# Patient Record
Sex: Female | Born: 1985 | Race: Black or African American | Hispanic: No | Marital: Single | Smoking: Former smoker
Health system: Southern US, Community
[De-identification: ages and names within clinical notes are randomized; demographics above are authoritative.]

## PROBLEM LIST (undated history)

## (undated) ENCOUNTER — Inpatient Hospital Stay (HOSPITAL_COMMUNITY): Payer: Self-pay

## (undated) DIAGNOSIS — R519 Headache, unspecified: Secondary | ICD-10-CM

## (undated) DIAGNOSIS — Z3480 Encounter for supervision of other normal pregnancy, unspecified trimester: Secondary | ICD-10-CM

## (undated) DIAGNOSIS — R51 Headache: Secondary | ICD-10-CM

## (undated) DIAGNOSIS — O24419 Gestational diabetes mellitus in pregnancy, unspecified control: Secondary | ICD-10-CM

## (undated) DIAGNOSIS — K219 Gastro-esophageal reflux disease without esophagitis: Secondary | ICD-10-CM

## (undated) DIAGNOSIS — K469 Unspecified abdominal hernia without obstruction or gangrene: Secondary | ICD-10-CM

## (undated) DIAGNOSIS — B009 Herpesviral infection, unspecified: Secondary | ICD-10-CM

## (undated) HISTORY — DX: Herpesviral infection, unspecified: B00.9

## (undated) HISTORY — DX: Unspecified abdominal hernia without obstruction or gangrene: K46.9

## (undated) HISTORY — DX: Gestational diabetes mellitus in pregnancy, unspecified control: O24.419

## (undated) HISTORY — PX: WISDOM TOOTH EXTRACTION: SHX21

## (undated) HISTORY — DX: Encounter for supervision of other normal pregnancy, unspecified trimester: Z34.80

---
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
## 2005-04-07 ENCOUNTER — Inpatient Hospital Stay (HOSPITAL_COMMUNITY): Admission: AD | Admit: 2005-04-07 | Discharge: 2005-04-10 | Payer: Self-pay | Admitting: Obstetrics

## 2005-04-09 ENCOUNTER — Encounter (INDEPENDENT_AMBULATORY_CARE_PROVIDER_SITE_OTHER): Payer: Self-pay | Admitting: Specialist

## 2005-06-01 ENCOUNTER — Inpatient Hospital Stay (HOSPITAL_COMMUNITY): Admission: AD | Admit: 2005-06-01 | Discharge: 2005-06-01 | Payer: Self-pay | Admitting: Obstetrics and Gynecology

## 2005-06-02 ENCOUNTER — Inpatient Hospital Stay (HOSPITAL_COMMUNITY): Admission: AD | Admit: 2005-06-02 | Discharge: 2005-06-02 | Payer: Self-pay | Admitting: *Deleted

## 2006-04-20 ENCOUNTER — Inpatient Hospital Stay (HOSPITAL_COMMUNITY): Admission: AD | Admit: 2006-04-20 | Discharge: 2006-04-21 | Payer: Self-pay | Admitting: Obstetrics & Gynecology

## 2006-05-07 ENCOUNTER — Ambulatory Visit (HOSPITAL_COMMUNITY): Admission: RE | Admit: 2006-05-07 | Discharge: 2006-05-07 | Payer: Self-pay | Admitting: Obstetrics & Gynecology

## 2006-06-07 ENCOUNTER — Inpatient Hospital Stay (HOSPITAL_COMMUNITY): Admission: AD | Admit: 2006-06-07 | Discharge: 2006-06-07 | Payer: Self-pay | Admitting: Obstetrics

## 2006-06-07 ENCOUNTER — Inpatient Hospital Stay (HOSPITAL_COMMUNITY): Admission: AD | Admit: 2006-06-07 | Discharge: 2006-06-08 | Payer: Self-pay | Admitting: Obstetrics

## 2006-07-13 ENCOUNTER — Inpatient Hospital Stay (HOSPITAL_COMMUNITY): Admission: AD | Admit: 2006-07-13 | Discharge: 2006-07-13 | Payer: Self-pay | Admitting: Obstetrics

## 2006-08-01 ENCOUNTER — Inpatient Hospital Stay (HOSPITAL_COMMUNITY): Admission: AD | Admit: 2006-08-01 | Discharge: 2006-08-09 | Payer: Self-pay | Admitting: Gynecology

## 2006-11-30 ENCOUNTER — Inpatient Hospital Stay (HOSPITAL_COMMUNITY): Admission: AD | Admit: 2006-11-30 | Discharge: 2006-12-03 | Payer: Self-pay | Admitting: Obstetrics

## 2006-11-30 ENCOUNTER — Encounter: Payer: Self-pay | Admitting: Emergency Medicine

## 2006-12-05 ENCOUNTER — Ambulatory Visit (HOSPITAL_BASED_OUTPATIENT_CLINIC_OR_DEPARTMENT_OTHER): Admission: RE | Admit: 2006-12-05 | Discharge: 2006-12-05 | Payer: Self-pay | Admitting: Orthopedic Surgery

## 2006-12-18 HISTORY — PX: FOREARM FRACTURE SURGERY: SHX649

## 2007-09-18 ENCOUNTER — Emergency Department (HOSPITAL_COMMUNITY): Admission: EM | Admit: 2007-09-18 | Discharge: 2007-09-18 | Payer: Self-pay | Admitting: Family Medicine

## 2008-04-10 ENCOUNTER — Emergency Department (HOSPITAL_COMMUNITY): Admission: EM | Admit: 2008-04-10 | Discharge: 2008-04-10 | Payer: Self-pay | Admitting: Emergency Medicine

## 2008-07-10 ENCOUNTER — Inpatient Hospital Stay (HOSPITAL_COMMUNITY): Admission: AD | Admit: 2008-07-10 | Discharge: 2008-07-10 | Payer: Self-pay | Admitting: Gynecology

## 2008-07-13 ENCOUNTER — Inpatient Hospital Stay (HOSPITAL_COMMUNITY): Admission: AD | Admit: 2008-07-13 | Discharge: 2008-07-13 | Payer: Self-pay | Admitting: Family Medicine

## 2008-09-07 ENCOUNTER — Inpatient Hospital Stay (HOSPITAL_COMMUNITY): Admission: AD | Admit: 2008-09-07 | Discharge: 2008-09-07 | Payer: Self-pay | Admitting: Obstetrics

## 2008-09-22 ENCOUNTER — Emergency Department (HOSPITAL_COMMUNITY): Admission: EM | Admit: 2008-09-22 | Discharge: 2008-09-22 | Payer: Self-pay | Admitting: Emergency Medicine

## 2008-11-20 ENCOUNTER — Inpatient Hospital Stay (HOSPITAL_COMMUNITY): Admission: AD | Admit: 2008-11-20 | Discharge: 2008-11-20 | Payer: Self-pay | Admitting: Obstetrics

## 2009-02-09 ENCOUNTER — Inpatient Hospital Stay (HOSPITAL_COMMUNITY): Admission: AD | Admit: 2009-02-09 | Discharge: 2009-02-09 | Payer: Self-pay | Admitting: Obstetrics

## 2009-02-13 ENCOUNTER — Inpatient Hospital Stay (HOSPITAL_COMMUNITY): Admission: AD | Admit: 2009-02-13 | Discharge: 2009-02-13 | Payer: Self-pay | Admitting: Obstetrics

## 2009-03-11 ENCOUNTER — Inpatient Hospital Stay (HOSPITAL_COMMUNITY): Admission: RE | Admit: 2009-03-11 | Discharge: 2009-03-13 | Payer: Self-pay | Admitting: Obstetrics

## 2009-10-24 ENCOUNTER — Emergency Department (HOSPITAL_COMMUNITY): Admission: EM | Admit: 2009-10-24 | Discharge: 2009-10-24 | Payer: Self-pay | Admitting: Emergency Medicine

## 2009-12-18 DIAGNOSIS — K469 Unspecified abdominal hernia without obstruction or gangrene: Secondary | ICD-10-CM

## 2009-12-18 HISTORY — DX: Unspecified abdominal hernia without obstruction or gangrene: K46.9

## 2011-03-08 ENCOUNTER — Encounter: Payer: Self-pay | Admitting: Licensed Clinical Social Worker

## 2011-03-30 LAB — CCBB MATERNAL DONOR DRAW

## 2011-03-30 LAB — CBC
HCT: 29.1 % — ABNORMAL LOW (ref 36.0–46.0)
HCT: 32.4 % — ABNORMAL LOW (ref 36.0–46.0)
Hemoglobin: 10.6 g/dL — ABNORMAL LOW (ref 12.0–15.0)
Hemoglobin: 9.4 g/dL — ABNORMAL LOW (ref 12.0–15.0)
MCHC: 32.3 g/dL (ref 30.0–36.0)
MCHC: 32.6 g/dL (ref 30.0–36.0)
MCV: 87.2 fL (ref 78.0–100.0)
MCV: 88.2 fL (ref 78.0–100.0)
Platelets: 152 10*3/uL (ref 150–400)
Platelets: 201 10*3/uL (ref 150–400)
RBC: 3.3 MIL/uL — ABNORMAL LOW (ref 3.87–5.11)
RBC: 3.72 MIL/uL — ABNORMAL LOW (ref 3.87–5.11)
RDW: 13.2 % (ref 11.5–15.5)
RDW: 13.3 % (ref 11.5–15.5)
WBC: 8.9 10*3/uL (ref 4.0–10.5)
WBC: 9.2 10*3/uL (ref 4.0–10.5)

## 2011-03-30 LAB — RPR: RPR Ser Ql: NONREACTIVE

## 2011-04-04 LAB — URINALYSIS, ROUTINE W REFLEX MICROSCOPIC
Bilirubin Urine: NEGATIVE
Glucose, UA: NEGATIVE mg/dL
Hgb urine dipstick: NEGATIVE
Ketones, ur: NEGATIVE mg/dL
Nitrite: NEGATIVE
Protein, ur: NEGATIVE mg/dL
Specific Gravity, Urine: 1.01 (ref 1.005–1.030)
Urobilinogen, UA: 0.2 mg/dL (ref 0.0–1.0)
pH: 6 (ref 5.0–8.0)

## 2011-05-05 NOTE — Discharge Summary (Signed)
[name], [name] NO.:  [id number]   MEDICAL RECORD NO.:  [id number]          PATIENT TYPE:  INP   LOCATION:  9307                          FACILITY:  [facility]   PHYSICIAN:  Kathreen Cosier, M.D.DATE OF BIRTH:  [birth year]   DATE OF ADMISSION:  08/01/2006  DATE OF DISCHARGE:  08/09/2006                                 DISCHARGE SUMMARY   The patient is 25 year old gravida 3, para 2-0-0-2, EDC December 22, 2006, [redacted]  weeks pregnant, with a history of pyelonephritis.  She was admitted with a  temperature of 101,  pulse 120, and CVA tenderness.  Urine was positive.  She was treated with IV ampicillin and gentamicin.  Blood cultures grew out  gram-negative rods, E. coli.  The patient received IV ampicillin and  gentamicin until August 23, when she was discharged home on Cipro 500 mg  q.12h.  On admission the hemoglobin was 10.3, white count 11.6, platelets  226.  Repeat white count 8.7, hemoglobin 8.6.  Sodium 135, potassium 3.9,  chloride 102, glucose 83, creatinine 0.6.  Hepatitis negative.  RPR  negative.   DISCHARGE DIAGNOSES:  1. Status post intrauterine pregnancy at 19 weeks.  2. Pyelonephritis.  3. Positive blood culture.           ______________________________  Kathreen Cosier, M.D.     BAM/MEDQ  D:  08/29/2006  T:  08/29/2006  Job:  [id number]
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
## 2011-05-05 NOTE — Discharge Summary (Signed)
   NAME:  Leah Walls, Leah Walls                          ACCOUNT NO.:  [id number]   MEDICAL RECORD NO.:  [id number]                   PATIENT TYPE:  INP   LOCATION:  9138                                 FACILITY:  [facility]   PHYSICIAN:  Maurice March, M.D.                  DATE OF BIRTH:  [birth year]   DATE OF ADMISSION:  01/31/2003  DATE OF DISCHARGE:  02/04/2003                                 DISCHARGE SUMMARY   ADMISSION DIAGNOSES:  1. The patient is a 25 year old gravida 1 at 40-4/7 weeks with spontaneous     rupture of membranes.  2. Group B Streptococcus positive.   DISCHARGE DIAGNOSES:  1. The patient is a 25 year old gravida 1 para 1 on postoperative day #3     status post low transverse cesarean section for failure to dilate.  2. Anemia.  3. Viable female with Apgars of 2 at 1 minute and 9 at 5 minutes.   DISCHARGE MEDICATIONS:  1. Ibuprofen 600 mg p.o. q.6h. p.r.n.  2. Percocet 5/325 mg 1 to 2 tablets p.o. q.4-6h. p.r.n.  3. Ortho Evra patch 1 patch applied weekly as directed.  4. Iron sulfate 325 mg p.o. b.i.d.  5. Colace 100 mg p.o. daily.   HISTORY:  The patient is a 25 year old G1 who presented at 40-4/7 weeks with  spontaneous rupture of membranes. She was noted to be GBS positive.  She was  admitted to labor and delivery and started on penicillin for GBS  prophylaxis.   HOSPITAL COURSE:  She had a very slow labor and dilated to 4 to 5 cm.  She  then had arrest of dilatation and was taken for a low transverse cesarean  section.  She delivered a viable female with Apgars of 2 at 1 minute and 9 at  5 minutes.  Cord pH was 7.3.   The patient had a normal postpartum course.  She was noted to be anemic  postpartum with a hemoglobin of 7.0.  She was started on iron for the  remainder of her hospital stay.   Her female infant was circumcised before discharge.  She requested the Ortho  Evra patch for contraception.    DISCHARGE INSTRUCTIONS:  1. The patient was told of her above  medical regimen.  2. She was also told of her followup appointment at [facility] in six     weeks.   CONDITION ON DISCHARGE:  The patient was discharged to home in stable  condition.                                                 Maurice March, M.D.    LC/MEDQ  D:  02/04/2003  T:  02/04/2003  Job:  [id number]
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
## 2011-05-05 NOTE — Op Note (Signed)
NAME:  Leah Walls, Zahn                           ACCOUNT NO.:  [id number]   MEDICAL RECORD NO.:  [id number]                   PATIENT TYPE:   LOCATION:                                       FACILITY:   PHYSICIAN:  Phil D. Okey Dupre, M.D.                  DATE OF BIRTH:   DATE OF PROCEDURE:  02/01/2003  DATE OF DISCHARGE:                                 OPERATIVE REPORT   PROCEDURE:  Low transverse cesarean section.   PREOPERATIVE DIAGNOSIS:  Cephalopelvic disproportion, persistent posterior.   POSTOPERATIVE DIAGNOSIS:  Cephalopelvic disproportion, persistent posterior.   SURGEON:  Javier Glazier. Okey Dupre, M.D.   ANESTHESIA:  Epidural.   DESCRIPTION OF PROCEDURE:  Under satisfactory epidural anesthesia with the  patient in the dorsal supine position and a Foley catheter in the urinary  bladder, the abdomen was prepped and draped in the usual sterile manner.  The abdomen was entered through a Pfannenstiel incision situated 3 cm above  the symphysis pubis and extending for a total length of 16 cm.  The abdomen  was entered by layers and into the peritoneal cavity. The visceral  peritoneum and anterior surface of the uterus were opened transversely using  both sharp and blunt dissection.  The baby was presenting over the direct  OP.  The head was crammed in the pelvis and had to be flexed to bring it out  of the pelvis.  Difficulty with spontaneous delivery attempt was made with  Simpson forceps with the head being flexed and ultimately delivered that  way.  Once again, an attempt was made with a vacuum which would not remain  on the occiput.  At this point the baby's hand came out of the incision and  was placed back in.  I manually turned the head to the LOT presentation,  placed a posterior bladed forceps and the head was at this point easily  delivered.  The infant was delivered and the cord was doubly clamped,  divided and the baby handed to the pediatrician.  This was a female infant  with a  7.3 pH and an Apgar of 2 and 9.  Weight is unavailable as yet.  The  placenta was manually removed.  The uterus was explored and closed with  continuous running locked 0 Vicryl in an atraumatic manner and observed for  bleeding and none was noted.  The fascia was closed and the incision which  was made in a continuous alternating locked 0 Vicryl in an atraumatic  manner.  The skin edges were approximated with skin staples.  A dry sterile  dressing was applied.  Total blood loss from the procedure was 600 ml.  The  patient tolerated the  procedure well and was transferred to the recovery  room in satisfactory condition.  Phil D. Okey Dupre, M.D.    PDR/MEDQ  D:  02/01/2003  T:  02/01/2003  Job:  [id number]
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
## 2011-05-05 NOTE — Discharge Summary (Signed)
NAME:  Leah Walls, Leah Walls NO.:  [id number]   MEDICAL RECORD NO.:  [id number]                   PATIENT TYPE:  INP   LOCATION:  0102                                 [facility]   PHYSICIAN:  Cindie Crumbly, M.D.               DATE OF BIRTH:  [birth year]   DATE OF ADMISSION:  04/29/2003  DATE OF DISCHARGE:  05/05/2003                                 DISCHARGE SUMMARY   REASON FOR ADMISSION:  This 25 year old, African-American female was  admitted complaining of depression, status post overdose as a suicide  attempt.  For further history of present illness, please see the patient's  psychiatric admission assessment.   PHYSICAL EXAMINATION:  At the time of admission was significant for urinary  tract infection for which she was taking Macrobid and history of a cesarean  section 3 months prior to this admission.  She had an otherwise unremarkable  physical exam.   LABORATORY EXAMINATION:  The patient underwent laboratory workup to rule out  any other medical problems contributing to her symptomatology.  Urine probe  for gonorrhea and Chlamydia was positive for Chlamydia and the patient was  treated with Zithromax one gram p.o.  She tolerated this well without side  effects.  Urine probe for gonorrhea was negative.  TSH and free T4 were  unremarkable.  Hepatic panel was within normal limits.  GGT was within  normal limits.  RPR was non reactive.  CBC was within normal limits.  Basic  metabolic panel showed a potassium of 3.4 and was otherwise unremarkable.  The patient's urine drug screen was positive for metabolites of cannabis and  amphetamines.   HOSPITAL COURSE:  The patient received no x-rays, no special procedures, no  additional consultation.  She sustained no complications during the course  of this hospitalization.  On admission, she was psychomotor agitated, affect  and mood were depressed, irritable, and angry.  She displayed poor impulse  control and decreased concentration.  She refused a trial of antidepressant  medication.  She has been superficial, guarded, continues to be gamy and  manipulative, and continues to confabulate with regard to her story.  She  denies any use of amphetamines or cannabis, though her urine drug screen was  positive for this.  She remains in denial of any chemical dependency issues.  At the time of discharge, she denies any homicidal or suicidal ideation and  has done so throughout her admission.  She displayed no evidence of a  thought disorder throughout her hospital course.  As she no longer appears  to be a danger to herself or others and has refused all trials of  antidepressant medication or other psychotropic medications, it is felt that  she has reached her maximum benefits of hospitalization and is ready for  discharge to a less restrictive alternative setting.  Her condition on  discharge improved.   DIAGNOSES ACCORDING TO  DSM-IV:   AXIS I:  1. Major depression, single episode, severe without psychosis.  2. Rule out substance induced mood disorder.  3. Cannabis dependence.  4. Rule out polysubstance dependence.   AXIS II:  1. Rule out personality disorder, not otherwise specified.  2. Rule out learning disorder, not otherwise specified.    AXIS III:  1. Urinary tract infection (treated).  2. Chlamydia (treated).   AXIS IV:  Current psychosocial stressors are severe.   AXIS V:  Code 20 on admission, code 30 on discharge.   FURTHER EVALUATION AND TREATMENT RECOMMENDATIONS:  The patient is discharged  to home.  She is discharged on an unrestricted level of activity and a  regular diet.  She is discharged on no medications.  She will follow up with  her outpatient psychiatrist for all further aspects of her psychiatric care  at the [facility] and consequently I will sign off  on the case at this time.  She will follow up with her primary care   physician for all further aspects of her medical care.                                               Cindie Crumbly, M.D.    TS/MEDQ  D:  05/05/2003  T:  05/05/2003  Job:  [id number]

## 2011-09-12 LAB — POCT URINALYSIS DIP (DEVICE)
Bilirubin Urine: NEGATIVE
Glucose, UA: NEGATIVE
Ketones, ur: NEGATIVE
Nitrite: NEGATIVE
Operator id: 247071
Protein, ur: 30 — AB
Specific Gravity, Urine: 1.025
Urobilinogen, UA: 0.2
pH: 6

## 2011-09-12 LAB — WET PREP, GENITAL
Trich, Wet Prep: NONE SEEN
Yeast Wet Prep HPF POC: NONE SEEN

## 2011-09-12 LAB — POCT PREGNANCY, URINE
Operator id: 247071
Preg Test, Ur: NEGATIVE

## 2011-09-12 LAB — GC/CHLAMYDIA PROBE AMP, GENITAL
Chlamydia, DNA Probe: NEGATIVE
GC Probe Amp, Genital: NEGATIVE

## 2011-09-15 LAB — CBC
HCT: 35.9 — ABNORMAL LOW
Hemoglobin: 11.8 — ABNORMAL LOW
MCHC: 32.8
MCV: 90.9
Platelets: 262
RBC: 3.95
RDW: 13.6
WBC: 8.9

## 2011-09-15 LAB — WET PREP, GENITAL
Trich, Wet Prep: NONE SEEN
Yeast Wet Prep HPF POC: NONE SEEN

## 2011-09-15 LAB — GC/CHLAMYDIA PROBE AMP, GENITAL
Chlamydia, DNA Probe: NEGATIVE
GC Probe Amp, Genital: NEGATIVE

## 2011-09-15 LAB — POCT PREGNANCY, URINE
Operator id: 120561
Preg Test, Ur: POSITIVE

## 2011-09-15 LAB — HCG, QUANTITATIVE, PREGNANCY: hCG, Beta Chain, Quant, S: 6359 — ABNORMAL HIGH

## 2011-09-21 LAB — COMPREHENSIVE METABOLIC PANEL
ALT: 8 U/L (ref 0–35)
AST: 13 U/L (ref 0–37)
Albumin: 3.4 g/dL — ABNORMAL LOW (ref 3.5–5.2)
Alkaline Phosphatase: 48 U/L (ref 39–117)
BUN: 5 mg/dL — ABNORMAL LOW (ref 6–23)
CO2: 25 mEq/L (ref 19–32)
Calcium: 8.9 mg/dL (ref 8.4–10.5)
Chloride: 101 mEq/L (ref 96–112)
Creatinine, Ser: 0.45 mg/dL (ref 0.4–1.2)
GFR calc Af Amer: >60 mL/min (ref >60)
GFR calc non Af Amer: >60 mL/min (ref >60)
Glucose, Bld: 77 mg/dL (ref 70–99)
Potassium: 3.7 mEq/L (ref 3.5–5.1)
Sodium: 132 mEq/L — ABNORMAL LOW (ref 135–145)
Total Bilirubin: 0.4 mg/dL (ref 0.3–1.2)
Total Protein: 7.1 g/dL (ref 6.0–8.3)

## 2011-09-21 LAB — URINALYSIS, ROUTINE W REFLEX MICROSCOPIC
Bilirubin Urine: NEGATIVE
Glucose, UA: NEGATIVE mg/dL
Hgb urine dipstick: NEGATIVE
Ketones, ur: NEGATIVE mg/dL
Nitrite: NEGATIVE
Protein, ur: NEGATIVE mg/dL
Specific Gravity, Urine: 1.015 (ref 1.005–1.030)
Urobilinogen, UA: 0.2 mg/dL (ref 0.0–1.0)
pH: 7.5 (ref 5.0–8.0)

## 2011-09-21 LAB — CBC
HCT: 33.2 % — ABNORMAL LOW (ref 36.0–46.0)
Hemoglobin: 11 g/dL — ABNORMAL LOW (ref 12.0–15.0)
MCHC: 33 g/dL (ref 30.0–36.0)
MCV: 90.4 fL (ref 78.0–100.0)
Platelets: 231 10*3/uL (ref 150–400)
RBC: 3.67 MIL/uL — ABNORMAL LOW (ref 3.87–5.11)
RDW: 13.2 % (ref 11.5–15.5)
WBC: 9.5 10*3/uL (ref 4.0–10.5)

## 2011-09-28 LAB — POCT PREGNANCY, URINE
Operator id: 247071
Preg Test, Ur: NEGATIVE

## 2011-09-28 LAB — POCT URINALYSIS DIP (DEVICE)
Bilirubin Urine: NEGATIVE
Glucose, UA: NEGATIVE
Hgb urine dipstick: NEGATIVE
Nitrite: NEGATIVE
Operator id: 239701
Protein, ur: NEGATIVE
Specific Gravity, Urine: 1.02
Urobilinogen, UA: 0.2
pH: 6

## 2011-09-28 LAB — WET PREP, GENITAL
Trich, Wet Prep: NONE SEEN
Yeast Wet Prep HPF POC: NONE SEEN

## 2011-09-28 LAB — GC/CHLAMYDIA PROBE AMP, GENITAL
Chlamydia, DNA Probe: NEGATIVE
GC Probe Amp, Genital: POSITIVE — AB

## 2012-02-09 ENCOUNTER — Emergency Department (INDEPENDENT_AMBULATORY_CARE_PROVIDER_SITE_OTHER)
Admission: EM | Admit: 2012-02-09 | Discharge: 2012-02-09 | Disposition: A | Discharge status: Home / Self Care | Payer: Self-pay | Source: Home / Self Care | Attending: Emergency Medicine | Admitting: Emergency Medicine

## 2012-02-09 ENCOUNTER — Encounter (HOSPITAL_COMMUNITY): Payer: Self-pay

## 2012-02-09 ENCOUNTER — Emergency Department (HOSPITAL_COMMUNITY)
Admission: EM | Admit: 2012-02-09 | Discharge: 2012-02-09 | Discharge status: Against Medical Advice | Payer: Medicaid Other | Attending: Emergency Medicine | Admitting: Emergency Medicine

## 2012-02-09 DIAGNOSIS — H9209 Otalgia, unspecified ear: Secondary | ICD-10-CM | POA: Insufficient documentation

## 2012-02-09 DIAGNOSIS — H6690 Otitis media, unspecified, unspecified ear: Secondary | ICD-10-CM

## 2012-02-09 MED ORDER — HYDROCODONE-ACETAMINOPHEN 5-325 MG PO TABS
2.0000 | ORAL_TABLET | Freq: Once | ORAL | Status: AC
  Administered 2012-02-09: 2 via ORAL

## 2012-02-09 MED ORDER — AMOXICILLIN-POT CLAVULANATE 875-125 MG PO TABS: 1.0000 | ORAL_TABLET | Freq: Two times a day (BID) | ORAL | Status: AC

## 2012-02-09 MED ORDER — ANTIPYRINE-BENZOCAINE 5.4-1.4 % OT SOLN
3.0000 [drp] | Freq: Once | OTIC | Status: AC
  Administered 2012-02-09: 3 [drp] via OTIC

## 2012-02-09 MED ORDER — OXYCODONE-ACETAMINOPHEN 5-325 MG PO TABS: ORAL_TABLET | ORAL | Status: AC

## 2012-02-09 MED ORDER — HYDROCODONE-ACETAMINOPHEN 5-325 MG PO TABS
ORAL_TABLET | ORAL | Status: AC
  Filled 2012-02-09: qty 2

## 2012-02-09 NOTE — Discharge Instructions (Signed)

## 2012-02-09 NOTE — ED Provider Notes (Signed)
Chief Complaint  Patient presents with  . Otalgia    History of Present Illness:   The patient is a 26 year old female who has had a 3 hour history of right ear pain. She denies any injury to the ear or drainage. The left ear seems to be normal. She hasn't had any nasal congestion, rhinorrhea, sore throat, fever, or cough. No prior history of ear infections in the past.  Review of Systems:  Other than noted above, the patient denies any of the following symptoms. Systemic:  No fever, chills, sweats, fatigue, myalgias, headache, or anorexia. Eye:  No redness, pain or drainage. ENT:  No earache, nasal congestion, rhinorrhea, sinus pressure, or sore throat. Lungs:  No cough, sputum production, wheezing, shortness of breath. Or chest pain. GI:  No nausea, vomiting, abdominal pain or diarrhea. Skin:  No rash or itching.  PMFSH:  Past medical history, family history, social history, meds, and allergies were reviewed.  Physical Exam:   Vital signs:  BP 111/72  Pulse 97  Temp(Src) 97.9 F (36.6 C) (Oral)  Resp 28  SpO2 97% General:  Alert, in marked distress. Eye:  No conjunctival injection or drainage. ENT:  The right TM was, inflamed, and dull with fluid behind it. The canal is mildly erythematous the left TM and canal are normal.  Nasal mucosa was clear and uncongested, without drainage.  Mucous membranes were moist.  Pharynx was clear, without exudate or drainage.  There were no oral ulcerations or lesions. Neck:  Supple, no adenopathy, tenderness or mass. Lungs:  No respiratory distress.  Lungs were clear to auscultation, without wheezes, rales or rhonchi.  Breath sounds were clear and equal bilaterally. Heart:  Regular rhythm, without gallops, murmers or rubs. Skin:  Clear, warm, and dry, without rash or lesions.  Assessment:   Diagnoses that have been ruled out:  None  Diagnoses that are still under consideration:  None  Final diagnoses:  Otitis media      Plan:   1.  The  following meds were prescribed:   New Prescriptions   AMOXICILLIN-CLAVULANATE (AUGMENTIN) 875-125 MG PER TABLET    Take 1 tablet by mouth 2 (two) times daily.   OXYCODONE-ACETAMINOPHEN (PERCOCET) 5-325 MG PER TABLET    1 to 2 tablets every 6 hours as needed for pain.   2.  The patient was instructed in symptomatic care and handouts were given. 3.  The patient was told to return if becoming worse in any way, if no better in 3 or 4 days, and given some red flag symptoms that would indicate earlier return.   Roque Lias, MD 02/09/12 2130

## 2012-02-09 NOTE — ED Notes (Signed)
Pt c/o (R) ear pain x2 hours, pt yelling, demanding someone come in to room right now, and hyperventilating, O2 sats 99% ra

## 2012-02-09 NOTE — ED Notes (Signed)
Patient is resting comfortably. 

## 2012-02-09 NOTE — ED Notes (Signed)
Called patient from main waiting with no response x 1.

## 2012-02-09 NOTE — ED Notes (Signed)
States her ear feels better; advised to fill antibiotics , and start tonight , and finish as written

## 2012-02-09 NOTE — ED Notes (Signed)
Family at bedside. 

## 2012-02-09 NOTE — ED Notes (Signed)
Reported ear pain x 1 hour PTA, denies injury ; crying, agitated, hypervetilating

## 2012-10-30 ENCOUNTER — Encounter (HOSPITAL_COMMUNITY): Payer: Self-pay

## 2012-10-30 ENCOUNTER — Emergency Department (HOSPITAL_COMMUNITY)
Admission: EM | Admit: 2012-10-30 | Discharge: 2012-10-30 | Disposition: A | Discharge status: Home / Self Care | Payer: Medicaid Other | Source: Home / Self Care | Attending: Emergency Medicine | Admitting: Emergency Medicine

## 2012-10-30 DIAGNOSIS — R42 Dizziness and giddiness: Secondary | ICD-10-CM

## 2012-10-30 LAB — POCT URINALYSIS DIP (DEVICE)
Bilirubin Urine: NEGATIVE
Glucose, UA: NEGATIVE mg/dL
Hgb urine dipstick: NEGATIVE
Ketones, ur: NEGATIVE mg/dL
Leukocytes, UA: NEGATIVE
Nitrite: NEGATIVE
Protein, ur: NEGATIVE mg/dL
Specific Gravity, Urine: 1.02 (ref 1.005–1.030)
Urobilinogen, UA: 0.2 mg/dL (ref 0.0–1.0)
pH: 6 (ref 5.0–8.0)

## 2012-10-30 LAB — POCT I-STAT, CHEM 8
BUN: 10 mg/dL (ref 6–23)
Calcium, Ion: 1.27 mmol/L — ABNORMAL HIGH (ref 1.12–1.23)
Chloride: 103 mEq/L (ref 96–112)
Creatinine, Ser: 1 mg/dL (ref 0.50–1.10)
Glucose, Bld: 95 mg/dL (ref 70–99)
HCT: 40 % (ref 36.0–46.0)
Hemoglobin: 13.6 g/dL (ref 12.0–15.0)
Potassium: 3.7 mEq/L (ref 3.5–5.1)
Sodium: 140 mEq/L (ref 135–145)
TCO2: 24 mmol/L (ref 0–100)

## 2012-10-30 LAB — POCT PREGNANCY, URINE: Preg Test, Ur: NEGATIVE

## 2012-10-30 NOTE — ED Provider Notes (Signed)
History     CSN: [id number]  Arrival date & time 10/30/12  4098   First MD Initiated Contact with Patient 10/30/12 1005      Chief Complaint  Patient presents with  . Dizziness    (Consider location/radiation/quality/duration/timing/severity/associated sxs/prior treatment) HPI Comments: Patient presents urgent care this morning complaining of intermittent symptoms such as dizziness headaches and the sensation that she is about to pass out for the last 2 weeks. She denies any fevers, abdominal pains, vaginal bleeding, or further neurological symptoms such as paresthesias or weakness. She does however seem very concerned that she has an Implanon that has expired and she has been unable to make an appointment to see her local gynecologist. She has also been having unprotected sex and wants to make sure she is not pregnant. Patient also denies any shortness of breath, chest pains or palpitations.  The history is provided by the patient.    History reviewed. No pertinent past medical history.  History reviewed. No pertinent past surgical history.  History reviewed. No pertinent family history.  History  Substance Use Topics  . Smoking status: Not on file  . Smokeless tobacco: Not on file  . Alcohol Use: Not on file    OB History    Grav Para Term Preterm Abortions TAB SAB Ect Mult Living                  Review of Systems  Constitutional: Negative for fever, chills, diaphoresis, activity change, appetite change and fatigue.  HENT: Negative for neck stiffness.   Respiratory: Negative for shortness of breath.   Cardiovascular: Negative for chest pain and leg swelling.  Neurological: Positive for dizziness and headaches. Negative for tremors, facial asymmetry, speech difficulty, weakness and numbness.    Allergies  Review of patient's allergies indicates no known allergies.  Home Medications  No current outpatient prescriptions on file.  BP 113/62  Pulse 82  Temp 98.2  F (36.8 C) (Oral)  Resp 16  SpO2 100%  LMP 05/30/2012  Physical Exam  Nursing note and vitals reviewed. Constitutional: She is oriented to person, place, and time. Vital signs are normal. She appears well-developed and well-nourished.  Non-toxic appearance. She does not have a sickly appearance. She does not appear ill. No distress.  HENT:  Head: Normocephalic.  Eyes: Pupils are equal, round, and reactive to light.  Neck: Normal range of motion. No JVD present.  Cardiovascular: Normal rate, regular rhythm, normal heart sounds and normal pulses.  Exam reveals no gallop and no friction rub.   No murmur heard. Pulmonary/Chest: Effort normal and breath sounds normal.  Neurological: She is alert and oriented to person, place, and time. She displays no atrophy and no tremor. No cranial nerve deficit or sensory deficit. She exhibits normal muscle tone. She displays no seizure activity. Coordination normal.  Skin: Skin is warm.    ED Course  Procedures (including critical care time)  Labs Reviewed  POCT I-STAT, CHEM 8 - Abnormal; Notable for the following:    Calcium, Ion 1.27 (*)     All other components within normal limits  POCT URINALYSIS DIP (DEVICE)  POCT PREGNANCY, URINE   No results found.   1. Dizziness     60 seconds of heart monitoring with handheld device, by myself reveal no visible or detectable cardiac arrhythmias. Normal sinus rhythm with a heart rate ranged from 65-72 beats per minute. MDM   Patient multi-symptomatic ( recurrent), with periods of feeling asymptomatic within the last  2 weeks. Patient had a remarkable neurological and cardiovascular exam with a normal rhythm strip. Electrolytes and hemoglobin and hematocrit were stable. Have encouraged her to make it a priority to remove her expired Implanon and to take precautions to avoid prevent pregnancy. She understands and will proceed to call her gynecologist today to set up an appointment. We have discussed what  symptoms should warrant further evaluation in the emergency department. Patient agrees with treatment plan follow-up care with her gynecologist, and what cardiovascular or neurological symptoms ( red flag symptoms) should warrant further evaluation in the emergency department.       Jimmie Molly, MD 10/30/12 1122

## 2012-10-30 NOTE — ED Notes (Signed)
C/o has been having dizzy spells, feeling like she is going to pass out, feeling bad for past 2 weeks. G4/P4, w c section x 1 , has implanon that has expired, sexually active w sporadic condoms ; has been having HA that was not relieved w motrin x1 dose
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
## 2012-12-13 ENCOUNTER — Telehealth: Payer: Self-pay | Admitting: Dietician

## 2012-12-13 NOTE — Telephone Encounter (Signed)
Opened in error

## 2013-06-26 ENCOUNTER — Encounter (HOSPITAL_COMMUNITY): Payer: Self-pay | Admitting: *Deleted

## 2013-06-26 ENCOUNTER — Inpatient Hospital Stay (HOSPITAL_COMMUNITY)
Admission: AD | Admit: 2013-06-26 | Discharge: 2013-06-26 | Disposition: A | Discharge status: Home / Self Care | Payer: Medicaid Other | Source: Ambulatory Visit | Attending: Obstetrics | Admitting: Obstetrics

## 2013-06-26 DIAGNOSIS — R110 Nausea: Secondary | ICD-10-CM

## 2013-06-26 DIAGNOSIS — N949 Unspecified condition associated with female genital organs and menstrual cycle: Secondary | ICD-10-CM | POA: Insufficient documentation

## 2013-06-26 DIAGNOSIS — R109 Unspecified abdominal pain: Secondary | ICD-10-CM

## 2013-06-26 DIAGNOSIS — N938 Other specified abnormal uterine and vaginal bleeding: Secondary | ICD-10-CM | POA: Insufficient documentation

## 2013-06-26 DIAGNOSIS — N925 Other specified irregular menstruation: Secondary | ICD-10-CM | POA: Insufficient documentation

## 2013-06-26 DIAGNOSIS — N939 Abnormal uterine and vaginal bleeding, unspecified: Secondary | ICD-10-CM

## 2013-06-26 LAB — CBC WITH DIFFERENTIAL/PLATELET
Basophils Absolute: 0 10*3/uL (ref 0.0–0.1)
Basophils Relative: 1 % (ref 0–1)
Eosinophils Absolute: 0.1 10*3/uL (ref 0.0–0.7)
Eosinophils Relative: 2 % (ref 0–5)
HCT: 32.6 % — ABNORMAL LOW (ref 36.0–46.0)
Hemoglobin: 10.5 g/dL — ABNORMAL LOW (ref 12.0–15.0)
Lymphocytes Relative: 49 % — ABNORMAL HIGH (ref 12–46)
Lymphs Abs: 2.7 10*3/uL (ref 0.7–4.0)
MCH: 28.8 pg (ref 26.0–34.0)
MCHC: 32.2 g/dL (ref 30.0–36.0)
MCV: 89.3 fL (ref 78.0–100.0)
Monocytes Absolute: 0.3 10*3/uL (ref 0.1–1.0)
Monocytes Relative: 5 % (ref 3–12)
Neutro Abs: 2.4 10*3/uL (ref 1.7–7.7)
Neutrophils Relative %: 44 % (ref 43–77)
Platelets: 223 10*3/uL (ref 150–400)
RBC: 3.65 MIL/uL — ABNORMAL LOW (ref 3.87–5.11)
RDW: 12.8 % (ref 11.5–15.5)
WBC: 5.5 10*3/uL (ref 4.0–10.5)

## 2013-06-26 LAB — URINALYSIS, ROUTINE W REFLEX MICROSCOPIC
Bilirubin Urine: NEGATIVE
Glucose, UA: NEGATIVE mg/dL
Hgb urine dipstick: NEGATIVE
Ketones, ur: 15 mg/dL — AB
Leukocytes, UA: NEGATIVE
Nitrite: NEGATIVE
Protein, ur: NEGATIVE mg/dL
Specific Gravity, Urine: >1.03 — ABNORMAL HIGH (ref 1.005–1.030)
Urobilinogen, UA: 0.2 mg/dL (ref 0.0–1.0)
pH: 6 (ref 5.0–8.0)

## 2013-06-26 LAB — POCT PREGNANCY, URINE: Preg Test, Ur: NEGATIVE

## 2013-06-26 NOTE — MAU Provider Note (Signed)
Attestation of Attending Supervision of Advanced Practitioner (PA/CNM/NP): Evaluation and management procedures were performed by the Advanced Practitioner under my supervision and collaboration.  I have reviewed the Advanced Practitioner's note and chart, and I agree with the management and plan.  Skyla Champagne, MD, FACOG Attending Obstetrician & Gynecologist Faculty Practice, [facility]  

## 2013-06-26 NOTE — MAU Note (Signed)
Patient states she had her Implanon removed about 1 month ago, states she had it for almost 4 years.

## 2013-06-26 NOTE — MAU Note (Signed)
Patient states she has been having nausea, vomiting and cramping since 7-4. Feels dizzy at times. Denies bleeding or discharge at this time. States she had spotting and discharge on 7-4.
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
## 2014-05-18 ENCOUNTER — Emergency Department (HOSPITAL_COMMUNITY)
Admission: EM | Admit: 2014-05-18 | Discharge: 2014-05-18 | Disposition: A | Discharge status: Home / Self Care | Payer: Medicaid Other | Attending: Emergency Medicine | Admitting: Emergency Medicine

## 2014-05-18 ENCOUNTER — Encounter (HOSPITAL_COMMUNITY): Payer: Self-pay | Admitting: Emergency Medicine

## 2014-05-18 DIAGNOSIS — L03317 Cellulitis of buttock: Principal | ICD-10-CM

## 2014-05-18 DIAGNOSIS — L0231 Cutaneous abscess of buttock: Secondary | ICD-10-CM

## 2014-05-18 DIAGNOSIS — F17200 Nicotine dependence, unspecified, uncomplicated: Secondary | ICD-10-CM | POA: Insufficient documentation

## 2014-05-18 MED ORDER — AMOXICILLIN-POT CLAVULANATE 875-125 MG PO TABS: 1.0000 | ORAL_TABLET | Freq: Two times a day (BID) | ORAL | Status: DC

## 2014-05-18 NOTE — ED Provider Notes (Signed)
CSN: [id number]     Arrival date & time 05/18/14  0516 History   First MD Initiated Contact with Patient 05/18/14 [phone]     Chief Complaint  Patient presents with  . boil      (Consider location/radiation/quality/duration/timing/severity/associated sxs/prior Treatment) The history is provided by the patient and medical records.   This is a 28 year old female with no significant past medical history presenting to the ED for a boil of her right buttock. She states has been present for approximately one week, thinks it developed after she shaved.  States she feels "pressure" in her right buttock, especially with sitting upright.  No prior hx of MRSA.  No fevers or chills.  Pt has been performing warm compresses but states "boil never came to a head" like her other ones so she got concerned.  Denies drainage from the area.  History reviewed. No pertinent past medical history. Past Surgical History  Procedure Laterality Date  . Cesarean section    . Forearm fracture surgery Right 2008   Family History  Problem Relation Age of Onset  . Hypertension Mother   . Hypertension Maternal Grandmother   . Hypertension Paternal Grandmother    History  Substance Use Topics  . Smoking status: Current Every Day Smoker    Types: Cigarettes  . Smokeless tobacco: Not on file  . Alcohol Use: No   OB History   Grav Para Term Preterm Abortions TAB SAB Ect Mult Living   4 4 3 1      3      Review of Systems  Skin:       boil  All other systems reviewed and are negative.     Allergies  Percocet  Home Medications   Prior to Admission medications   Medication Sig Start Date End Date Taking? Authorizing Provider  ibuprofen (ADVIL,MOTRIN) 200 MG tablet Take 200 mg by mouth every 6 (six) hours as needed for pain.   Yes Historical Provider, MD  diphenhydrAMINE (BENADRYL) 25 mg capsule Take 50 mg by mouth every 6 (six) hours as needed for itching.    Historical Provider, MD   BP 107/71  Pulse 71   Temp(Src) 98.1 F (36.7 C)  Resp 20  Ht 4\' 11"  (1.499 m)  Wt 120 lb (54.432 kg)  BMI 24.22 kg/m2  SpO2 97%  LMP 04/17/2014  Physical Exam  Nursing note and vitals reviewed. Constitutional: She is oriented to person, place, and time. She appears well-developed and well-nourished.  HENT:  Head: Normocephalic and atraumatic.  Mouth/Throat: Oropharynx is clear and moist.  Eyes: Conjunctivae and EOM are normal. Pupils are equal, round, and reactive to light.  Neck: Normal range of motion.  Cardiovascular: Normal rate, regular rhythm and normal heart sounds.   Pulmonary/Chest: Effort normal and breath sounds normal.  Genitourinary:     Small, right buttock abscess that is not directly peri-rectal; central fluctuance noted without drainage; no erythema or cellulitic change  Musculoskeletal: Normal range of motion.  Neurological: She is alert and oriented to person, place, and time.  Skin: Skin is warm and dry.  Psychiatric: She has a normal mood and affect.    ED Course  Procedures (including critical care time)  INCISION AND DRAINAGE Performed by: Garlon HatchetLisa M Llana Deshazo Consent: Verbal consent obtained. Risks and benefits: risks, benefits and alternatives were discussed Type: abscess  Body area: right buttock  Anesthesia: local infiltration  Incision was made with a scalpel.  Local anesthetic: lidocaine 1% without epinephrine  Anesthetic total:  4 ml  Complexity: complex Blunt dissection to break up loculations  Drainage: purulent  Drainage amount: large  Packing material: none  Patient tolerance: Patient tolerated the procedure well with no immediate complications.    Labs Review Labs Reviewed - No data to display  Imaging Review No results found.   EKG Interpretation None      MDM   Final diagnoses:  Abscess of right buttock   28 y.o. F with right buttock abscess, close to rectum but not directly peri-rectal.  Pt is afebrile and well appearing.  I&D  performed, small incision was made however pt stopped procedure before incision could be enlarged.  Nonetheless, she did have large amount of purulent drainage.  Given close proximity to rectum, will start on augmentin.  Encouraged warm compresses at home.  Discussed plan with patient, he/she acknowledged understanding and agreed with plan of care.  Return precautions given for new or worsening symptoms.  Garlon Hatchet, PA-C 05/18/14 [phone]

## 2014-05-18 NOTE — Discharge Instructions (Signed)
Abscess will continue to drain for the next few days, this is normal.  Take the prescribed medication as directed. Apply warm compresses 2-3 times daily for 20 minutes at a time-- this well help with healing and drainage. Return to the ED for new or worsening symptoms.

## 2014-05-18 NOTE — ED Notes (Signed)
Pt. Has an area on her right buttock just outside her perineum area. Some swelling noted. No redness but states it is tender to touch.

## 2014-05-18 NOTE — ED Provider Notes (Signed)
Medical screening examination/treatment/procedure(s) were performed by non-physician practitioner and as supervising physician I was immediately available for consultation/collaboration.   EKG Interpretation None       Olivia Mackie, MD 05/18/14 1801

## 2014-05-18 NOTE — ED Notes (Signed)
The pt has had a boil on her buttocks for one week  No temp  lmp may

## 2014-10-19 ENCOUNTER — Encounter (HOSPITAL_COMMUNITY): Payer: Self-pay | Admitting: Emergency Medicine

## 2014-11-15 ENCOUNTER — Encounter (HOSPITAL_COMMUNITY): Payer: Self-pay | Admitting: *Deleted

## 2014-11-15 ENCOUNTER — Inpatient Hospital Stay (HOSPITAL_COMMUNITY)
Admission: AD | Admit: 2014-11-15 | Discharge: 2014-11-15 | Disposition: A | Discharge status: Home / Self Care | Payer: Medicaid Other | Source: Ambulatory Visit | Attending: Obstetrics & Gynecology | Admitting: Obstetrics & Gynecology

## 2014-11-15 ENCOUNTER — Inpatient Hospital Stay (HOSPITAL_COMMUNITY): Payer: Medicaid Other

## 2014-11-15 DIAGNOSIS — O9989 Other specified diseases and conditions complicating pregnancy, childbirth and the puerperium: Secondary | ICD-10-CM

## 2014-11-15 DIAGNOSIS — O26891 Other specified pregnancy related conditions, first trimester: Secondary | ICD-10-CM | POA: Insufficient documentation

## 2014-11-15 DIAGNOSIS — Z3A01 Less than 8 weeks gestation of pregnancy: Secondary | ICD-10-CM | POA: Insufficient documentation

## 2014-11-15 DIAGNOSIS — R1030 Lower abdominal pain, unspecified: Secondary | ICD-10-CM | POA: Insufficient documentation

## 2014-11-15 DIAGNOSIS — Z3201 Encounter for pregnancy test, result positive: Secondary | ICD-10-CM | POA: Diagnosis present

## 2014-11-15 DIAGNOSIS — O99331 Smoking (tobacco) complicating pregnancy, first trimester: Secondary | ICD-10-CM | POA: Diagnosis not present

## 2014-11-15 DIAGNOSIS — O26899 Other specified pregnancy related conditions, unspecified trimester: Secondary | ICD-10-CM

## 2014-11-15 DIAGNOSIS — R109 Unspecified abdominal pain: Secondary | ICD-10-CM

## 2014-11-15 HISTORY — DX: Headache, unspecified: R51.9

## 2014-11-15 HISTORY — DX: Headache: R51

## 2014-11-15 LAB — URINALYSIS, ROUTINE W REFLEX MICROSCOPIC
Bilirubin Urine: NEGATIVE
Glucose, UA: NEGATIVE mg/dL
Ketones, ur: 15 mg/dL — AB
Leukocytes, UA: NEGATIVE
Nitrite: NEGATIVE
Protein, ur: NEGATIVE mg/dL
Specific Gravity, Urine: 1.01 (ref 1.005–1.030)
Urobilinogen, UA: 0.2 mg/dL (ref 0.0–1.0)
pH: 6 (ref 5.0–8.0)

## 2014-11-15 LAB — CBC
HCT: 34.1 % — ABNORMAL LOW (ref 36.0–46.0)
Hemoglobin: 11.3 g/dL — ABNORMAL LOW (ref 12.0–15.0)
MCH: 29.7 pg (ref 26.0–34.0)
MCHC: 33.1 g/dL (ref 30.0–36.0)
MCV: 89.7 fL (ref 78.0–100.0)
Platelets: 242 10*3/uL (ref 150–400)
RBC: 3.8 MIL/uL — ABNORMAL LOW (ref 3.87–5.11)
RDW: 12.8 % (ref 11.5–15.5)
WBC: 8.5 10*3/uL (ref 4.0–10.5)

## 2014-11-15 LAB — WET PREP, GENITAL
Trich, Wet Prep: NONE SEEN
Yeast Wet Prep HPF POC: NONE SEEN

## 2014-11-15 LAB — POCT PREGNANCY, URINE: Preg Test, Ur: POSITIVE — AB

## 2014-11-15 LAB — HIV ANTIBODY (ROUTINE TESTING W REFLEX): HIV 1&2 Ab, 4th Generation: NONREACTIVE

## 2014-11-15 LAB — RPR

## 2014-11-15 LAB — URINE MICROSCOPIC-ADD ON

## 2014-11-15 LAB — HCG, QUANTITATIVE, PREGNANCY: hCG, Beta Chain, Quant, S: 12812 m[IU]/mL — ABNORMAL HIGH (ref <5)

## 2014-11-15 LAB — ABO/RH: ABO/RH(D): O POS

## 2014-11-15 MED ORDER — PRENATAL VITAMINS PLUS 27-1 MG PO TABS: 1.0000 | ORAL_TABLET | Freq: Every day | ORAL | Status: DC

## 2014-11-15 NOTE — MAU Provider Note (Signed)
History     CSN: [id number]  Arrival date and time: 11/15/14 4098   First Provider Initiated Contact with Patient 11/15/14 1655      Chief Complaint  Patient presents with  . Abdominal Cramping   HPI Leah Walls 28 y.o. [redacted]w[redacted]d  Comes to MAU with cramping in the lower abdomen for one week.  Was surprised by the positive pregnancy test today.  Had Nexplanon removed in ? June, 2015 and was not using any other birth control as "she does not have sex that often".   OB History    Gravida Para Term Preterm AB TAB SAB Ectopic Multiple Living   5 4 3 1      4       Past Medical History  Diagnosis Date  . Headache     Past Surgical History  Procedure Laterality Date  . Cesarean section    . Forearm fracture surgery Right 2008  . Wisdom tooth extraction      Family History  Problem Relation Age of Onset  . Hypertension Mother   . Hypertension Maternal Grandmother   . Hypertension Paternal Grandmother     History  Substance Use Topics  . Smoking status: Current Every Day Smoker    Types: Cigarettes  . Smokeless tobacco: Not on file  . Alcohol Use: No    Allergies:  Allergies  Allergen Reactions  . Percocet [Oxycodone-Acetaminophen] Nausea And Vomiting    Can tolerate acetaminophen    Prescriptions prior to admission  Medication Sig Dispense Refill Last Dose  . amoxicillin-clavulanate (AUGMENTIN) 875-125 MG per tablet Take 1 tablet by mouth every 12 (twelve) hours. 20 tablet 0 More than a month at Unknown time  . diphenhydrAMINE (BENADRYL) 25 mg capsule Take 50 mg by mouth every 6 (six) hours as needed for itching.   More than a month at Unknown time  . ibuprofen (ADVIL,MOTRIN) 200 MG tablet Take 200 mg by mouth every 6 (six) hours as needed for pain.   More than a month at Unknown time    Review of Systems  Constitutional: Negative for fever.  Gastrointestinal: Positive for abdominal pain. Negative for nausea and vomiting.  Genitourinary:       No vaginal  discharge. No vaginal bleeding. No dysuria.   Physical Exam   Last menstrual period 10/06/2014. Uncertain date.   Physical Exam  Nursing note and vitals reviewed. Constitutional: She is oriented to person, place, and time. She appears well-developed and well-nourished.  HENT:  Head: Normocephalic.  Eyes: EOM are normal.  Neck: Neck supple.  GI: Soft. There is tenderness. There is no rebound and no guarding.  Very tender will palpation in low midline.  Had to go to the bathroom to urinate after palpating the lower abdomen.  Genitourinary:  Speculum exam: Vagina - Small amount of cloudy, pink discharge, no odor Cervix - No contact bleeding Bimanual exam: Cervix closed Uterus mildly tender, slightly enlarged Adnexa left side non tender, fullness palpated on right side approx 4 cm in diameter GC/Chlam, wet prep done Chaperone present for exam.  Musculoskeletal: Normal range of motion.  Neurological: She is alert and oriented to person, place, and time.  Skin: Skin is warm and dry.  Psychiatric: She has a normal mood and affect.    MAU Course  Procedures Results for orders placed or performed during the hospital encounter of 11/15/14 (from the past 24 hour(s))  Urinalysis, Routine w reflex microscopic     Status: Abnormal   Collection Time:  11/15/14  4:29 PM  Result Value Ref Range   Color, Urine YELLOW YELLOW   APPearance CLEAR CLEAR   Specific Gravity, Urine 1.010 1.005 - 1.030   pH 6.0 5.0 - 8.0   Glucose, UA NEGATIVE NEGATIVE mg/dL   Hgb urine dipstick TRACE (A) NEGATIVE   Bilirubin Urine NEGATIVE NEGATIVE   Ketones, ur 15 (A) NEGATIVE mg/dL   Protein, ur NEGATIVE NEGATIVE mg/dL   Urobilinogen, UA 0.2 0.0 - 1.0 mg/dL   Nitrite NEGATIVE NEGATIVE   Leukocytes, UA NEGATIVE NEGATIVE  Urine microscopic-add on     Status: Abnormal   Collection Time: 11/15/14  4:29 PM  Result Value Ref Range   Squamous Epithelial / LPF FEW (A) RARE   RBC / HPF 0-2 <3 RBC/hpf   Pregnancy, urine POC     Status: Abnormal   Collection Time: 11/15/14  4:32 PM  Result Value Ref Range   Preg Test, Ur POSITIVE (A) NEGATIVE  Wet prep, genital     Status: Abnormal   Collection Time: 11/15/14  5:00 PM  Result Value Ref Range   Yeast Wet Prep HPF POC NONE SEEN NONE SEEN   Trich, Wet Prep NONE SEEN NONE SEEN   Clue Cells Wet Prep HPF POC FEW (A) NONE SEEN   WBC, Wet Prep HPF POC RARE (A) NONE SEEN  hCG, quantitative, pregnancy     Status: Abnormal   Collection Time: 11/15/14  5:08 PM  Result Value Ref Range   hCG, Beta Chain, Quant, S 12812 (H) <5 mIU/mL  CBC     Status: Abnormal   Collection Time: 11/15/14  5:08 PM  Result Value Ref Range   WBC 8.5 4.0 - 10.5 K/uL   RBC 3.80 (L) 3.87 - 5.11 MIL/uL   Hemoglobin 11.3 (L) 12.0 - 15.0 g/dL   HCT 16.134.1 (L) 09.636.0 - 04.546.0 %   MCV 89.7 78.0 - 100.0 fL   MCH 29.7 26.0 - 34.0 pg   MCHC 33.1 30.0 - 36.0 g/dL   RDW 40.912.8 81.111.5 - 91.415.5 %   Platelets 242 150 - 400 K/uL  ABO/Rh     Status: None   Collection Time: 11/15/14  5:08 PM  Result Value Ref Range   ABO/RH(D) O POS    MDM   CLINICAL DATA: 28 year old pregnant female with right pelvic pain. Estimated gestational age of [redacted] weeks 5 days by LMP. Right adnexal fullness on clinical examination.  EXAM: OBSTETRIC <14 WK US AND TRANSVAGINAL OB US  TECHNIQUE: Both transabdominal and transvaginal ultrasound examinations were performed for complete evaluation of the gestation as well as the maternal uterus, adnexal regions, and pelvic cul-de-sac. Transvaginal technique was performed to assess early pregnancy.  COMPARISON: None  FINDINGS: Intrauterine gestational sac: Visualized/normal in shape.  Yolk sac: Visualized  Embryo: Not visualized  Cardiac Activity: Not visualized  MSD: 11.8 mm 5 w 6 d US EDC: 07/12/2015  Maternal uterus/adnexae:  A 5 x 4.1 x 4.5 cm simple appearing right ovarian cyst is noted.  The left ovary is  unremarkable.  Small amount of free pelvic fluid is identified.  There is no evidence of solid adnexal mass.  IMPRESSION: Single intrauterine gestational sac with yolk sac. Embryo not identified at this time. Estimated gestational age of [redacted] weeks 6 days by mean sac diameter.  5 x 4.1 x 4.5 cm simple appearing right ovarian cyst.  Small amount of free pelvic fluid.       Assessment and Plan  Early IUP Abdominal pain  in pregnancy  Plan Your pregnancy test is positive.  No smoking, no drugs, no alcohol.  Take a prenatal vitamin one by mouth every day.  Eat small frequent snacks to avoid nausea.  Begin prenatal care as soon as possible. Take Tylenol 325 mg 2 tablets by mouth every 4 hours if needed for pain. Drink at least 8 8-oz glasses of water every day.   Troy Kanouse 11/15/2014, 5:07 PM

## 2014-11-15 NOTE — MAU Note (Signed)
Pt not on birth control as of July 2015 when implant was removed.  LMP 06 Oct 2014;  c/o sharp, intermittent Lower, abd  Pain &  White, thin, milky discharge for the past week. Denies bleeding or pain with urination.

## 2014-11-15 NOTE — Discharge Instructions (Signed)
Your pregnancy test is positive.  No smoking, no drugs, no alcohol.  Take a prenatal vitamin one by mouth every day.  Eat small frequent snacks to avoid nausea.  Begin prenatal care as soon as possible. Take Tylenol 325 mg 2 tablets by mouth every 4 hours if needed for pain. Drink at least 8 8-oz glasses of water every day. Pick up prenatal vitamins from your pharmacy - prescription sent to your pharmacy.

## 2014-11-17 LAB — GC/CHLAMYDIA PROBE AMP
CT Probe RNA: NEGATIVE
GC Probe RNA: NEGATIVE

## 2014-12-09 LAB — OB RESULTS CONSOLE ABO/RH: RH Type: POSITIVE

## 2014-12-09 LAB — OB RESULTS CONSOLE RPR: RPR: NONREACTIVE

## 2014-12-09 LAB — OB RESULTS CONSOLE RUBELLA ANTIBODY, IGM: Rubella: IMMUNE

## 2014-12-09 LAB — OB RESULTS CONSOLE ANTIBODY SCREEN: Antibody Screen: NEGATIVE

## 2014-12-09 LAB — OB RESULTS CONSOLE HIV ANTIBODY (ROUTINE TESTING): HIV: NONREACTIVE

## 2014-12-09 LAB — OB RESULTS CONSOLE HEPATITIS B SURFACE ANTIGEN: Hepatitis B Surface Ag: NEGATIVE

## 2014-12-18 NOTE — L&D Delivery Note (Signed)
Delivery Note At 6:40 PM a viable female was delivered via Vaginal, Spontaneous Delivery (Presentation: Right Occiput Anterior).  APGAR: 9, 9; weight 6 lb 14.1 oz (3120 g).   Placenta status: Intact, Spontaneous.  Cord: 3 vessels with the following complications: None.  Cord pH: none  Anesthesia: Epidural  Episiotomy: None Lacerations: None Suture Repair: none Est. Blood Loss (mL): 125  Mom to postpartum.  Baby to Couplet care / Skin to Skin.  Earlisha Sharples A 07/07/2015, 9:21 PM

## 2015-02-12 LAB — US OB COMP + 14 WK

## 2015-02-16 ENCOUNTER — Inpatient Hospital Stay (HOSPITAL_COMMUNITY): Payer: Medicaid Other

## 2015-02-16 ENCOUNTER — Encounter (HOSPITAL_COMMUNITY): Payer: Self-pay | Admitting: *Deleted

## 2015-02-16 ENCOUNTER — Inpatient Hospital Stay (HOSPITAL_COMMUNITY)
Admission: AD | Admit: 2015-02-16 | Discharge: 2015-02-17 | Disposition: A | Discharge status: Home / Self Care | Payer: Medicaid Other | Source: Ambulatory Visit | Attending: Obstetrics | Admitting: Obstetrics

## 2015-02-16 DIAGNOSIS — O9989 Other specified diseases and conditions complicating pregnancy, childbirth and the puerperium: Secondary | ICD-10-CM

## 2015-02-16 DIAGNOSIS — O26899 Other specified pregnancy related conditions, unspecified trimester: Secondary | ICD-10-CM

## 2015-02-16 DIAGNOSIS — Z3A18 18 weeks gestation of pregnancy: Secondary | ICD-10-CM

## 2015-02-16 DIAGNOSIS — Z87891 Personal history of nicotine dependence: Secondary | ICD-10-CM | POA: Insufficient documentation

## 2015-02-16 DIAGNOSIS — Z3A19 19 weeks gestation of pregnancy: Secondary | ICD-10-CM | POA: Insufficient documentation

## 2015-02-16 DIAGNOSIS — R1032 Left lower quadrant pain: Secondary | ICD-10-CM | POA: Diagnosis not present

## 2015-02-16 DIAGNOSIS — R109 Unspecified abdominal pain: Secondary | ICD-10-CM

## 2015-02-16 DIAGNOSIS — S3981XA Other specified injuries of abdomen, initial encounter: Secondary | ICD-10-CM | POA: Insufficient documentation

## 2015-02-16 LAB — URINALYSIS, ROUTINE W REFLEX MICROSCOPIC
Bilirubin Urine: NEGATIVE
Glucose, UA: NEGATIVE mg/dL
Hgb urine dipstick: NEGATIVE
Ketones, ur: NEGATIVE mg/dL
Leukocytes, UA: NEGATIVE
Nitrite: NEGATIVE
Protein, ur: 100 mg/dL — AB
Specific Gravity, Urine: 1.025 (ref 1.005–1.030)
Urobilinogen, UA: 0.2 mg/dL (ref 0.0–1.0)
pH: 7.5 (ref 5.0–8.0)

## 2015-02-16 LAB — URINE MICROSCOPIC-ADD ON

## 2015-02-16 MED ORDER — CYCLOBENZAPRINE HCL 10 MG PO TABS
10.0000 mg | ORAL_TABLET | Freq: Once | ORAL | Status: AC
  Administered 2015-02-16: 10 mg via ORAL
  Filled 2015-02-16: qty 1

## 2015-02-16 MED ORDER — FAMOTIDINE 20 MG PO TABS
20.0000 mg | ORAL_TABLET | Freq: Once | ORAL | Status: AC
  Administered 2015-02-16: 20 mg via ORAL
  Filled 2015-02-16: qty 1

## 2015-02-16 MED ORDER — ACETAMINOPHEN-CODEINE #3 300-30 MG PO TABS: 1.0000 | ORAL_TABLET | Freq: Four times a day (QID) | ORAL | Status: DC | PRN
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
## 2015-02-16 NOTE — Discharge Instructions (Signed)
Abdominal Pain During Pregnancy Belly (abdominal) pain is common during pregnancy. Most of the time, it is not a serious problem. Other times, it can be a sign that something is wrong with the pregnancy. Always tell your doctor if you have belly pain. HOME CARE Monitor your belly pain for any changes. The following actions may help you feel better:  Do not have sex (intercourse) or put anything in your vagina until you feel better.  Rest until your pain stops.  Drink clear fluids if you feel sick to your stomach (nauseous). Do not eat solid food until you feel better.  Only take medicine as told by your doctor.  Keep all doctor visits as told. GET HELP RIGHT AWAY IF:   You are bleeding, leaking fluid, or pieces of tissue come out of your vagina.  You have more pain or cramping.  You keep throwing up (vomiting).  You have pain when you pee (urinate) or have blood in your pee.  You have a fever.  You do not feel your baby moving as much.  You feel very weak or feel like passing out.  You have trouble breathing, with or without belly pain.  You have a very bad headache and belly pain.  You have fluid leaking from your vagina and belly pain.  You keep having watery poop (diarrhea).  Your belly pain does not go away after resting, or the pain gets worse. MAKE SURE YOU:   Understand these instructions.  Will watch your condition.  Will get help right away if you are not doing well or get worse. Document Released: 11/22/2009 Document Revised: 08/06/2013 Document Reviewed: 07/03/2013 [facility] Patient Information 2015 [place], [place]. This information is not intended to replace advice given to you by your health care provider. Make sure you discuss any questions you have with your health care provider. What Do I Need to Know About Injuries During Pregnancy? Injuries can happen during pregnancy. Minor falls and accidents usually do not harm you or your baby. However, any  injury should be reported to your doctor. WHAT CAN I DO TO PROTECT MYSELF FROM INJURIES?  Remove rugs and loose objects on the floor.  Wear comfortable shoes that have a good grip. Do not wear high-heeled shoes.  Always wear your seat belt. The lap belt should be below your belly. Always practice safe driving.  Do not ride on a motorcycle.  Do not participate in high-impact activities or sports.  Avoid:  Walking on wet or slippery floors.  Fires.  Starting fires.  Lifting heavy pots of boiling or hot liquids.  Fixing electrical problems.  Only take medicine as told by your doctor.  Know your blood type and the blood type of the baby's father.  Call your local emergency services (911 in the [place] if you are a victim of domestic violence or assault. For help and support, contact the [employer] Domestic Violence Hotline. WHEN SHOULD I GET HELP RIGHT AWAY?  You fall on your belly or have any high-impact accident or injury.  You have been a victim of domestic violence or any kind of violence.  You have been in a car accident.  You have bleeding from your vagina.  Fluid is leaking from your vagina.  You start to have belly cramping (contractions) or pain.  You feel weak or pass out (faint).  You start to throw up (vomit) after an injury.  You have been burned.  You have a stiff neck or neck pain.  You  get a headache or have vision problems after an injury.  You do not feel the baby move or the baby is not moving as much as normal. Document Released: 01/06/2011 Document Revised: 04/20/2014 Document Reviewed: 09/10/2013 [facility] Patient Information 2015 [place], [place]. This information is not intended to replace advice given to you by your health care provider. Make sure you discuss any questions you have with your health care provider.
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
## 2015-02-17 DIAGNOSIS — O26899 Other specified pregnancy related conditions, unspecified trimester: Secondary | ICD-10-CM | POA: Insufficient documentation

## 2015-02-17 DIAGNOSIS — S3981XA Other specified injuries of abdomen, initial encounter: Secondary | ICD-10-CM | POA: Insufficient documentation

## 2015-02-17 DIAGNOSIS — R109 Unspecified abdominal pain: Secondary | ICD-10-CM

## 2015-02-17 DIAGNOSIS — Z3A19 19 weeks gestation of pregnancy: Secondary | ICD-10-CM | POA: Insufficient documentation

## 2015-05-10 ENCOUNTER — Other Ambulatory Visit (HOSPITAL_COMMUNITY): Payer: Self-pay | Admitting: Obstetrics

## 2015-05-10 DIAGNOSIS — O4453 Low lying placenta with hemorrhage, third trimester: Secondary | ICD-10-CM

## 2015-05-10 DIAGNOSIS — Z3A31 31 weeks gestation of pregnancy: Secondary | ICD-10-CM

## 2015-05-11 ENCOUNTER — Ambulatory Visit (HOSPITAL_COMMUNITY)
Admission: RE | Admit: 2015-05-11 | Discharge: 2015-05-11 | Disposition: A | Discharge status: Home / Self Care | Payer: Medicaid Other | Source: Ambulatory Visit | Attending: Obstetrics | Admitting: Obstetrics

## 2015-05-11 ENCOUNTER — Other Ambulatory Visit (HOSPITAL_COMMUNITY): Payer: Self-pay | Admitting: Obstetrics

## 2015-05-11 DIAGNOSIS — O4403 Placenta previa specified as without hemorrhage, third trimester: Secondary | ICD-10-CM | POA: Diagnosis not present

## 2015-05-11 DIAGNOSIS — O3421 Maternal care for scar from previous cesarean delivery: Secondary | ICD-10-CM | POA: Diagnosis present

## 2015-05-11 DIAGNOSIS — Z3A31 31 weeks gestation of pregnancy: Secondary | ICD-10-CM

## 2015-05-11 DIAGNOSIS — O4453 Low lying placenta with hemorrhage, third trimester: Secondary | ICD-10-CM

## 2015-05-12 ENCOUNTER — Encounter (HOSPITAL_COMMUNITY): Payer: Self-pay | Admitting: Obstetrics

## 2015-05-20 ENCOUNTER — Inpatient Hospital Stay (HOSPITAL_COMMUNITY)
Admission: AD | Admit: 2015-05-20 | Discharge: 2015-05-20 | Disposition: A | Discharge status: Home / Self Care | Payer: Medicaid Other | Source: Ambulatory Visit | Attending: Obstetrics | Admitting: Obstetrics

## 2015-05-20 ENCOUNTER — Encounter (HOSPITAL_COMMUNITY): Payer: Self-pay | Admitting: *Deleted

## 2015-05-20 DIAGNOSIS — O9A213 Injury, poisoning and certain other consequences of external causes complicating pregnancy, third trimester: Secondary | ICD-10-CM

## 2015-05-20 DIAGNOSIS — Z3A32 32 weeks gestation of pregnancy: Secondary | ICD-10-CM | POA: Diagnosis not present

## 2015-05-20 DIAGNOSIS — O4703 False labor before 37 completed weeks of gestation, third trimester: Secondary | ICD-10-CM

## 2015-05-20 DIAGNOSIS — Z886 Allergy status to analgesic agent status: Secondary | ICD-10-CM | POA: Insufficient documentation

## 2015-05-20 DIAGNOSIS — Z87891 Personal history of nicotine dependence: Secondary | ICD-10-CM | POA: Diagnosis not present

## 2015-05-20 DIAGNOSIS — S3991XA Unspecified injury of abdomen, initial encounter: Secondary | ICD-10-CM | POA: Diagnosis not present

## 2015-05-20 LAB — URINE MICROSCOPIC-ADD ON

## 2015-05-20 LAB — URINALYSIS, ROUTINE W REFLEX MICROSCOPIC
Bilirubin Urine: NEGATIVE
Glucose, UA: NEGATIVE mg/dL
Hgb urine dipstick: NEGATIVE
Ketones, ur: 15 mg/dL — AB
Leukocytes, UA: NEGATIVE
Nitrite: NEGATIVE
Protein, ur: 100 mg/dL — AB
Specific Gravity, Urine: 1.02 (ref 1.005–1.030)
Urobilinogen, UA: 0.2 mg/dL (ref 0.0–1.0)
pH: 7.5 (ref 5.0–8.0)

## 2015-05-20 MED ORDER — TERBUTALINE SULFATE 1 MG/ML IJ SOLN: 0.2500 mg | Freq: Once | INTRAMUSCULAR | Status: DC

## 2015-05-20 MED ORDER — ACETAMINOPHEN-CODEINE #3 300-30 MG PO TABS: 1.0000 | ORAL_TABLET | Freq: Four times a day (QID) | ORAL | Status: DC | PRN

## 2015-05-20 MED ORDER — TERBUTALINE SULFATE 1 MG/ML IJ SOLN
0.2500 mg | Freq: Once | INTRAMUSCULAR | Status: AC
  Administered 2015-05-20: 0.25 mg via SUBCUTANEOUS
  Filled 2015-05-20: qty 1

## 2015-05-20 MED ORDER — CYCLOBENZAPRINE HCL 10 MG PO TABS
10.0000 mg | ORAL_TABLET | Freq: Once | ORAL | Status: AC
  Administered 2015-05-20: 10 mg via ORAL
  Filled 2015-05-20: qty 1

## 2015-05-20 MED ORDER — ACETAMINOPHEN-CODEINE #3 300-30 MG PO TABS
2.0000 | ORAL_TABLET | Freq: Once | ORAL | Status: AC
  Administered 2015-05-20: 2 via ORAL
  Filled 2015-05-20: qty 2

## 2015-05-20 MED ORDER — LACTATED RINGERS IV BOLUS (SEPSIS)
1000.0000 mL | Freq: Once | INTRAVENOUS | Status: AC
  Administered 2015-05-20: 1000 mL via INTRAVENOUS

## 2015-05-20 NOTE — Discharge Instructions (Signed)
Braxton Hicks Contractions °Contractions of the uterus can occur throughout pregnancy. Contractions are not always a sign that you are in labor.  °WHAT ARE BRAXTON HICKS CONTRACTIONS?  °Contractions that occur before labor are called Braxton Hicks contractions, or false labor. Toward the end of pregnancy (32-34 weeks), these contractions can develop more often and may become more forceful. This is not true labor because these contractions do not result in opening (dilatation) and thinning of the cervix. They are sometimes difficult to tell apart from true labor because these contractions can be forceful and people have different pain tolerances. You should not feel embarrassed if you go to the hospital with false labor. Sometimes, the only way to tell if you are in true labor is for your health care provider to look for changes in the cervix. °If there are no prenatal problems or other health problems associated with the pregnancy, it is completely safe to be sent home with false labor and await the onset of true labor. °HOW CAN YOU TELL THE DIFFERENCE BETWEEN TRUE AND FALSE LABOR? °False Labor °· The contractions of false labor are usually shorter and not as hard as those of true labor.   °· The contractions are usually irregular.   °· The contractions are often felt in the front of the lower abdomen and in the groin.   °· The contractions may go away when you walk around or change positions while lying down.   °· The contractions get weaker and are shorter lasting as time goes on.   °· The contractions do not usually become progressively stronger, regular, and closer together as with true labor.   °True Labor °· Contractions in true labor last 30-70 seconds, become very regular, usually become more intense, and increase in frequency.   °· The contractions do not go away with walking.   °· The discomfort is usually felt in the top of the uterus and spreads to the lower abdomen and low back.   °· True labor can be  determined by your health care provider with an exam. This will show that the cervix is dilating and getting thinner.   °WHAT TO REMEMBER °· Keep up with your usual exercises and follow other instructions given by your health care provider.   °· Take medicines as directed by your health care provider.   °· Keep your regular prenatal appointments.   °· Eat and drink lightly if you think you are going into labor.   °· If Braxton Hicks contractions are making you uncomfortable:   °¨ Change your position from lying down or resting to walking, or from walking to resting.   °¨ Sit and rest in a tub of warm water.   °¨ Drink 2-3 glasses of water. Dehydration may cause these contractions.   °¨ Do slow and deep breathing several times an hour.   °WHEN SHOULD I SEEK IMMEDIATE MEDICAL CARE? °Seek immediate medical care if: °· Your contractions become stronger, more regular, and closer together.   °· You have fluid leaking or gushing from your vagina.   °· You have a fever.   °· You pass blood-tinged mucus.   °· You have vaginal bleeding.   °· You have continuous abdominal pain.   °· You have low back pain that you never had before.   °· You feel your baby's head pushing down and causing pelvic pressure.   °· Your baby is not moving as much as it used to.   °Document Released: 12/04/2005 Document Revised: 12/09/2013 Document Reviewed: 09/15/2013 °ExitCare® Patient Information ©2015 [employer], [employer]. This information is not intended to replace advice given to you by your health care   provider. Make sure you discuss any questions you have with your health care provider. What Do I Need to Know About Injuries During Pregnancy? Injuries can happen during pregnancy. Minor falls and accidents usually do not harm you or your baby. However, any injury should be reported to your doctor. WHAT CAN I DO TO PROTECT MYSELF FROM INJURIES?  Remove rugs and loose objects on the floor.  Wear comfortable shoes that have a good grip. Do not wear  high-heeled shoes.  Always wear your seat belt. The lap belt should be below your belly. Always practice safe driving.  Do not ride on a motorcycle.  Do not participate in high-impact activities or sports.  Avoid:  Walking on wet or slippery floors.  Fires.  Starting fires.  Lifting heavy pots of boiling or hot liquids.  Fixing electrical problems.  Only take medicine as told by your doctor.  Know your blood type and the blood type of the baby's father.  Call your local emergency services (911 in the [place] if you are a victim of domestic violence or assault. For help and support, contact the [employer] Domestic Violence Hotline. WHEN SHOULD I GET HELP RIGHT AWAY?  You fall on your belly or have any high-impact accident or injury.  You have been a victim of domestic violence or any kind of violence.  You have been in a car accident.  You have bleeding from your vagina.  Fluid is leaking from your vagina.  You start to have belly cramping (contractions) or pain.  You feel weak or pass out (faint).  You start to throw up (vomit) after an injury.  You have been burned.  You have a stiff neck or neck pain.  You get a headache or have vision problems after an injury.  You do not feel the baby move or the baby is not moving as much as normal. Document Released: 01/06/2011 Document Revised: 04/20/2014 Document Reviewed: 09/10/2013 [facility] Patient Information 2015 [place], [place]. This information is not intended to replace advice given to you by your health care provider. Make sure you discuss any questions you have with your health care provider.
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
## 2015-05-21 LAB — CULTURE, OB URINE: Colony Count: 70000

## 2015-06-07 LAB — OB RESULTS CONSOLE GC/CHLAMYDIA
Chlamydia: NEGATIVE
Gonorrhea: NEGATIVE

## 2015-06-14 LAB — OB RESULTS CONSOLE GBS: GBS: POSITIVE

## 2015-07-07 ENCOUNTER — Inpatient Hospital Stay (HOSPITAL_COMMUNITY): Payer: Medicaid Other | Admitting: Anesthesiology

## 2015-07-07 ENCOUNTER — Encounter (HOSPITAL_COMMUNITY): Payer: Self-pay | Admitting: *Deleted

## 2015-07-07 ENCOUNTER — Inpatient Hospital Stay (HOSPITAL_COMMUNITY)
Admission: AD | Admit: 2015-07-07 | Discharge: 2015-07-09 | DRG: 775 | Disposition: A | Discharge status: Home / Self Care | Payer: Medicaid Other | Source: Ambulatory Visit | Attending: Obstetrics | Admitting: Obstetrics

## 2015-07-07 DIAGNOSIS — Z3A39 39 weeks gestation of pregnancy: Secondary | ICD-10-CM | POA: Diagnosis present

## 2015-07-07 DIAGNOSIS — O99824 Streptococcus B carrier state complicating childbirth: Secondary | ICD-10-CM | POA: Diagnosis present

## 2015-07-07 DIAGNOSIS — O3421 Maternal care for scar from previous cesarean delivery: Secondary | ICD-10-CM | POA: Diagnosis present

## 2015-07-07 DIAGNOSIS — Z8249 Family history of ischemic heart disease and other diseases of the circulatory system: Secondary | ICD-10-CM | POA: Diagnosis not present

## 2015-07-07 DIAGNOSIS — Z87891 Personal history of nicotine dependence: Secondary | ICD-10-CM

## 2015-07-07 DIAGNOSIS — R200 Anesthesia of skin: Secondary | ICD-10-CM | POA: Diagnosis not present

## 2015-07-07 LAB — TYPE AND SCREEN
ABO/RH(D): O POS
Antibody Screen: NEGATIVE

## 2015-07-07 LAB — CBC
HCT: 30.5 % — ABNORMAL LOW (ref 36.0–46.0)
Hemoglobin: 10.2 g/dL — ABNORMAL LOW (ref 12.0–15.0)
MCH: 30.2 pg (ref 26.0–34.0)
MCHC: 33.4 g/dL (ref 30.0–36.0)
MCV: 90.2 fL (ref 78.0–100.0)
Platelets: 174 10*3/uL (ref 150–400)
RBC: 3.38 MIL/uL — ABNORMAL LOW (ref 3.87–5.11)
RDW: 13.9 % (ref 11.5–15.5)
WBC: 12 10*3/uL — ABNORMAL HIGH (ref 4.0–10.5)

## 2015-07-07 LAB — RPR: RPR Ser Ql: NONREACTIVE

## 2015-07-07 MED ORDER — OXYCODONE-ACETAMINOPHEN 5-325 MG PO TABS: 1.0000 | ORAL_TABLET | ORAL | Status: DC | PRN

## 2015-07-07 MED ORDER — OXYTOCIN 40 UNITS IN LACTATED RINGERS INFUSION - SIMPLE MED: 62.5000 mL/h | INTRAVENOUS | Status: DC | PRN

## 2015-07-07 MED ORDER — PROMETHAZINE HCL 25 MG/ML IJ SOLN
25.0000 mg | Freq: Four times a day (QID) | INTRAMUSCULAR | Status: DC | PRN
  Administered 2015-07-07: 25 mg via INTRAMUSCULAR
  Filled 2015-07-07: qty 1

## 2015-07-07 MED ORDER — ACETAMINOPHEN 325 MG PO TABS
650.0000 mg | ORAL_TABLET | ORAL | Status: DC | PRN
  Administered 2015-07-08: 650 mg via ORAL
  Filled 2015-07-07: qty 2

## 2015-07-07 MED ORDER — FENTANYL 2.5 MCG/ML BUPIVACAINE 1/10 % EPIDURAL INFUSION (WH - ANES): 14.0000 mL/h | INTRAMUSCULAR | Status: DC | PRN

## 2015-07-07 MED ORDER — DIPHENHYDRAMINE HCL 25 MG PO CAPS: 25.0000 mg | ORAL_CAPSULE | Freq: Four times a day (QID) | ORAL | Status: DC | PRN

## 2015-07-07 MED ORDER — ACETAMINOPHEN 325 MG PO TABS: 650.0000 mg | ORAL_TABLET | ORAL | Status: DC | PRN

## 2015-07-07 MED ORDER — PHENYLEPHRINE 40 MCG/ML (10ML) SYRINGE FOR IV PUSH (FOR BLOOD PRESSURE SUPPORT)
80.0000 ug | PREFILLED_SYRINGE | INTRAVENOUS | Status: DC | PRN
  Filled 2015-07-07: qty 20
  Filled 2015-07-07: qty 2
  Filled 2015-07-07: qty 20

## 2015-07-07 MED ORDER — NALBUPHINE HCL 10 MG/ML IJ SOLN
10.0000 mg | Freq: Four times a day (QID) | INTRAMUSCULAR | Status: DC | PRN
  Administered 2015-07-07: 10 mg via INTRAMUSCULAR
  Filled 2015-07-07: qty 1

## 2015-07-07 MED ORDER — TERBUTALINE SULFATE 1 MG/ML IJ SOLN
0.2500 mg | Freq: Once | INTRAMUSCULAR | Status: DC | PRN
  Filled 2015-07-07: qty 1

## 2015-07-07 MED ORDER — TETANUS-DIPHTH-ACELL PERTUSSIS 5-2.5-18.5 LF-MCG/0.5 IM SUSP
0.5000 mL | Freq: Once | INTRAMUSCULAR | Status: AC
  Administered 2015-07-09: 0.5 mL via INTRAMUSCULAR

## 2015-07-07 MED ORDER — DIPHENHYDRAMINE HCL 50 MG/ML IJ SOLN: 12.5000 mg | INTRAMUSCULAR | Status: DC | PRN

## 2015-07-07 MED ORDER — EPHEDRINE 5 MG/ML INJ
10.0000 mg | INTRAVENOUS | Status: DC | PRN
  Filled 2015-07-07: qty 2

## 2015-07-07 MED ORDER — ONDANSETRON HCL 4 MG PO TABS: 4.0000 mg | ORAL_TABLET | ORAL | Status: DC | PRN

## 2015-07-07 MED ORDER — OXYTOCIN 40 UNITS IN LACTATED RINGERS INFUSION - SIMPLE MED
62.5000 mL/h | INTRAVENOUS | Status: DC
  Filled 2015-07-07: qty 1000

## 2015-07-07 MED ORDER — IBUPROFEN 600 MG PO TABS
600.0000 mg | ORAL_TABLET | Freq: Four times a day (QID) | ORAL | Status: DC
  Administered 2015-07-08 – 2015-07-09 (×5): 600 mg via ORAL
  Filled 2015-07-07 (×6): qty 1

## 2015-07-07 MED ORDER — ZOLPIDEM TARTRATE 5 MG PO TABS: 5.0000 mg | ORAL_TABLET | Freq: Every evening | ORAL | Status: DC | PRN

## 2015-07-07 MED ORDER — LACTATED RINGERS IV SOLN
500.0000 mL | INTRAVENOUS | Status: DC | PRN
  Administered 2015-07-07: 500 mL via INTRAVENOUS

## 2015-07-07 MED ORDER — LIDOCAINE HCL (PF) 1 % IJ SOLN
INTRAMUSCULAR | Status: DC | PRN
  Administered 2015-07-07: 6 mL
  Administered 2015-07-07: 4 mL

## 2015-07-07 MED ORDER — OXYCODONE-ACETAMINOPHEN 5-325 MG PO TABS: 2.0000 | ORAL_TABLET | ORAL | Status: DC | PRN

## 2015-07-07 MED ORDER — TRAMADOL HCL 50 MG PO TABS
50.0000 mg | ORAL_TABLET | Freq: Four times a day (QID) | ORAL | Status: DC | PRN
  Administered 2015-07-08 – 2015-07-09 (×2): 50 mg via ORAL
  Filled 2015-07-07 (×2): qty 1

## 2015-07-07 MED ORDER — BUTORPHANOL TARTRATE 1 MG/ML IJ SOLN: 1.0000 mg | INTRAMUSCULAR | Status: DC | PRN

## 2015-07-07 MED ORDER — LIDOCAINE HCL (PF) 1 % IJ SOLN
30.0000 mL | INTRAMUSCULAR | Status: DC | PRN
  Filled 2015-07-07: qty 30

## 2015-07-07 MED ORDER — SIMETHICONE 80 MG PO CHEW: 80.0000 mg | CHEWABLE_TABLET | ORAL | Status: DC | PRN

## 2015-07-07 MED ORDER — FLEET ENEMA 7-19 GM/118ML RE ENEM: 1.0000 | ENEMA | RECTAL | Status: DC | PRN

## 2015-07-07 MED ORDER — FENTANYL 2.5 MCG/ML BUPIVACAINE 1/10 % EPIDURAL INFUSION (WH - ANES)
14.0000 mL/h | INTRAMUSCULAR | Status: DC | PRN
Start: 2015-07-07 — End: 2015-07-07
  Administered 2015-07-07: 14 mL/h via EPIDURAL
  Filled 2015-07-07 (×2): qty 125

## 2015-07-07 MED ORDER — OXYTOCIN BOLUS FROM INFUSION
500.0000 mL | INTRAVENOUS | Status: DC
  Administered 2015-07-07: 500 mL via INTRAVENOUS

## 2015-07-07 MED ORDER — CITRIC ACID-SODIUM CITRATE 334-500 MG/5ML PO SOLN
30.0000 mL | ORAL | Status: DC | PRN
  Administered 2015-07-07: 30 mL via ORAL
  Filled 2015-07-07: qty 15

## 2015-07-07 MED ORDER — SENNOSIDES-DOCUSATE SODIUM 8.6-50 MG PO TABS
2.0000 | ORAL_TABLET | ORAL | Status: DC
  Administered 2015-07-08 – 2015-07-09 (×2): 2 via ORAL
  Filled 2015-07-07 (×3): qty 2

## 2015-07-07 MED ORDER — LANOLIN HYDROUS EX OINT: TOPICAL_OINTMENT | CUTANEOUS | Status: DC | PRN

## 2015-07-07 MED ORDER — BENZOCAINE-MENTHOL 20-0.5 % EX AERO: 1.0000 "application " | INHALATION_SPRAY | CUTANEOUS | Status: DC | PRN

## 2015-07-07 MED ORDER — ONDANSETRON HCL 4 MG/2ML IJ SOLN: 4.0000 mg | Freq: Four times a day (QID) | INTRAMUSCULAR | Status: DC | PRN

## 2015-07-07 MED ORDER — PENICILLIN G POTASSIUM 5000000 UNITS IJ SOLR
2.5000 10*6.[IU] | INTRAVENOUS | Status: DC
  Administered 2015-07-07 (×3): 2.5 10*6.[IU] via INTRAVENOUS
  Filled 2015-07-07 (×6): qty 2.5

## 2015-07-07 MED ORDER — LACTATED RINGERS IV SOLN
INTRAVENOUS | Status: DC
  Administered 2015-07-07 (×3): via INTRAVENOUS

## 2015-07-07 MED ORDER — PENICILLIN G POTASSIUM 5000000 UNITS IJ SOLR
5.0000 10*6.[IU] | Freq: Once | INTRAVENOUS | Status: AC
  Administered 2015-07-07: 5 10*6.[IU] via INTRAVENOUS
  Filled 2015-07-07 (×2): qty 5

## 2015-07-07 MED ORDER — NALBUPHINE HCL 10 MG/ML IJ SOLN
10.0000 mg | INTRAMUSCULAR | Status: DC | PRN
  Administered 2015-07-07: 10 mg via INTRAVENOUS
  Filled 2015-07-07: qty 1

## 2015-07-07 MED ORDER — OXYTOCIN 40 UNITS IN LACTATED RINGERS INFUSION - SIMPLE MED
1.0000 m[IU]/min | INTRAVENOUS | Status: DC
  Administered 2015-07-07: 2 m[IU]/min via INTRAVENOUS

## 2015-07-07 MED ORDER — WITCH HAZEL-GLYCERIN EX PADS: 1.0000 "application " | MEDICATED_PAD | CUTANEOUS | Status: DC | PRN

## 2015-07-07 MED ORDER — DIBUCAINE 1 % RE OINT: 1.0000 "application " | TOPICAL_OINTMENT | RECTAL | Status: DC | PRN

## 2015-07-07 MED ORDER — ONDANSETRON HCL 4 MG/2ML IJ SOLN: 4.0000 mg | INTRAMUSCULAR | Status: DC | PRN

## 2015-07-07 MED ORDER — PRENATAL MULTIVITAMIN CH
1.0000 | ORAL_TABLET | Freq: Every day | ORAL | Status: DC
  Administered 2015-07-09: 1 via ORAL
  Filled 2015-07-07: qty 1

## 2015-07-07 NOTE — Progress Notes (Signed)
Pt to walk for one hour, then reexamine

## 2015-07-07 NOTE — Progress Notes (Signed)
Anders SimmondsSade I Weintraub is a 29 y.o. [id number] at 809w1d by LMP admitted for active labor  Subjective:   Objective: BP 109/62 mmHg  Pulse 76  Temp(Src) 98.1 F (36.7 C) (Oral)  Resp 20  Ht 4\' 11"  (1.499 m)  Wt 139 lb (63.05 kg)  BMI 28.06 kg/m2  SpO2 100%  LMP 10/06/2014      FHT:  FHR: 140 bpm, variability: moderate,  accelerations:  Present,  decelerations:  Present variable UC:   regular, every 2-4 minutes SVE:   Dilation: 7.5 Effacement (%): 100 Station: -1 Exam by:: [employer]Foley,rn  Labs: Lab Results  Component Value Date   WBC 12.0* 07/07/2015   HGB 10.2* 07/07/2015   HCT 30.5* 07/07/2015   MCV 90.2 07/07/2015   PLT 174 07/07/2015    Assessment / Plan: Augmentation of labor, progressing well  Labor: Progressing normally Preeclampsia:  n/a Fetal Wellbeing:  Category I Pain Control:  Epidural I/D:  n/a Anticipated MOD:  NSVD  Treyvonne Tata A 07/07/2015, 6:00 PM

## 2015-07-07 NOTE — Progress Notes (Signed)
Leah Walls is a 29 y.o. [id number] at 6380w1d by LMP admitted for UC's, R/O labor.  Previous C/S followed by 3 uncomplicated VBACS.  Subjective:   Objective: BP 104/57 mmHg  Pulse 81  Temp(Src) 98.1 F (36.7 C) (Oral)  Resp 18  Ht 4\' 11"  (1.499 m)  Wt 139 lb (63.05 kg)  BMI 28.06 kg/m2  LMP 10/06/2014      FHT:  FHR: 140 bpm, variability: moderate,  accelerations:  Present,  decelerations:  Absent UC:   irregular, every 2-9 minutes SVE:   Dilation: 4 Effacement (%): 70 Station: -2 Exam by:: [employer]Erin Foley,rn  Labs: Lab Results  Component Value Date   WBC 12.0* 07/07/2015   HGB 10.2* 07/07/2015   HCT 30.5* 07/07/2015   MCV 90.2 07/07/2015   PLT 174 07/07/2015    Assessment / Plan: 39 weeks. UC's.  Expectant management.  Labor: Latent Preeclampsia:  n/a Fetal Wellbeing:  Category I Pain Control:  Nubain I/D:  n/a Anticipated MOD:  NSVD  Leah Walls A 07/07/2015, 9:51 AM

## 2015-07-07 NOTE — Progress Notes (Signed)
Called to evaluate patient for prolonged numbness of legs after delivery at 18:40.  It is now 21:00 and although she still has some numbness, movent is beginning to return.  VSS.  Epidural placement by record was un- eventful.  Will visit again if feeling does not continue to improve.

## 2015-07-07 NOTE — Anesthesia Procedure Notes (Signed)

## 2015-07-07 NOTE — Plan of Care (Signed)
Problem: Consults Goal: Birthing Suites Patient Information Press F2 to bring up selections list  Outcome: Completed/Met Date Met:  07/07/15  Pt 37-[redacted] weeks EGA, VBAC, GBS+

## 2015-07-07 NOTE — Progress Notes (Signed)
Leah Walls is a 29 y.o. [id number] at 9952w1d by LMP admitted for active labor  Subjective:   Objective: BP 105/52 mmHg  Pulse 75  Temp(Src) 98.1 F (36.7 C) (Oral)  Resp 18  Ht 4\' 11"  (1.499 m)  Wt 139 lb (63.05 kg)  BMI 28.06 kg/m2  SpO2 100%  LMP 10/06/2014      FHT:  FHR: 140 bpm, variability: moderate,  accelerations:  Present,  decelerations:  Absent UC:   regular, every 2-7 minutes SVE:   Dilation: 5 Effacement (%): 80 Station: -2 Exam by:: [employer]oley,rn  Labs: Lab Results  Component Value Date   WBC 12.0* 07/07/2015   HGB 10.2* 07/07/2015   HCT 30.5* 07/07/2015   MCV 90.2 07/07/2015   PLT 174 07/07/2015    Assessment / Plan: Augmentation of labor, progressing well  Labor: Progressing normally Preeclampsia:  n/a Fetal Wellbeing:  Category I Pain Control:  Epidural I/D:  n/a Anticipated MOD:  NSVD  Leah Walls A 07/07/2015, 4:25 PM

## 2015-07-07 NOTE — H&P (Signed)
Leah Walls is a 29 y.o. female presenting for UC's. Maternal Medical History:  Reason for admission: Contractions.   Fetal activity: Perceived fetal activity is normal.   Last perceived fetal movement was within the past hour.    Prenatal complications: no prenatal complications Prenatal Complications - Diabetes: none.    OB History    Gravida Para Term Preterm AB TAB SAB Ectopic Multiple Living   5 4 3 1      4      Past Medical History  Diagnosis Date  . Headache    Past Surgical History  Procedure Laterality Date  . Cesarean section    . Forearm fracture surgery Right 2008  . Wisdom tooth extraction     Family History: family history includes Hypertension in her maternal grandmother, mother, and paternal grandmother. Social History:  reports that she has quit smoking. Her smoking use included Cigarettes. She has never used smokeless tobacco. She reports that she does not drink alcohol or use illicit drugs.   Prenatal Transfer Tool  Maternal Diabetes: No Genetic Screening: Normal Maternal Ultrasounds/Referrals: Normal Fetal Ultrasounds or other Referrals:  None Maternal Substance Abuse:  No Significant Maternal Medications:  None Significant Maternal Lab Results:  Lab values include: Group B Strep positive Other Comments:  VBAC  Review of Systems  All other systems reviewed and are negative.   Dilation: 4 Effacement (%): 90 Station: -2 Exam by:: B Mosca Blood pressure 110/68, pulse 105, temperature 98.4 F (36.9 C), temperature source Oral, resp. rate 20, height 4\' 11"  (1.499 m), weight 139 lb (63.05 kg), last menstrual period 10/06/2014. Maternal Exam:  Abdomen: Patient reports no abdominal tenderness. Fetal presentation: vertex  Cervix: Cervix evaluated by digital exam.     Physical Exam  Nursing note and vitals reviewed. Constitutional: She is oriented to person, place, and time. She appears well-developed and well-nourished.  HENT:  Head:  Normocephalic and atraumatic.  Eyes: Conjunctivae are normal. Pupils are equal, round, and reactive to light.  Neck: Normal range of motion. Neck supple.  Cardiovascular: Normal rate and regular rhythm.   Respiratory: Effort normal and breath sounds normal.  GI: Soft.  Genitourinary: Vagina normal and uterus normal.  Musculoskeletal: Normal range of motion.  Neurological: She is alert and oriented to person, place, and time.  Skin: Skin is warm and dry.  Psychiatric: She has a normal mood and affect. Her behavior is normal. Judgment and thought content normal.    Prenatal labs: ABO, Rh: O/Positive/-- (12/23 0000) Antibody: Negative (12/23 0000) Rubella: Immune (12/23 0000) RPR: Nonreactive (12/23 0000)  HBsAg: Negative (12/23 0000)  HIV: Non-reactive (12/23 0000)  GBS: Positive (06/27 0000)   Assessment/Plan: 39 weeks.  VBAC candidate.  Admit.   Dhillon Comunale A 07/07/2015, 5:18 AM

## 2015-07-07 NOTE — Anesthesia Preprocedure Evaluation (Addendum)
Anesthesia Evaluation  Patient identified by MRN, date of birth, ID band Patient awake    Reviewed: Allergy & Precautions, H&P , Patient's Chart, lab work & pertinent test results  Airway Mallampati: IV  TM Distance: >3 FB Neck ROM: full    Dental  (+) Teeth Intact   Pulmonary former smoker,  breath sounds clear to auscultation        Cardiovascular Rhythm:regular Rate:Normal     Neuro/Psych    GI/Hepatic   Endo/Other    Renal/GU      Musculoskeletal   Abdominal   Peds  Hematology  (+) anemia ,   Anesthesia Other Findings MP IV airway; small mouth, large tounge TOLAC      Reproductive/Obstetrics (+) Pregnancy                          Anesthesia Physical Anesthesia Plan  ASA: II  Anesthesia Plan: Epidural   Post-op Pain Management:    Induction:   Airway Management Planned:   Additional Equipment:   Intra-op Plan:   Post-operative Plan:   Informed Consent: I have reviewed the patients History and Physical, chart, labs and discussed the procedure including the risks, benefits and alternatives for the proposed anesthesia with the patient or authorized representative who has indicated his/her understanding and acceptance.   Dental Advisory Given  Plan Discussed with:   Anesthesia Plan Comments: (Labs checked- platelets confirmed with RN in room. Fetal heart tracing, per RN, reported to be stable enough for sitting procedure. Discussed epidural, and patient consents to the procedure:  included risk of possible headache,backache, failed block, allergic reaction, and nerve injury. This patient was asked if she had any questions or concerns before the procedure started.)        Anesthesia Quick Evaluation

## 2015-07-07 NOTE — Progress Notes (Signed)
Improving- pt can wiggle right leg- but not lift off bed. Can lift hips off the bed- and left leg

## 2015-07-07 NOTE — MAU Note (Signed)
Pt reports UC's q 3-4 minutes

## 2015-07-08 ENCOUNTER — Encounter (HOSPITAL_COMMUNITY): Payer: Self-pay | Admitting: *Deleted

## 2015-07-08 LAB — CBC
HCT: 28.9 % — ABNORMAL LOW (ref 36.0–46.0)
Hemoglobin: 9.7 g/dL — ABNORMAL LOW (ref 12.0–15.0)
MCH: 30.2 pg (ref 26.0–34.0)
MCHC: 33.6 g/dL (ref 30.0–36.0)
MCV: 90 fL (ref 78.0–100.0)
Platelets: 147 10*3/uL — ABNORMAL LOW (ref 150–400)
RBC: 3.21 MIL/uL — ABNORMAL LOW (ref 3.87–5.11)
RDW: 13.8 % (ref 11.5–15.5)
WBC: 11.7 10*3/uL — ABNORMAL HIGH (ref 4.0–10.5)

## 2015-07-08 LAB — CCBB MATERNAL DONOR DRAW

## 2015-07-08 NOTE — Anesthesia Postprocedure Evaluation (Signed)
  Anesthesia Post-op Note  Patient: Leah Walls  Procedure(s) Performed: * No procedures listed *  Patient Location: Mother/Baby  Anesthesia Type:Epidural  Level of Consciousness: awake, alert , oriented and patient cooperative  Airway and Oxygen Therapy: Patient Spontanous Breathing  Post-op Pain: none  Post-op Assessment: Patient's Cardiovascular Status Stable, Respiratory Function Stable, Patent Airway, No signs of Nausea or vomiting, Adequate PO intake, Pain level controlled, No headache, No backache, Spinal receding and Patient able to bend at knees              Post-op Vital Signs: Reviewed and stable  Last Vitals:  Filed Vitals:   07/08/15 0405  BP: 112/74  Pulse: 70  Temp: 37.1 C  Resp:     Complications: No apparent anesthesia complications

## 2015-07-08 NOTE — Lactation Note (Signed)
This note was copied from the chart of Leah Walls. Lactation Consultation Note  Patient Name: Leah Chezney Huether [id number] Date: 07/08/2015 Reason for consult: Follow-up assessment Baby 24 hours old. Mom reports that she does not want to put baby to breast, but she is pumping and bottle-feeding EBM. Mom is able to pump over 82ms of EBM at a time. Discussed pumping schedule, and hand expression. Mom states that her personal Ameda pump is not as comfortable as the hospital grade Medela. Discussed using piston with Medela pump kit or hand pump until her breasts become more comfortable with Ameda. Enc lots of STS and hand express before and after pumping. [facility], aware of OP/BFSG and LLyonphone line assistance after D/C.  Maternal Data Has patient been taught Hand Expression?: Yes (per mom.)  Feeding Feeding Type: Bottle Fed - Breast Milk Nipple Type: Slow - flow  LATCH Score/Interventions                      Lactation Tools Discussed/Used Pump Review: Setup, frequency, and cleaning;Milk Storage Initiated by:: Bedside RN. Date initiated:: 07/09/15   Consult Status Consult Status: Follow-up Date: 07/09/15 Follow-up type: In-patient    WInocente Salles7/21/2016, 1:56 PM

## 2015-07-08 NOTE — Progress Notes (Signed)
Post Partum Day 1 Subjective: no complaints  Objective: Blood pressure 112/74, pulse 70, temperature 98.7 F (37.1 C), temperature source Oral, resp. rate 18, height  (1.499 m), weight 139 lb (63.05 kg), last menstrual period 10/06/2014, SpO2 99 %, unknown if currently breastfeeding.  Physical Exam:  General: alert and no distress Lochia: appropriate Uterine Fundus: firm Incision: none DVT Evaluation: No evidence of DVT seen on physical exam.   Recent Labs  07/07/15 0335 07/08/15 0547  HGB 10.2* 9.7*  HCT 30.5* 28.9*    Assessment/Plan: Plan for discharge tomorrow   LOS: 1 day   Torria Fromer A 07/08/2015, 8:21 AM

## 2015-07-09 MED ORDER — IBUPROFEN 600 MG PO TABS: 600.0000 mg | ORAL_TABLET | Freq: Four times a day (QID) | ORAL | Status: DC | PRN

## 2015-07-09 MED ORDER — FUSION PLUS PO CAPS: 1.0000 | ORAL_CAPSULE | Freq: Every day | ORAL | Status: DC

## 2015-07-09 MED ORDER — TRAMADOL HCL 50 MG PO TABS: 50.0000 mg | ORAL_TABLET | Freq: Four times a day (QID) | ORAL | Status: DC | PRN

## 2015-07-09 NOTE — Progress Notes (Signed)
Post Partum Day 2 Subjective: no complaints  Objective: Blood pressure 108/67, pulse 54, temperature 98.4 F (36.9 C), temperature source Oral, resp. rate 18, height  (1.499 m), weight 139 lb (63.05 kg), last menstrual period 10/06/2014, SpO2 99 %, unknown if currently breastfeeding.  Physical Exam:  General: alert and no distress Lochia: appropriate Uterine Fundus: firm Incision: none DVT Evaluation: No evidence of DVT seen on physical exam.   Recent Labs  07/07/15 0335 07/08/15 0547  HGB 10.2* 9.7*  HCT 30.5* 28.9*    Assessment/Plan: Discharge home   LOS: 2 days   Addilee Neu A 07/09/2015, 6:33 AM

## 2015-07-09 NOTE — Lactation Note (Signed)
This note was copied from the chart of Leah Gracelynne Benedict. Lactation Consultation Note  Mom is planning to pump and bottle feed.  Explained [facility] loaner program to parents.  Helped mom with expressing using the symphony.  Flange size increased to a #30.  She reported increased comfort.  Dad brought the evenflo pump back into the room.  It has silicone inserts in the flanges.  Inserts removed as manufacturer's instructions stated that is how to increase the flange size.  Mom reported increased comfort using the evenflo pump this way.  Reviewed supply and demand and advised parents that if mom's milk did not come to volume by day 4 she should acquire a [facility] loaner. Understanding verbalized.  Patient Name: Leah Walls [id number] Date: 07/09/2015     Maternal Data    Feeding    LATCH Score/Interventions                      Lactation Tools Discussed/Used     Consult Status      Leah Walls 07/09/2015, 12:11 PM
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
## 2015-07-09 NOTE — Discharge Summary (Signed)
Obstetric Discharge Summary Reason for Admission: onset of labor Prenatal Procedures: ultrasound Intrapartum Procedures: spontaneous vaginal delivery Postpartum Procedures: none Complications-Operative and Postpartum: none HEMOGLOBIN  Date Value Ref Range Status  07/08/2015 9.7* 12.0 - 15.0 g/dL Final   HCT  Date Value Ref Range Status  07/08/2015 28.9* 36.0 - 46.0 % Final    Physical Exam:  General: alert and no distress Lochia: appropriate Uterine Fundus: firm Incision: none DVT Evaluation: No evidence of DVT seen on physical exam.  Discharge Diagnoses: Term Pregnancy-delivered  Discharge Information: Date: 07/09/2015 Activity: pelvic rest Diet: routine Medications: PNV, Ibuprofen, Colace, Iron and Tramadol Condition: stable Instructions: refer to practice specific booklet Discharge to: home Follow-up Information    Follow up with MARSHALL,BERNARD A, MD In 6 weeks.   Specialty:  Obstetrics and Gynecology   Contact information:   [address] [place] [address] [phone]       Newborn Data: Live born female  Birth Weight: 6 lb 14.1 oz (3120 g) APGAR: 9, 9  Home with mother.  Leah Walls A 07/09/2015, 6:37 AM

## 2015-07-13 ENCOUNTER — Encounter (HOSPITAL_COMMUNITY): Payer: Self-pay | Admitting: *Deleted

## 2015-07-13 ENCOUNTER — Inpatient Hospital Stay (EMERGENCY_DEPARTMENT_HOSPITAL)
Admission: AD | Admit: 2015-07-13 | Discharge: 2015-07-14 | Disposition: A | Discharge status: Home / Self Care | Payer: Medicaid Other | Source: Ambulatory Visit | Attending: Obstetrics | Admitting: Obstetrics

## 2015-07-13 DIAGNOSIS — N939 Abnormal uterine and vaginal bleeding, unspecified: Secondary | ICD-10-CM

## 2015-07-13 MED ORDER — LACTATED RINGERS IV BOLUS (SEPSIS)
1000.0000 mL | Freq: Once | INTRAVENOUS | Status: AC
  Administered 2015-07-14: 1000 mL via INTRAVENOUS

## 2015-07-13 NOTE — MAU Provider Note (Signed)
History     CSN: [id number]  Arrival date and time: 07/13/15 2338   First Provider Initiated Contact with Patient 07/13/15 2349      No chief complaint on file.  HPI  Leah Walls is a 29 y.o. [id number] on PPD #7 who presents to MAU today with complaint of heavy vaginal bleeding since earlier tonight. The patient states NSVD on 07/07/15 without complications. She states that bleeding had been minimal until tonight. She states that she was soaking multiple pads hourly prior to arrival with small clots. She denies pain, weakness, dizziness today. She is currently breastfeeding.   OB History    Gravida Para Term Preterm AB TAB SAB Ectopic Multiple Living   0 5      Past Medical History  Diagnosis Date  . Headache     Past Surgical History  Procedure Laterality Date  . Cesarean section    . Forearm fracture surgery Right 2008  . Wisdom tooth extraction      Family History  Problem Relation Age of Onset  . Hypertension Mother   . Hypertension Maternal Grandmother   . Hypertension Paternal Grandmother     History  Substance Use Topics  . Smoking status: Former Smoker    Types: Cigarettes  . Smokeless tobacco: Never Used  . Alcohol Use: No    Allergies:  Allergies  Allergen Reactions  . Percocet [Oxycodone-Acetaminophen] Nausea And Vomiting    Can tolerate acetaminophen    Prescriptions prior to admission  Medication Sig Dispense Refill Last Dose  . ibuprofen (ADVIL,MOTRIN) 600 MG tablet Take 1 tablet (600 mg total) by mouth every 6 (six) hours as needed for mild pain. 30 tablet 5 Past Week at Unknown time  . Prenatal Vit-Fe Fumarate-FA (PRENATAL VITAMINS PLUS) 27-1 MG TABS Take 1 tablet by mouth daily. Generic is OK - Prenatal vitamin with 1 mg Folic acid 30 tablet 1 Past Week at Unknown time  . Iron-FA-B Cmp-C-Biot-Probiotic (FUSION PLUS) CAPS Take 1 capsule by mouth daily before breakfast. 30 capsule 5 More than a month at Unknown time  .  Omeprazole Magnesium (PRILOSEC OTC PO) Take 1 tablet by mouth daily.   07/06/2015 at Unknown time  . traMADol (ULTRAM) 50 MG tablet Take 1-2 tablets (50-100 mg total) by mouth every 6 (six) hours as needed for moderate pain or severe pain. 40 tablet 2 More than a month at Unknown time    Review of Systems  Constitutional: Negative for fever and malaise/fatigue.  Gastrointestinal: Negative for abdominal pain.  Genitourinary:       + vaginal bleeding  Neurological: Negative for dizziness and weakness.   Physical Exam   Blood pressure 119/68, pulse 82, temperature 98.1 F (36.7 C), temperature source Oral, resp. rate 20, last menstrual period 10/06/2014, unknown if currently breastfeeding.  Physical Exam  Nursing note and vitals reviewed. Constitutional: She is oriented to person, place, and time. She appears well-developed and well-nourished. No distress.  HENT:  Head: Normocephalic and atraumatic.  Cardiovascular: Normal rate.   Respiratory: Effort normal.  GI: Soft. She exhibits no distension and no mass. There is no tenderness. There is no rebound and no guarding.  Neurological: She is alert and oriented to person, place, and time.  Skin: Skin is warm and dry. No erythema.  Psychiatric: She has a normal mood and affect.   Results for orders placed or performed during the hospital encounter of 07/13/15 (from the past 24  hour(s))  CBC with Differential/Platelet     Status: Abnormal   Collection Time: 07/14/15 12:05 AM  Result Value Ref Range   WBC 6.7 4.0 - 10.5 K/uL   RBC 3.44 (L) 3.87 - 5.11 MIL/uL   Hemoglobin 10.1 (L) 12.0 - 15.0 g/dL   HCT 16.1 (L) 09.6 - 04.5 %   MCV 91.3 78.0 - 100.0 fL   MCH 29.4 26.0 - 34.0 pg   MCHC 32.2 30.0 - 36.0 g/dL   RDW 40.9 81.1 - 91.4 %   Platelets 270 150 - 400 K/uL   Neutrophils Relative % 48 43 - 77 %   Neutro Abs 3.2 1.7 - 7.7 K/uL   Lymphocytes Relative 42 12 - 46 %   Lymphs Abs 2.8 0.7 - 4.0 K/uL   Monocytes Relative 7 3 - 12 %    Monocytes Absolute 0.5 0.1 - 1.0 K/uL   Eosinophils Relative 3 0 - 5 %   Eosinophils Absolute 0.2 0.0 - 0.7 K/uL   Basophils Relative 0 0 - 1 %   Basophils Absolute 0.0 0.0 - 0.1 K/uL  CBC     Status: Abnormal   Collection Time: 07/14/15  2:07 AM  Result Value Ref Range   WBC 6.5 4.0 - 10.5 K/uL   RBC 3.29 (L) 3.87 - 5.11 MIL/uL   Hemoglobin 9.6 (L) 12.0 - 15.0 g/dL   HCT 78.2 (L) 95.6 - 21.3 %   MCV 90.9 78.0 - 100.0 fL   MCH 29.2 26.0 - 34.0 pg   MCHC 32.1 30.0 - 36.0 g/dL   RDW 08.6 57.8 - 46.9 %   Platelets 227 150 - 400 K/uL   US Pelvis Complete  07/14/2015   CLINICAL DATA:  Heavy vaginal bleeding, several hr duration. Five days postpartum.  EXAM: TRANSABDOMINAL ULTRASOUND OF PELVIS  TECHNIQUE: Transabdominal ultrasound examination of the pelvis was performed including evaluation of the uterus, ovaries, adnexal regions, and pelvic cul-de-sac.  COMPARISON:  None.  FINDINGS: Uterus  Measurements: 13.9 x 8.0 x 10.6 cm. No myometrial masses.  Endometrium  Thickness: 2.9 cm. Irregularly thickened endometrium with vascularity on Doppler.  Right ovary  Measurements: 1.6 x 1.8 x 3.0 cm. Normal appearance/no adnexal mass.  Left ovary  Measurements: 1.7 x 2.3 x 3.5 cm. Normal appearance/no adnexal mass.  Other findings:  No free fluid  IMPRESSION: Irregular thickening and vascularity of the endometrium. Cannot exclude retained products of conception.   Electronically Signed   By: Ellery Plunk M.D.   On: 07/14/2015 00:33    MAU Course  Procedures None  MDM Delivery notes reviewed CBC today IV LR bolus given Bedside US ordered to assess for retained POC 0040 - Discussed patient presentation, VS, lab and [place] results with Dr. Gaynell Face, including patient history. He recommends 0.2 mg IM injection of methergine now and continued LR IV fluids. If patient responds well to methergine, she may be discharged with 0.2 mg PO tablets to be take q 4 hours x 6 doses and follow-up with Dr. Gaynell Face in 1  week.  Bleeding has improved significantly after Methergine. Patient complains of cramping and nausea.  4 mg IV Zofran and 1000 mg Tylenol given. Patient is driving herself home and unable to take anything stronger for pain.   Assessment and Plan  A: PPD # 7 PP Vaginal bleeding  P: Discharge home Rx for Tylenol #3 and Methergine given to patient Bleeding precautions discussed Patient advised to follow-up with Dr. Gaynell Face within the week. Patient should  call for an appointment to follow-up for bleeding.  Patient may return to MAU as needed or if her condition were to change or worsen   Marny Lowenstein, PA-C  07/14/2015, 2:33 AM
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
## 2015-07-14 ENCOUNTER — Inpatient Hospital Stay (HOSPITAL_COMMUNITY): Payer: Medicaid Other | Admitting: Anesthesiology

## 2015-07-14 ENCOUNTER — Ambulatory Visit (HOSPITAL_COMMUNITY)
Admission: AD | Admit: 2015-07-14 | Discharge: 2015-07-15 | Disposition: A | Discharge status: Home / Self Care | Payer: Medicaid Other | Source: Ambulatory Visit | Attending: Obstetrics | Admitting: Obstetrics

## 2015-07-14 ENCOUNTER — Encounter (HOSPITAL_COMMUNITY)
Admission: AD | Disposition: A | Discharge status: Home / Self Care | Payer: Self-pay | Source: Ambulatory Visit | Attending: Obstetrics

## 2015-07-14 ENCOUNTER — Encounter (HOSPITAL_COMMUNITY): Payer: Self-pay | Admitting: *Deleted

## 2015-07-14 ENCOUNTER — Inpatient Hospital Stay (HOSPITAL_COMMUNITY): Payer: Medicaid Other

## 2015-07-14 DIAGNOSIS — Z79899 Other long term (current) drug therapy: Secondary | ICD-10-CM | POA: Diagnosis not present

## 2015-07-14 DIAGNOSIS — Z87891 Personal history of nicotine dependence: Secondary | ICD-10-CM | POA: Diagnosis not present

## 2015-07-14 HISTORY — PX: DILATION AND EVACUATION: SHX1459

## 2015-07-14 LAB — CBC
HCT: 29.9 % — ABNORMAL LOW (ref 36.0–46.0)
HCT: 29.2 % — ABNORMAL LOW (ref 36.0–46.0)
Hemoglobin: 9.6 g/dL — ABNORMAL LOW (ref 12.0–15.0)
Hemoglobin: 9.4 g/dL — ABNORMAL LOW (ref 12.0–15.0)
MCH: 29.2 pg (ref 26.0–34.0)
MCH: 29.7 pg (ref 26.0–34.0)
MCHC: 32.1 g/dL (ref 30.0–36.0)
MCHC: 32.2 g/dL (ref 30.0–36.0)
MCV: 90.9 fL (ref 78.0–100.0)
MCV: 92.4 fL (ref 78.0–100.0)
Platelets: 227 10*3/uL (ref 150–400)
Platelets: 278 10*3/uL (ref 150–400)
RBC: 3.29 MIL/uL — ABNORMAL LOW (ref 3.87–5.11)
RBC: 3.16 MIL/uL — ABNORMAL LOW (ref 3.87–5.11)
RDW: 13.7 % (ref 11.5–15.5)
RDW: 13.9 % (ref 11.5–15.5)
WBC: 6.5 10*3/uL (ref 4.0–10.5)
WBC: 6.3 10*3/uL (ref 4.0–10.5)

## 2015-07-14 LAB — CBC WITH DIFFERENTIAL/PLATELET
Basophils Absolute: 0 10*3/uL (ref 0.0–0.1)
Basophils Relative: 0 % (ref 0–1)
Eosinophils Absolute: 0.2 10*3/uL (ref 0.0–0.7)
Eosinophils Relative: 3 % (ref 0–5)
HCT: 31.4 % — ABNORMAL LOW (ref 36.0–46.0)
Hemoglobin: 10.1 g/dL — ABNORMAL LOW (ref 12.0–15.0)
Lymphocytes Relative: 42 % (ref 12–46)
Lymphs Abs: 2.8 10*3/uL (ref 0.7–4.0)
MCH: 29.4 pg (ref 26.0–34.0)
MCHC: 32.2 g/dL (ref 30.0–36.0)
MCV: 91.3 fL (ref 78.0–100.0)
Monocytes Absolute: 0.5 10*3/uL (ref 0.1–1.0)
Monocytes Relative: 7 % (ref 3–12)
Neutro Abs: 3.2 10*3/uL (ref 1.7–7.7)
Neutrophils Relative %: 48 % (ref 43–77)
Platelets: 270 10*3/uL (ref 150–400)
RBC: 3.44 MIL/uL — ABNORMAL LOW (ref 3.87–5.11)
RDW: 13.7 % (ref 11.5–15.5)
WBC: 6.7 10*3/uL (ref 4.0–10.5)

## 2015-07-14 LAB — COMPREHENSIVE METABOLIC PANEL
ALT: 10 U/L — ABNORMAL LOW (ref 14–54)
AST: 15 U/L (ref 15–41)
Albumin: 2.9 g/dL — ABNORMAL LOW (ref 3.5–5.0)
Alkaline Phosphatase: 105 U/L (ref 38–126)
Anion gap: 5 (ref 5–15)
BUN: 7 mg/dL (ref 6–20)
CO2: 22 mmol/L (ref 22–32)
Calcium: 8.1 mg/dL — ABNORMAL LOW (ref 8.9–10.3)
Chloride: 113 mmol/L — ABNORMAL HIGH (ref 101–111)
Creatinine, Ser: 0.92 mg/dL (ref 0.44–1.00)
GFR calc Af Amer: >60 mL/min (ref >60)
GFR calc non Af Amer: >60 mL/min (ref >60)
Glucose, Bld: 126 mg/dL — ABNORMAL HIGH (ref 65–99)
Potassium: 3.2 mmol/L — ABNORMAL LOW (ref 3.5–5.1)
Sodium: 140 mmol/L (ref 135–145)
Total Bilirubin: 0.5 mg/dL (ref 0.3–1.2)
Total Protein: 6.3 g/dL — ABNORMAL LOW (ref 6.5–8.1)

## 2015-07-14 LAB — TYPE AND SCREEN
ABO/RH(D): O POS
Antibody Screen: NEGATIVE

## 2015-07-14 SURGERY — DILATION AND EVACUATION, UTERUS
Anesthesia: General | Site: Vagina | Laterality: Bilateral

## 2015-07-14 MED ORDER — LIDOCAINE HCL (CARDIAC) 20 MG/ML IV SOLN
INTRAVENOUS | Status: DC | PRN
  Administered 2015-07-14: 40 mg via INTRAVENOUS

## 2015-07-14 MED ORDER — MIDAZOLAM HCL 5 MG/5ML IJ SOLN
INTRAMUSCULAR | Status: DC | PRN
  Administered 2015-07-14: 2 mg via INTRAVENOUS

## 2015-07-14 MED ORDER — FAMOTIDINE IN NACL 20-0.9 MG/50ML-% IV SOLN
INTRAVENOUS | Status: AC
  Filled 2015-07-14: qty 50

## 2015-07-14 MED ORDER — FENTANYL CITRATE (PF) 250 MCG/5ML IJ SOLN
INTRAMUSCULAR | Status: AC
  Filled 2015-07-14: qty 5

## 2015-07-14 MED ORDER — ONDANSETRON HCL 4 MG/2ML IJ SOLN
INTRAMUSCULAR | Status: AC
  Filled 2015-07-14: qty 2

## 2015-07-14 MED ORDER — FENTANYL CITRATE (PF) 100 MCG/2ML IJ SOLN: 25.0000 ug | INTRAMUSCULAR | Status: DC | PRN

## 2015-07-14 MED ORDER — PROPOFOL 10 MG/ML IV BOLUS
INTRAVENOUS | Status: DC | PRN
  Administered 2015-07-14: 150 mg via INTRAVENOUS

## 2015-07-14 MED ORDER — METHYLERGONOVINE MALEATE 0.2 MG PO TABS: 0.2000 mg | ORAL_TABLET | ORAL | Status: DC

## 2015-07-14 MED ORDER — ONDANSETRON HCL 4 MG/2ML IJ SOLN
INTRAMUSCULAR | Status: DC | PRN
  Administered 2015-07-14: 4 mg via INTRAVENOUS

## 2015-07-14 MED ORDER — OXYTOCIN 10 UNIT/ML IJ SOLN
INTRAMUSCULAR | Status: AC
  Filled 2015-07-14: qty 4

## 2015-07-14 MED ORDER — LACTATED RINGERS IV BOLUS (SEPSIS)
1000.0000 mL | Freq: Once | INTRAVENOUS | Status: AC
  Administered 2015-07-14: 1000 mL via INTRAVENOUS

## 2015-07-14 MED ORDER — PROPOFOL 10 MG/ML IV BOLUS
INTRAVENOUS | Status: AC
  Filled 2015-07-14: qty 20

## 2015-07-14 MED ORDER — CEFAZOLIN SODIUM-DEXTROSE 2-3 GM-% IV SOLR
INTRAVENOUS | Status: AC
  Filled 2015-07-14: qty 50

## 2015-07-14 MED ORDER — CEFAZOLIN SODIUM-DEXTROSE 2-3 GM-% IV SOLR
INTRAVENOUS | Status: DC | PRN
  Administered 2015-07-14: 2 g via INTRAVENOUS

## 2015-07-14 MED ORDER — BENZOCAINE-MENTHOL 20-0.5 % EX AERO
1.0000 "application " | INHALATION_SPRAY | Freq: Once | CUTANEOUS | Status: AC
  Administered 2015-07-14: 1 via TOPICAL
  Filled 2015-07-14: qty 56

## 2015-07-14 MED ORDER — MIDAZOLAM HCL 2 MG/2ML IJ SOLN
INTRAMUSCULAR | Status: AC
  Filled 2015-07-14: qty 2

## 2015-07-14 MED ORDER — ACETAMINOPHEN-CODEINE #3 300-30 MG PO TABS: 1.0000 | ORAL_TABLET | Freq: Four times a day (QID) | ORAL | Status: DC | PRN

## 2015-07-14 MED ORDER — ONDANSETRON HCL 4 MG/2ML IJ SOLN
4.0000 mg | Freq: Once | INTRAMUSCULAR | Status: AC
  Administered 2015-07-14: 4 mg via INTRAVENOUS
  Filled 2015-07-14: qty 2

## 2015-07-14 MED ORDER — FAMOTIDINE IN NACL 20-0.9 MG/50ML-% IV SOLN
20.0000 mg | Freq: Once | INTRAVENOUS | Status: AC
  Administered 2015-07-14: 20 mg via INTRAVENOUS

## 2015-07-14 MED ORDER — METHYLERGONOVINE MALEATE 0.2 MG/ML IJ SOLN
0.2000 mg | Freq: Once | INTRAMUSCULAR | Status: AC
  Administered 2015-07-14: 0.2 mg via INTRAMUSCULAR
  Filled 2015-07-14: qty 1

## 2015-07-14 MED ORDER — SUCCINYLCHOLINE CHLORIDE 200 MG/10ML IV SOSY
PREFILLED_SYRINGE | INTRAVENOUS | Status: DC | PRN
  Administered 2015-07-14: 100 mg via INTRAVENOUS

## 2015-07-14 MED ORDER — LACTATED RINGERS IV SOLN
INTRAVENOUS | Status: DC | PRN
  Administered 2015-07-14: 23:00:00 via INTRAVENOUS

## 2015-07-14 MED ORDER — ACETAMINOPHEN 500 MG PO TABS
1000.0000 mg | ORAL_TABLET | Freq: Once | ORAL | Status: AC
  Administered 2015-07-14: 1000 mg via ORAL
  Filled 2015-07-14: qty 2

## 2015-07-14 MED ORDER — ONDANSETRON HCL 4 MG/2ML IJ SOLN: 4.0000 mg | Freq: Once | INTRAMUSCULAR | Status: AC | PRN

## 2015-07-14 MED ORDER — LIDOCAINE HCL (CARDIAC) 20 MG/ML IV SOLN
INTRAVENOUS | Status: AC
  Filled 2015-07-14: qty 5

## 2015-07-14 MED ORDER — FENTANYL CITRATE (PF) 100 MCG/2ML IJ SOLN
INTRAMUSCULAR | Status: DC | PRN
  Administered 2015-07-14: 50 ug via INTRAVENOUS
  Administered 2015-07-14: 100 ug via INTRAVENOUS

## 2015-07-14 MED ORDER — DEXAMETHASONE SODIUM PHOSPHATE 10 MG/ML IJ SOLN
INTRAMUSCULAR | Status: AC
  Filled 2015-07-14: qty 1

## 2015-07-14 MED ORDER — ONDANSETRON 8 MG PO TBDP: 8.0000 mg | ORAL_TABLET | Freq: Once | ORAL | Status: DC

## 2015-07-14 MED ORDER — DEXAMETHASONE SODIUM PHOSPHATE 10 MG/ML IJ SOLN
INTRAMUSCULAR | Status: DC | PRN
  Administered 2015-07-14: 10 mg via INTRAVENOUS

## 2015-07-14 MED ORDER — OXYTOCIN 40 UNITS IN LACTATED RINGERS INFUSION - SIMPLE MED
INTRAVENOUS | Status: DC | PRN
  Administered 2015-07-14: 40 mL via INTRAVENOUS

## 2015-07-14 SURGICAL SUPPLY — 18 items
CATH ROBINSON RED A/P 16FR (CATHETERS) ×2 IMPLANT
CLOTH BEACON ORANGE TIMEOUT ST (SAFETY) ×2 IMPLANT
DECANTER SPIKE VIAL GLASS SM (MISCELLANEOUS) ×2 IMPLANT
GLOVE BIO SURGEON STRL SZ8.5 (GLOVE) ×2 IMPLANT
GOWN STRL REUS W/TWL 2XL LVL3 (GOWN DISPOSABLE) ×2 IMPLANT
GOWN STRL REUS W/TWL LRG LVL3 (GOWN DISPOSABLE) ×2 IMPLANT
KIT BERKELEY 1ST TRIMESTER 3/8 (MISCELLANEOUS) ×2 IMPLANT
NS IRRIG 1000ML POUR BTL (IV SOLUTION) ×2 IMPLANT
PACK VAGINAL MINOR WOMEN LF (CUSTOM PROCEDURE TRAY) ×2 IMPLANT
PAD OB MATERNITY 4.3X12.25 (PERSONAL CARE ITEMS) ×2 IMPLANT
PAD PREP 24X48 CUFFED NSTRL (MISCELLANEOUS) ×2 IMPLANT
SET BERKELEY SUCTION TUBING (SUCTIONS) ×2 IMPLANT
TOWEL OR 17X24 6PK STRL BLUE (TOWEL DISPOSABLE) ×4 IMPLANT
VACURETTE 10 RIGID CVD (CANNULA) IMPLANT
VACURETTE 12 RIGID CVD (CANNULA) ×2 IMPLANT
VACURETTE 7MM CVD STRL WRAP (CANNULA) IMPLANT
VACURETTE 8 RIGID CVD (CANNULA) IMPLANT
VACURETTE 9 RIGID CVD (CANNULA) IMPLANT

## 2015-07-14 NOTE — MAU Note (Signed)
Took medicine that was given to me, started bleeding real heavy again

## 2015-07-14 NOTE — Discharge Instructions (Signed)
Discharge instructions   You can wash your hair  Shower  Eat what you want  Drink what you want  See me in 6 weeks  Your ankles are going to swell more in the next 2 weeks than when pregnant  No sex for 6 weeks   MARSHALL,BERNARD A, MD 07/14/2015  Dilation and Curettage or Vacuum Curettage Dilation and curettage (D&C) and vacuum curettage are minor procedures. A D&C involves stretching (dilation) the cervix and scraping (curettage) the inside lining of the womb (uterus). During a D&C, tissue is gently scraped from the inside lining of the uterus. During a vacuum curettage, the lining and tissue in the uterus are removed with the use of gentle suction.  Curettage may be performed to either diagnose or treat a problem. As a diagnostic procedure, curettage is performed to examine tissues from the uterus. A diagnostic curettage may be performed for the following symptoms:   Irregular bleeding in the uterus.   Bleeding with the development of clots.   Spotting between menstrual periods.   Prolonged menstrual periods.   Bleeding after menopause.   No menstrual period (amenorrhea).   A change in size and shape of the uterus.  As a treatment procedure, curettage may be performed for the following reasons:   Removal of an IUD (intrauterine device).   Removal of retained placenta after giving birth. Retained placenta can cause an infection or bleeding severe enough to require transfusions.   Abortion.   Miscarriage.   Removal of polyps inside the uterus.   Removal of uncommon types of noncancerous lumps (fibroids).  LET [facility] CARE PROVIDER KNOW ABOUT:   Any allergies you have.   All medicines you are taking, including vitamins, herbs, eye drops, creams, and over-the-counter medicines.   Previous problems you or members of your family have had with the use of anesthetics.   Any blood disorders you have.   Previous surgeries you have had.    Medical conditions you have. RISKS AND COMPLICATIONS  Generally, this is a safe procedure. However, as with any procedure, complications can occur. Possible complications include:  Excessive bleeding.   Infection of the uterus.   Damage to the cervix.   Development of scar tissue (adhesions) inside the uterus, later causing abnormal amounts of menstrual bleeding.   Complications from the general anesthetic, if a general anesthetic is used.   Putting a hole (perforation) in the uterus. This is rare.  BEFORE THE PROCEDURE   Eat and drink before the procedure only as directed by your health care provider.   Arrange for someone to take you home.  PROCEDURE  This procedure usually takes about 15-30 minutes.  You will be given one of the following:  A medicine that numbs the area in and around the cervix (local anesthetic).   A medicine to make you sleep through the procedure (general anesthetic).  You will lie on your back with your legs in stirrups.   A warm metal or plastic instrument (speculum) will be placed in your vagina to keep it open and to allow the health care provider to see the cervix.  There are two ways in which your cervix can be softened and dilated. These include:   Taking a medicine.   Having thin rods (laminaria) inserted into your cervix.   A curved tool (curette) will be used to scrape cells from the inside lining of the uterus. In some cases, gentle suction is applied with the curette. The curette will then be  removed.  AFTER THE PROCEDURE   You will rest in the recovery area until you are stable and are ready to go home.   You may feel sick to your stomach (nauseous) or throw up (vomit) if you were given a general anesthetic.   You may have a sore throat if a tube was placed in your throat during general anesthesia.   You may have light cramping and bleeding. This may last for 2 days to 2 weeks after the procedure.   Your  uterus needs to make a new lining after the procedure. This may make your next period late. Document Released: 12/04/2005 Document Revised: 08/06/2013 Document Reviewed: 07/03/2013 [facility] Patient Information 2015 [place], [place]. This information is not intended to replace advice given to you by your health care provider. Make sure you discuss any questions you have with your health care provider.

## 2015-07-14 NOTE — MAU Provider Note (Signed)
History     CSN: [id number]  Arrival date and time: 07/14/15 2118   First Provider Initiated Contact with Patient 07/14/15 2128      Chief Complaint  Patient presents with  . Vaginal Bleeding   HPI  Leah Walls is a 29 y.o. Leah Walls who had a NSVD on 07/07/15. She was seen yesterday for heavy vaginal bleeding. She was given methergine in MAU and sent home with methergine. She states that the bleeding slowed down significantly until a couple of hours ago. At that time she began to bleed heavily again. She states that she has been filling up a pad in one hour. She denies any abdominal pain at this time. She states that she has been taking the PO methergine at home as it was prescribed. She reports feeling dizzy and weak today.   Past Medical History  Diagnosis Date  . Headache     Past Surgical History  Procedure Laterality Date  . Cesarean section    . Forearm fracture surgery Right 2008  . Wisdom tooth extraction      Family History  Problem Relation Age of Onset  . Hypertension Mother   . Hypertension Maternal Grandmother   . Hypertension Paternal Grandmother     History  Substance Use Topics  . Smoking status: Former Smoker    Types: Cigarettes  . Smokeless tobacco: Never Used  . Alcohol Use: No    Allergies:  Allergies  Allergen Reactions  . Percocet [Oxycodone-Acetaminophen] Nausea And Vomiting    Can tolerate acetaminophen    Prescriptions prior to admission  Medication Sig Dispense Refill Last Dose  . acetaminophen-codeine (TYLENOL #3) 300-30 MG per tablet Take 1-2 tablets by mouth every 6 (six) hours as needed for moderate pain. 15 tablet 0   . ibuprofen (ADVIL,MOTRIN) 600 MG tablet Take 1 tablet (600 mg total) by mouth every 6 (six) hours as needed for mild pain. 30 tablet 5 Past Week at Unknown time  . Iron-FA-B Cmp-C-Biot-Probiotic (FUSION PLUS) CAPS Take 1 capsule by mouth daily before breakfast. 30 capsule 5 More than a month at Unknown time  .  methylergonovine (METHERGINE) 0.2 MG tablet Take 1 tablet (0.2 mg total) by mouth every 4 (four) hours. 6 tablet 0   . Omeprazole Magnesium (PRILOSEC OTC PO) Take 1 tablet by mouth daily.   07/06/2015 at Unknown time  . Prenatal Vit-Fe Fumarate-FA (PRENATAL VITAMINS PLUS) 27-1 MG TABS Take 1 tablet by mouth daily. Generic is OK - Prenatal vitamin with 1 mg Folic acid 30 tablet 1 Past Week at Unknown time  . traMADol (ULTRAM) 50 MG tablet Take 1-2 tablets (50-100 mg total) by mouth every 6 (six) hours as needed for moderate pain or severe pain. 40 tablet 2 More than a month at Unknown time    Review of Systems  Constitutional: Negative for fever.  Gastrointestinal: Negative for nausea, vomiting and abdominal pain.  Genitourinary: Negative for dysuria, urgency and frequency.  Neurological: Positive for dizziness and weakness.   Physical Exam   Blood pressure 119/77, pulse 86, temperature 98.1 F (36.7 C), temperature source Oral, resp. rate 18, height 4\' 11"  (1.499 m), weight 63.05 kg (139 lb), last menstrual period 10/06/2014, unknown if currently breastfeeding.  Physical Exam  Nursing note and vitals reviewed. Constitutional: She is oriented to person, place, and time. She appears well-developed and well-nourished. No distress.  HENT:  Head: Normocephalic.  Cardiovascular: Normal rate.   Respiratory: Effort normal.  GI: Soft. There is no tenderness.  There is no rebound.  Genitourinary:  About a dollar sized spot of blood on new pad.   Neurological: She is alert and oriented to person, place, and time.  Skin: Skin is warm and dry.  Psychiatric: She has a normal mood and affect.   Results for orders placed or performed during the hospital encounter of 07/13/15 (from the past 24 hour(s))  CBC with Differential/Platelet     Status: Abnormal   Collection Time: 07/14/15 12:05 AM  Result Value Ref Range   WBC 6.7 4.0 - 10.5 K/uL   RBC 3.44 (L) 3.87 - 5.11 MIL/uL   Hemoglobin 10.1 (L)  12.0 - 15.0 g/dL   HCT 09.8 (L) 11.9 - 14.7 %   MCV 91.3 78.0 - 100.0 fL   MCH 29.4 26.0 - 34.0 pg   MCHC 32.2 30.0 - 36.0 g/dL   RDW 82.9 56.2 - 13.0 %   Platelets 270 150 - 400 K/uL   Neutrophils Relative % 48 43 - 77 %   Neutro Abs 3.2 1.7 - 7.7 K/uL   Lymphocytes Relative 42 12 - 46 %   Lymphs Abs 2.8 0.7 - 4.0 K/uL   Monocytes Relative 7 3 - 12 %   Monocytes Absolute 0.5 0.1 - 1.0 K/uL   Eosinophils Relative 3 0 - 5 %   Eosinophils Absolute 0.2 0.0 - 0.7 K/uL   Basophils Relative 0 0 - 1 %   Basophils Absolute 0.0 0.0 - 0.1 K/uL  CBC     Status: Abnormal   Collection Time: 07/14/15  2:07 AM  Result Value Ref Range   WBC 6.5 4.0 - 10.5 K/uL   RBC 3.29 (L) 3.87 - 5.11 MIL/uL   Hemoglobin 9.6 (L) 12.0 - 15.0 g/dL   HCT 86.5 (L) 78.4 - 69.6 %   MCV 90.9 78.0 - 100.0 fL   MCH 29.2 26.0 - 34.0 pg   MCHC 32.1 30.0 - 36.0 g/dL   RDW 29.5 28.4 - 13.2 %   Platelets 227 150 - 400 K/uL    MAU Course  Procedures  MDM 2200: D/W Dr. Gaynell Face will prep for D&E  Assessment and Plan  Delayed PPH  to OR with Dr. Gaynell Face for D&E  [name] 07/14/2015, 9:52 PM

## 2015-07-14 NOTE — Discharge Instructions (Signed)
Postpartum Hemorrhage  Postpartum hemorrhage is excessive blood loss after childbirth. Some blood loss is normal after delivering a baby. However, postpartum hemorrhage is a potentially serious condition.   CAUSES   · A loss of muscle tone in the uterus after childbirth.  · Failure to deliver all of the placenta.  · Wounds in the birth canal caused by delivery of the fetus.  · A maternal bleeding disorder that prevents blood clotting (rare).  RISK FACTORS  You are at greater risk for postpartum hemorrhage if you:  · Have a history of postpartum hemorrhage.  · Have delivered more than one baby.  · Had preeclampsia or eclampsia.  · Had problems with the placenta.  · Had complications during your labor or delivery.  · Are obese.  · Are Asian or Hispanic.  SIGNS AND SYMPTOMS   Vaginal bleeding after delivery is normal and should be expected. Bleeding (lochia) will occur for several days after childbirth. This can be expected with normal vaginal deliveries and cesarean deliveries.   You are bleeding too much after your delivery if you are:  · Passing large clots or pieces of tissue. This may be small pieces of placenta left after delivery.    · Soaking more than one sanitary pad per hour for several hours.    · Having heavy, bright-red bleeding that occurs 4 days or more after delivery.    · Having a discharge that has a bad smell or if you begin to run an unexplained fever.    · Having times of lightheadedness or fainting, feeling short of breath, or having your heart beat fast with very little activity.    DIAGNOSIS   A diagnosis is based on your symptoms and a physical exam of your perineum, vagina, cervix, and uterus. Diagnostic tests may include:  · Blood pressure and pulse.  · Blood tests.  · Blood clotting tests.  · Ultrasonography.  TREATMENT  · Treatment is based on the severity of bleeding and may include:  ¨ Uterine massage.  ¨ Medicines.  ¨ Blood transfusions.  · Sometimes bleeding occurs if portions of the  placenta are left behind in the uterus after delivery. If this happens, often a curettage or scraping of the inside of the uterus must be done. This usually stops the bleeding. If this treatment does not stop the bleeding, surgery (hysterectomy) may have to be performed to remove the uterus.  · If bleeding is due to clotting or bleeding problems that are not related to the pregnancy, other treatments may be needed.    HOME CARE INSTRUCTIONS   · Limit your activity as directed by your health care provider. Your health care provider may order bed rest (getting up to the bathroom only) or may allow you to continue light activity.    · Keep track of the number of pads you use each day and how soaked (saturated) they are. Write this number down.    · Do not use tampons. Do not douche or have sexual intercourse until approved by your health care provider.    · Drink enough fluids to keep your urine clear or pale yellow.    · Get proper amounts of rest.    · Eat foods that are rich in iron, such as spinach, red meat, and legumes.    SEEK IMMEDIATE MEDICAL CARE IF:  · You experience severe cramps in your stomach, back, or belly (abdomen).    · You have a fever.    · You pass large clots or tissue. Save any tissue for your health care provider   to look at.    · Your bleeding increases.  · You become weak or lightheaded, or you pass out.    · Your sanitary pad count per hour is increasing.  MAKE SURE YOU:  · Understand these instructions.  · Will watch your condition.  · Will get help right away if you are not doing well or get worse.  Document Released: 02/24/2004 Document Revised: 12/09/2013 Document Reviewed: 05/22/2013  Exit[employer]® Patient Information ©2015 [employer]. This information is not intended to replace advice given to you by your health care provider. Make sure you discuss any questions you have with your health care provider.

## 2015-07-14 NOTE — Anesthesia Preprocedure Evaluation (Signed)
Anesthesia Evaluation  Patient identified by MRN, date of birth, ID band Patient awake    Reviewed: Allergy & Precautions, NPO status , Patient's Chart, lab work & pertinent test results  History of Anesthesia Complications Negative for: history of anesthetic complications  Airway Mallampati: III  TM Distance: >3 FB Neck ROM: Full  Mouth opening: Limited Mouth Opening Comment: Small oval mouth opening, plan to use glidescope Dental no notable dental hx. (+) Dental Advisory Given   Pulmonary former smoker,  breath sounds clear to auscultation  Pulmonary exam normal       Cardiovascular negative cardio ROS Normal cardiovascular examRhythm:Regular Rate:Normal     Neuro/Psych  Headaches, negative psych ROS   GI/Hepatic negative GI ROS, Neg liver ROS,   Endo/Other  negative endocrine ROS  Renal/GU negative Renal ROS  negative genitourinary   Musculoskeletal negative musculoskeletal ROS (+)   Abdominal   Peds negative pediatric ROS (+)  Hematology negative hematology ROS (+)   Anesthesia Other Findings   Reproductive/Obstetrics negative OB ROS One week postpartum                             Anesthesia Physical Anesthesia Plan  ASA: II and emergent  Anesthesia Plan: General   Post-op Pain Management:    Induction: Intravenous, Rapid sequence and Cricoid pressure planned  Airway Management Planned: Oral ETT  Additional Equipment:   Intra-op Plan:   Post-operative Plan: Extubation in OR  Informed Consent: I have reviewed the patients History and Physical, chart, labs and discussed the procedure including the risks, benefits and alternatives for the proposed anesthesia with the patient or authorized representative who has indicated his/her understanding and acceptance.   Dental advisory given  Plan Discussed with: CRNA  Anesthesia Plan Comments:         Anesthesia Quick  Evaluation

## 2015-07-14 NOTE — H&P (Signed)
This is Dr. Francoise Ceo dictating the history and physical on  Leah Walls  she's a 29 year old gravida 5 para 5005 who  Had  a normal delivery on 07/07/2015 and states that yesterday she started having some intermittently  Heavy  bleeding and came to [facility]U and had ultrasound which showed retained products and clots which is the normal ultrasound report she was given Methergine her  Bleeding  improved and tonight she says the bleeding restarted so she is back and  Will  have a D&E Past medical history negative Past past surgical history negative Social history negative System review negative Family history negative Physical exam well-developed female in no distress HEENT negative Lungs clear to P&A Heart regular rhythm no murmurs or gallops Breasts negative Lungs clear to P&A Abdomen uterus 20 week size postpartum Vaginal exam moderate amount of bleeding noted Extremities negative

## 2015-07-14 NOTE — Anesthesia Procedure Notes (Signed)
Procedure Name: Intubation Performed by: Fanny Dance L Pre-anesthesia Checklist: Patient identified, Patient being monitored, Timeout performed, Emergency Drugs available and Suction available Patient Re-evaluated:Patient Re-evaluated prior to inductionOxygen Delivery Method: Circle System Utilized Preoxygenation: Pre-oxygenation with 100% oxygen Intubation Type: IV induction, Rapid sequence and Cricoid Pressure applied Laryngoscope Size: 3 and Glidescope Grade View: Grade I Tube type: Oral Tube size: 7.0 mm Number of attempts: 1 Airway Equipment and Method: stylet Placement Confirmation: ETT inserted through vocal cords under direct vision,  positive ETCO2 and breath sounds checked- equal and bilateral Secured at: 19 cm Tube secured with: Tape Dental Injury: Teeth and Oropharynx as per pre-operative assessment  Comments: Glidescope used secondary to anticipated challenging airway due to small mouth opening and being one week postpartum. No mask ventilation attempted as RSI was performed. Easy intubation with glidescope. Grade I view. Did require lip to held to side to pass ETT as she did have a small mouth.

## 2015-07-14 NOTE — Transfer of Care (Signed)
Immediate Anesthesia Transfer of Care Note  Patient: Leah Walls  Procedure(s) Performed: Procedure(s): DILATATION AND EVACUATION (Bilateral)  Patient Location: PACU  Anesthesia Type:General  Level of Consciousness: awake and sedated  Airway & Oxygen Therapy: Patient Spontanous Breathing and Patient connected to nasal cannula oxygen  Post-op Assessment: Report given to RN and Post -op Vital signs reviewed and stable  Post vital signs: stable  Last Vitals:  Filed Vitals:   07/14/15 2134  BP: 119/77  Pulse: 86  Temp: 36.7 C  Resp: 18    Complications: No apparent anesthesia complications

## 2015-07-14 NOTE — Op Note (Signed)
Preop diagnosis possible retained placental products Postop diagnosis retained   products Anesthesia general Surgeon Dr. Francoise Ceo Procedure on the general anesthesia perineum and vagina prepped and draped bladder emptied with a straight catheter bimanual exam revealed uterus to be 14-16 week size speculum placed in the vagina minimal amount of bleeding noted anterior lip of the cervix grasped with tenaculum the uterine cavity sounded 12 cm the cervix was open and admitted a #12 suction easily and   cavity was suctioned and moderate amount of tissue obtained cavity was clean and a sharp curettage performed and no further tissue obtained patient tolerated procedure well the blood loss was 75 cc taken to recovery room in good condition end of dictation dictated by Dr. Gaynell Face 07/14/2015

## 2015-07-15 ENCOUNTER — Encounter (HOSPITAL_COMMUNITY): Payer: Self-pay | Admitting: Obstetrics

## 2015-07-15 NOTE — Addendum Note (Signed)
Addendum  created 07/15/15 1251 by Jhonnie Garner, CRNA   Modules edited: Charges VN

## 2015-07-15 NOTE — Anesthesia Postprocedure Evaluation (Signed)
  Anesthesia Post-op Note  Patient: Leah Walls  Procedure(s) Performed: Procedure(s) (LRB): DILATATION AND EVACUATION (Bilateral)  Patient Location: PACU  Anesthesia Type: General  Level of Consciousness: awake and alert   Airway and Oxygen Therapy: Patient Spontanous Breathing  Post-op Pain: mild  Post-op Assessment: Post-op Vital signs reviewed, Patient's Cardiovascular Status Stable, Respiratory Function Stable, Patent Airway and No signs of Nausea or vomiting  Last Vitals:  Filed Vitals:   07/15/15 0015  BP: 117/68  Pulse: 66  Temp:   Resp: 16    Post-op Vital Signs: stable   Complications: No apparent anesthesia complications

## 2016-06-10 ENCOUNTER — Inpatient Hospital Stay (HOSPITAL_COMMUNITY)
Admission: AD | Admit: 2016-06-10 | Discharge: 2016-06-10 | Disposition: A | Discharge status: Home / Self Care | Payer: Medicaid Other | Source: Ambulatory Visit | Attending: Family Medicine | Admitting: Family Medicine

## 2016-06-10 ENCOUNTER — Inpatient Hospital Stay (HOSPITAL_COMMUNITY): Payer: Medicaid Other

## 2016-06-10 ENCOUNTER — Encounter (HOSPITAL_COMMUNITY): Payer: Self-pay | Admitting: *Deleted

## 2016-06-10 DIAGNOSIS — R109 Unspecified abdominal pain: Secondary | ICD-10-CM | POA: Diagnosis not present

## 2016-06-10 DIAGNOSIS — O9989 Other specified diseases and conditions complicating pregnancy, childbirth and the puerperium: Secondary | ICD-10-CM

## 2016-06-10 DIAGNOSIS — B9689 Other specified bacterial agents as the cause of diseases classified elsewhere: Secondary | ICD-10-CM | POA: Diagnosis not present

## 2016-06-10 DIAGNOSIS — O23591 Infection of other part of genital tract in pregnancy, first trimester: Secondary | ICD-10-CM | POA: Diagnosis not present

## 2016-06-10 DIAGNOSIS — Z3A01 Less than 8 weeks gestation of pregnancy: Secondary | ICD-10-CM | POA: Diagnosis not present

## 2016-06-10 DIAGNOSIS — O26891 Other specified pregnancy related conditions, first trimester: Secondary | ICD-10-CM | POA: Insufficient documentation

## 2016-06-10 DIAGNOSIS — R1030 Lower abdominal pain, unspecified: Secondary | ICD-10-CM | POA: Insufficient documentation

## 2016-06-10 DIAGNOSIS — Z885 Allergy status to narcotic agent status: Secondary | ICD-10-CM | POA: Insufficient documentation

## 2016-06-10 DIAGNOSIS — N760 Acute vaginitis: Secondary | ICD-10-CM

## 2016-06-10 DIAGNOSIS — Z87891 Personal history of nicotine dependence: Secondary | ICD-10-CM | POA: Insufficient documentation

## 2016-06-10 DIAGNOSIS — Z3491 Encounter for supervision of normal pregnancy, unspecified, first trimester: Secondary | ICD-10-CM

## 2016-06-10 DIAGNOSIS — O26899 Other specified pregnancy related conditions, unspecified trimester: Secondary | ICD-10-CM

## 2016-06-10 LAB — HIV ANTIBODY (ROUTINE TESTING W REFLEX): HIV Screen 4th Generation wRfx: NONREACTIVE

## 2016-06-10 LAB — URINALYSIS, ROUTINE W REFLEX MICROSCOPIC
Bilirubin Urine: NEGATIVE
Glucose, UA: NEGATIVE mg/dL
Hgb urine dipstick: NEGATIVE
Ketones, ur: NEGATIVE mg/dL
Nitrite: NEGATIVE
Protein, ur: NEGATIVE mg/dL
Specific Gravity, Urine: 1.02 (ref 1.005–1.030)
pH: 6 (ref 5.0–8.0)

## 2016-06-10 LAB — URINE MICROSCOPIC-ADD ON: RBC / HPF: NONE SEEN RBC/hpf (ref 0–5)

## 2016-06-10 LAB — CBC
HCT: 34 % — ABNORMAL LOW (ref 36.0–46.0)
Hemoglobin: 11.2 g/dL — ABNORMAL LOW (ref 12.0–15.0)
MCH: 28.9 pg (ref 26.0–34.0)
MCHC: 32.9 g/dL (ref 30.0–36.0)
MCV: 87.6 fL (ref 78.0–100.0)
Platelets: 268 10*3/uL (ref 150–400)
RBC: 3.88 MIL/uL (ref 3.87–5.11)
RDW: 13.2 % (ref 11.5–15.5)
WBC: 7.9 10*3/uL (ref 4.0–10.5)

## 2016-06-10 LAB — WET PREP, GENITAL
Sperm: NONE SEEN
Trich, Wet Prep: NONE SEEN
Yeast Wet Prep HPF POC: NONE SEEN

## 2016-06-10 LAB — HCG, QUANTITATIVE, PREGNANCY: hCG, Beta Chain, Quant, S: 9663 m[IU]/mL — ABNORMAL HIGH (ref <5)

## 2016-06-10 LAB — POCT PREGNANCY, URINE: Preg Test, Ur: POSITIVE — AB

## 2016-06-10 MED ORDER — METRONIDAZOLE 500 MG PO TABS: 500.0000 mg | ORAL_TABLET | Freq: Two times a day (BID) | ORAL | Status: DC

## 2016-06-10 NOTE — MAU Provider Note (Signed)
History     CSN: [id number]  Arrival date and time: 06/10/16 0607   First Provider Initiated Contact with Patient 06/10/16 0645      Chief Complaint  Patient presents with  . Abdominal Cramping   HPI   Ms.Leah Walls is a 30 y.o. female [phone] @ [id number] here with lower abdominal pain. The pain is located in her lower abdomen, on both sides. The pain is sharp and it worsens when she urinates. She tried taking some tylenol, however this did not help.  She denies vaginal bleeding at this time. She is having an odorous discharge.   OB History    Gravida Para Term Preterm AB TAB SAB Ectopic Multiple Living   6 5 4 1      0 5      Past Medical History  Diagnosis Date  . Headache     Past Surgical History  Procedure Laterality Date  . Cesarean section    . Forearm fracture surgery Right 2008  . Wisdom tooth extraction    . Dilation and evacuation Bilateral 07/14/2015    Procedure: DILATATION AND EVACUATION;  Surgeon: Kathreen CosierBernard A Marshall, MD;  Location: [facility];  Service: Gynecology;  Laterality: Bilateral;    Family History  Problem Relation Age of Onset  . Hypertension Mother   . Hypertension Maternal Grandmother   . Hypertension Paternal Grandmother     Social History  Substance Use Topics  . Smoking status: Former Smoker    Types: Cigarettes  . Smokeless tobacco: Never Used  . Alcohol Use: No    Allergies:  Allergies  Allergen Reactions  . Percocet [Oxycodone-Acetaminophen] Nausea And Vomiting    Can tolerate acetaminophen    Prescriptions prior to admission  Medication Sig Dispense Refill Last Dose  . acetaminophen (TYLENOL) 500 MG tablet Take 500 mg by mouth every 6 (six) hours as needed.   06/09/2016 at Unknown time   Results for orders placed or performed during the hospital encounter of 06/10/16 (from the past 48 hour(s))  Urinalysis, Routine w reflex microscopic ([facility])     Status: Abnormal   Collection Time: 06/10/16  6:30 AM  Result Value Ref  Range   Color, Urine YELLOW YELLOW   APPearance CLEAR CLEAR   Specific Gravity, Urine 1.020 1.005 - 1.030   pH 6.0 5.0 - 8.0   Glucose, UA NEGATIVE NEGATIVE mg/dL   Hgb urine dipstick NEGATIVE NEGATIVE   Bilirubin Urine NEGATIVE NEGATIVE   Ketones, ur NEGATIVE NEGATIVE mg/dL   Protein, ur NEGATIVE NEGATIVE mg/dL   Nitrite NEGATIVE NEGATIVE   Leukocytes, UA TRACE (A) NEGATIVE  Urine microscopic-add on     Status: Abnormal   Collection Time: 06/10/16  6:30 AM  Result Value Ref Range   Squamous Epithelial / LPF 6-30 (A) NONE SEEN   WBC, UA 6-30 0 - 5 WBC/hpf   RBC / HPF NONE SEEN 0 - 5 RBC/hpf   Bacteria, UA FEW (A) NONE SEEN   Urine-Other MUCOUS PRESENT   Pregnancy, urine POC     Status: Abnormal   Collection Time: 06/10/16  6:38 AM  Result Value Ref Range   Preg Test, Ur POSITIVE (A) NEGATIVE    Comment:        THE SENSITIVITY OF THIS METHODOLOGY IS >24 mIU/mL   Wet prep, genital     Status: Abnormal   Collection Time: 06/10/16  6:53 AM  Result Value Ref Range   Yeast Wet Prep HPF POC NONE SEEN NONE  SEEN   Trich, Wet Prep NONE SEEN NONE SEEN   Clue Cells Wet Prep HPF POC PRESENT (A) NONE SEEN   WBC, Wet Prep HPF POC FEW (A) NONE SEEN    Comment: MODERATE BACTERIA SEEN   Sperm NONE SEEN   CBC     Status: Abnormal   Collection Time: 06/10/16  7:03 AM  Result Value Ref Range   WBC 7.9 4.0 - 10.5 K/uL   RBC 3.88 3.87 - 5.11 MIL/uL   Hemoglobin 11.2 (L) 12.0 - 15.0 g/dL   HCT 62.1 (L) 30.8 - 65.7 %   MCV 87.6 78.0 - 100.0 fL   MCH 28.9 26.0 - 34.0 pg   MCHC 32.9 30.0 - 36.0 g/dL   RDW 84.6 96.2 - 95.2 %   Platelets 268 150 - 400 K/uL  hCG, quantitative, pregnancy     Status: Abnormal   Collection Time: 06/10/16  7:07 AM  Result Value Ref Range   hCG, Beta Chain, Quant, S 9663 (H) <5 mIU/mL    Comment:          GEST. AGE      CONC.  (mIU/mL)   <=1 WEEK        5 - 50     2 WEEKS       50 - 500     3 WEEKS       100 - 10,000     4 WEEKS     1,000 - 30,000     5 WEEKS      3,500 - 115,000   6-8 WEEKS     12,000 - 270,000    12 WEEKS     15,000 - 220,000        FEMALE AND NON-PREGNANT FEMALE:     LESS THAN 5 mIU/mL     Review of Systems  Constitutional: Negative for fever.  Gastrointestinal: Positive for nausea, vomiting and abdominal pain.  Genitourinary: Positive for dysuria (At the end of the stream). Negative for urgency, frequency and hematuria.   Physical Exam   Blood pressure 112/58, pulse 91, temperature 98.6 F (37 C), resp. rate 18, height  (1.549 m), weight 127 lb (57.607 kg), last menstrual period 04/29/2016, not currently breastfeeding.  Physical Exam  Constitutional: She is oriented to person, place, and time. She appears well-developed and well-nourished. No distress.  HENT:  Head: Normocephalic.  Eyes: Pupils are equal, round, and reactive to light.  Respiratory: Effort normal.  GI: Soft. There is no rigidity, no rebound and no guarding.  Genitourinary:  Speculum exam: Vagina - Small amount of creamy, pink discharge, no odor Cervix - No contact bleeding, no active bleeding  Bimanual exam: Cervix closed Uterus non tender, normal size Adnexa non tender, no masses bilaterally GC/Chlam, wet prep done Chaperone present for exam.  Musculoskeletal: Normal range of motion.  Neurological: She is alert and oriented to person, place, and time.  Skin: Skin is warm. She is not diaphoretic.  Psychiatric: Her behavior is normal.    MAU Course  Procedures  None  MDM  O positive blood type CBC Urine protein creatine ratio UA  Report given to Sofie Hartigan CNM who resumes care of the patient. Patient in [place]  Assessment and Plan   Results for orders placed or performed during the hospital encounter of 06/10/16 (from the past 48 hour(s))  Urinalysis, Routine w reflex microscopic (not at [facility])     Status: Abnormal   Collection Time: 06/10/16  6:30 AM  Result Value Ref Range   Color, Urine YELLOW YELLOW   APPearance CLEAR  CLEAR   Specific Gravity, Urine 1.020 1.005 - 1.030   pH 6.0 5.0 - 8.0   Glucose, UA NEGATIVE NEGATIVE mg/dL   Hgb urine dipstick NEGATIVE NEGATIVE   Bilirubin Urine NEGATIVE NEGATIVE   Ketones, ur NEGATIVE NEGATIVE mg/dL   Protein, ur NEGATIVE NEGATIVE mg/dL   Nitrite NEGATIVE NEGATIVE   Leukocytes, UA TRACE (A) NEGATIVE  Urine microscopic-add on     Status: Abnormal   Collection Time: 06/10/16  6:30 AM  Result Value Ref Range   Squamous Epithelial / LPF 6-30 (A) NONE SEEN   WBC, UA 6-30 0 - 5 WBC/hpf   RBC / HPF NONE SEEN 0 - 5 RBC/hpf   Bacteria, UA FEW (A) NONE SEEN   Urine-Other MUCOUS PRESENT   Pregnancy, urine POC     Status: Abnormal   Collection Time: 06/10/16  6:38 AM  Result Value Ref Range   Preg Test, Ur POSITIVE (A) NEGATIVE    Comment:        THE SENSITIVITY OF THIS METHODOLOGY IS >24 mIU/mL   Wet prep, genital     Status: Abnormal   Collection Time: 06/10/16  6:53 AM  Result Value Ref Range   Yeast Wet Prep HPF POC NONE SEEN NONE SEEN   Trich, Wet Prep NONE SEEN NONE SEEN   Clue Cells Wet Prep HPF POC PRESENT (A) NONE SEEN   WBC, Wet Prep HPF POC FEW (A) NONE SEEN    Comment: MODERATE BACTERIA SEEN   Sperm NONE SEEN   CBC     Status: Abnormal   Collection Time: 06/10/16  7:03 AM  Result Value Ref Range   WBC 7.9 4.0 - 10.5 K/uL   RBC 3.88 3.87 - 5.11 MIL/uL   Hemoglobin 11.2 (L) 12.0 - 15.0 g/dL   HCT 91.434.0 (L) 78.236.0 - 95.646.0 %   MCV 87.6 78.0 - 100.0 fL   MCH 28.9 26.0 - 34.0 pg   MCHC 32.9 30.0 - 36.0 g/dL   RDW 21.313.2 08.611.5 - 57.815.5 %   Platelets 268 150 - 400 K/uL  hCG, quantitative, pregnancy     Status: Abnormal   Collection Time: 06/10/16  7:07 AM  Result Value Ref Range   hCG, Beta Chain, Quant, S 9663 (H) <5 mIU/mL    Comment:          GEST. AGE      CONC.  (mIU/mL)   <=1 WEEK        5 - 50     2 WEEKS       50 - 500     3 WEEKS       100 - 10,000     4 WEEKS     1,000 - 30,000     5 WEEKS     3,500 - 115,000   6-8 WEEKS     12,000 - 270,000     12 WEEKS     15,000 - 220,000        FEMALE AND NON-PREGNANT FEMALE:     LESS THAN 5 mIU/mL    [place] Ob Comp Less 14 Wks  06/10/2016  CLINICAL DATA:  30 year old pregnant female with cramping, abdominal pain and spotting. Quantitative beta HCG [id number]. EDC by LMP: 02/03/2017, projecting to an expected gestational age of [redacted] weeks 0 days. EXAM: OBSTETRIC <14 WK US AND TRANSVAGINAL OB US TECHNIQUE: Both transabdominal and transvaginal ultrasound examinations were  performed for complete evaluation of the gestation as well as the maternal uterus, adnexal regions, and pelvic cul-de-sac. Transvaginal technique was performed to assess early pregnancy. COMPARISON:  No prior scans from this gestation. FINDINGS: Intrauterine gestational sac: Single intrauterine gestational sac appears normal in size, shape and position. Yolk sac:  Present. Embryo:  Present. Embryonic Cardiac Activity: Within normal limits for gestational age. Embryonic Heart Rate: 103  bpm MSD: 10.1 mm 5 w 5 d [place] EDC: 02/05/2017 (by mean sac diameter) CRL:  1.4  mm   less than 5 w Subchorionic hemorrhage:  None visualized. Maternal uterus/adnexae: Right ovary measures 3.2 x 2.4 x 2.6 cm and contains a 1.7 cm corpus luteum. Left ovary measures 2.7 x 1.5 x 2.2 cm. No suspicious ovarian or adnexal masses. Small volume simple free fluid in the pelvis. No uterine fibroids are demonstrated. IMPRESSION: 1. Single living intrauterine gestation measuring less than [redacted] weeks gestational age by crown-rump length, and measuring 5 weeks 5 days by mean sac diameter. 2. Embryonic heart rate 103 beats per minute, within normal limits for early gestational age. No perigestational bleed. 3. No ovarian or adnexal abnormality. Electronically Signed   By: Delbert Phenix M.D.   On: 06/10/2016 08:57   US Ob Transvaginal  06/10/2016  CLINICAL DATA:  30 year old pregnant female with cramping, abdominal pain and spotting. Quantitative beta HCG [id number]. EDC by LMP: 02/03/2017, projecting  to an expected gestational age of [redacted] weeks 0 days. EXAM: OBSTETRIC <14 WK [place] AND TRANSVAGINAL OB US TECHNIQUE: Both transabdominal and transvaginal ultrasound examinations were performed for complete evaluation of the gestation as well as the maternal uterus, adnexal regions, and pelvic cul-de-sac. Transvaginal technique was performed to assess early pregnancy. COMPARISON:  No prior scans from this gestation. FINDINGS: Intrauterine gestational sac: Single intrauterine gestational sac appears normal in size, shape and position. Yolk sac:  Present. Embryo:  Present. Embryonic Cardiac Activity: Within normal limits for gestational age. Embryonic Heart Rate: 103  bpm MSD: 10.1 mm 5 w 5 d [place] EDC: 02/05/2017 (by mean sac diameter) CRL:  1.4  mm   less than 5 w Subchorionic hemorrhage:  None visualized. Maternal uterus/adnexae: Right ovary measures 3.2 x 2.4 x 2.6 cm and contains a 1.7 cm corpus luteum. Left ovary measures 2.7 x 1.5 x 2.2 cm. No suspicious ovarian or adnexal masses. Small volume simple free fluid in the pelvis. No uterine fibroids are demonstrated. IMPRESSION: 1. Single living intrauterine gestation measuring less than [redacted] weeks gestational age by crown-rump length, and measuring 5 weeks 5 days by mean sac diameter. 2. Embryonic heart rate 103 beats per minute, within normal limits for early gestational age. No perigestational bleed. 3. No ovarian or adnexal abnormality. Electronically Signed   By: Delbert Phenix M.D.   On: 06/10/2016 08:57    1. Normal IUP (intrauterine pregnancy) on prenatal ultrasound, first trimester   2. Abdominal pain in pregnancy, antepartum   3. Bacterial vaginosis     D/C home with first trimester pregnancy precautions Flagyl 500 mg BID x 7 days sent to pharmacy Urine sent for culture F/U with early prenatal care--list given Return to MAU as needed for emergencies  LEFTWICH-KIRBY, Leland Staszewski, CNM 9:11 AM

## 2016-06-10 NOTE — MAU Note (Signed)
Having a lot of abd cramping for a wk. When I pee really hurts at the end. Spotted some Friday but has stopped. LMP 5/13

## 2016-06-10 NOTE — Discharge Instructions (Signed)
Bacterial Vaginosis °Bacterial vaginosis is a vaginal infection that occurs when the normal balance of bacteria in the vagina is disrupted. It results from an overgrowth of certain bacteria. This is the most common vaginal infection in women of childbearing age. Treatment is important to prevent complications, especially in pregnant women, as it can cause a premature delivery. °CAUSES  °Bacterial vaginosis is caused by an increase in harmful bacteria that are normally present in smaller amounts in the vagina. Several different kinds of bacteria can cause bacterial vaginosis. However, the reason that the condition develops is not fully understood. °RISK FACTORS °Certain activities or behaviors can put you at an increased risk of developing bacterial vaginosis, including: °· Having a new sex partner or multiple sex partners. °· Douching. °· Using an intrauterine device (IUD) for contraception. °Women do not get bacterial vaginosis from toilet seats, bedding, swimming pools, or contact with objects around them. °SIGNS AND SYMPTOMS  °Some women with bacterial vaginosis have no signs or symptoms. Common symptoms include: °· Grey vaginal discharge. °· A fishlike odor with discharge, especially after sexual intercourse. °· Itching or burning of the vagina and vulva. °· Burning or pain with urination. °DIAGNOSIS  °Your health care provider will take a medical history and examine the vagina for signs of bacterial vaginosis. A sample of vaginal fluid may be taken. Your health care provider will look at this sample under a microscope to check for bacteria and abnormal cells. A vaginal pH test may also be done.  °TREATMENT  °Bacterial vaginosis may be treated with antibiotic medicines. These may be given in the form of a pill or a vaginal cream. A second round of antibiotics may be prescribed if the condition comes back after treatment. Because bacterial vaginosis increases your risk for sexually transmitted diseases, getting  treated can help reduce your risk for chlamydia, gonorrhea, HIV, and herpes. °HOME CARE INSTRUCTIONS  °· Only take over-the-counter or prescription medicines as directed by your health care provider. °· If antibiotic medicine was prescribed, take it as directed. Make sure you finish it even if you start to feel better. °· Tell all sexual partners that you have a vaginal infection. They should see their health care provider and be treated if they have problems, such as a mild rash or itching. °· During treatment, it is important that you follow these instructions: °· Avoid sexual activity or use condoms correctly. °· Do not douche. °· Avoid alcohol as directed by your health care provider. °· Avoid breastfeeding as directed by your health care provider. °SEEK MEDICAL CARE IF:  °· Your symptoms are not improving after 3 days of treatment. °· You have increased discharge or pain. °· You have a fever. °MAKE SURE YOU:  °· Understand these instructions. °· Will watch your condition. °· Will get help right away if you are not doing well or get worse. °FOR MORE INFORMATION  °Centers for Disease Control and Prevention, Division of STD Prevention: www.cdc.gov/std °American Sexual Health Association (ASHA): www.ashastd.org  °  °This information is not intended to replace advice given to you by your health care provider. Make sure you discuss any questions you have with your health care provider. °  °Document Released: 12/04/2005 Document Revised: 12/25/2014 Document Reviewed: 07/16/2013 °Elsevier Interactive Patient Education ©2016 [employer] °First Trimester of Pregnancy °The first trimester of pregnancy is from week 1 until the end of week 12 (months 1 through 3). A week after a sperm fertilizes an egg, the egg will implant on the   wall of the uterus. This embryo will begin to develop into a baby. Genes from you and your partner are forming the baby. The female genes determine whether the baby is a boy or a girl. At 6-8  weeks, the eyes and face are formed, and the heartbeat can be seen on ultrasound. At the end of 12 weeks, all the baby's organs are formed.  °Now that you are pregnant, you will want to do everything you can to have a healthy baby. Two of the most important things are to get good prenatal care and to follow your health care provider's instructions. Prenatal care is all the medical care you receive before the baby's birth. This care will help prevent, find, and treat any problems during the pregnancy and childbirth. °BODY CHANGES °Your body goes through many changes during pregnancy. The changes vary from woman to woman.  °· You may gain or lose a couple of pounds at first. °· You may feel sick to your stomach (nauseous) and throw up (vomit). If the vomiting is uncontrollable, call your health care provider. °· You may tire easily. °· You may develop headaches that can be relieved by medicines approved by your health care provider. °· You may urinate more often. Painful urination may mean you have a bladder infection. °· You may develop heartburn as a result of your pregnancy. °· You may develop constipation because certain hormones are causing the muscles that push waste through your intestines to slow down. °· You may develop hemorrhoids or swollen, bulging veins (varicose veins). °· Your breasts may begin to grow larger and become tender. Your nipples may stick out more, and the tissue that surrounds them (areola) may become darker. °· Your gums may bleed and may be sensitive to brushing and flossing. °· Dark spots or blotches (chloasma, mask of pregnancy) may develop on your face. This will likely fade after the baby is born. °· Your menstrual periods will stop. °· You may have a loss of appetite. °· You may develop cravings for certain kinds of food. °· You may have changes in your emotions from day to day, such as being excited to be pregnant or being concerned that something may go wrong with the pregnancy and  baby. °· You may have more vivid and strange dreams. °· You may have changes in your hair. These can include thickening of your hair, rapid growth, and changes in texture. Some women also have hair loss during or after pregnancy, or hair that feels dry or thin. Your hair will most likely return to normal after your baby is born. °WHAT TO EXPECT AT YOUR PRENATAL VISITS °During a routine prenatal visit: °· You will be weighed to make sure you and the baby are growing normally. °· Your blood pressure will be taken. °· Your abdomen will be measured to track your baby's growth. °· The fetal heartbeat will be listened to starting around week 10 or 12 of your pregnancy. °· Test results from any previous visits will be discussed. °Your health care provider may ask you: °· How you are feeling. °· If you are feeling the baby move. °· If you have had any abnormal symptoms, such as leaking fluid, bleeding, severe headaches, or abdominal cramping. °· If you are using any tobacco products, including cigarettes, chewing tobacco, and electronic cigarettes. °· If you have any questions. °Other tests that may be performed during your first trimester include: °· Blood tests to find your blood type and to check for   the presence of any previous infections. They will also be used to check for low iron levels (anemia) and Rh antibodies. Later in the pregnancy, blood tests for diabetes will be done along with other tests if problems develop. °· Urine tests to check for infections, diabetes, or protein in the urine. °· An ultrasound to confirm the proper growth and development of the baby. °· An amniocentesis to check for possible genetic problems. °· Fetal screens for spina bifida and Down syndrome. °· You may need other tests to make sure you and the baby are doing well. °· HIV (human immunodeficiency virus) testing. Routine prenatal testing includes screening for HIV, unless you choose not to have this test. °HOME CARE INSTRUCTIONS    °Medicines °· Follow your health care provider's instructions regarding medicine use. Specific medicines may be either safe or unsafe to take during pregnancy. °· Take your prenatal vitamins as directed. °· If you develop constipation, try taking a stool softener if your health care provider approves. °Diet °· Eat regular, well-balanced meals. Choose a variety of foods, such as meat or vegetable-based protein, fish, milk and low-fat dairy products, vegetables, fruits, and whole grain breads and cereals. Your health care provider will help you determine the amount of weight gain that is right for you. °· Avoid raw meat and uncooked cheese. These carry germs that can cause birth defects in the baby. °· Eating four or five small meals rather than three large meals a day may help relieve nausea and vomiting. If you start to feel nauseous, eating a few soda crackers can be helpful. Drinking liquids between meals instead of during meals also seems to help nausea and vomiting. °· If you develop constipation, eat more high-fiber foods, such as fresh vegetables or fruit and whole grains. Drink enough fluids to keep your urine clear or pale yellow. °Activity and Exercise °· Exercise only as directed by your health care provider. Exercising will help you: °¨ Control your weight. °¨ Stay in shape. °¨ Be prepared for labor and delivery. °· Experiencing pain or cramping in the lower abdomen or low back is a good sign that you should stop exercising. Check with your health care provider before continuing normal exercises. °· Try to avoid standing for long periods of time. Move your legs often if you must stand in one place for a long time. °· Avoid heavy lifting. °· Wear low-heeled shoes, and practice good posture. °· You may continue to have sex unless your health care provider directs you otherwise. °Relief of Pain or Discomfort °· Wear a good support bra for breast tenderness.   °· Take warm sitz baths to soothe any pain or  discomfort caused by hemorrhoids. Use hemorrhoid cream if your health care provider approves.   °· Rest with your legs elevated if you have leg cramps or low back pain. °· If you develop varicose veins in your legs, wear support hose. Elevate your feet for 15 minutes, 3-4 times a day. Limit salt in your diet. °Prenatal Care °· Schedule your prenatal visits by the twelfth week of pregnancy. They are usually scheduled monthly at first, then more often in the last 2 months before delivery. °· Write down your questions. Take them to your prenatal visits. °· Keep all your prenatal visits as directed by your health care provider. °Safety °· Wear your seat belt at all times when driving. °· Make a list of emergency phone numbers, including numbers for family, friends, the hospital, and police and fire departments. °General   Tips °· Ask your health care provider for a referral to a local prenatal education class. Begin classes no later than at the beginning of month 6 of your pregnancy. °· Ask for help if you have counseling or nutritional needs during pregnancy. Your health care provider can offer advice or refer you to specialists for help with various needs. °· Do not use hot tubs, steam rooms, or saunas. °· Do not douche or use tampons or scented sanitary pads. °· Do not cross your legs for long periods of time. °· Avoid cat litter boxes and soil used by cats. These carry germs that can cause birth defects in the baby and possibly loss of the fetus by miscarriage or stillbirth. °· Avoid all smoking, herbs, alcohol, and medicines not prescribed by your health care provider. Chemicals in these affect the formation and growth of the baby. °· Do not use any tobacco products, including cigarettes, chewing tobacco, and electronic cigarettes. If you need help quitting, ask your health care provider. You may receive counseling support and other resources to help you quit. °· Schedule a dentist appointment. At home, brush your  teeth with a soft toothbrush and be gentle when you floss. °SEEK MEDICAL CARE IF:  °· You have dizziness. °· You have mild pelvic cramps, pelvic pressure, or nagging pain in the abdominal area. °· You have persistent nausea, vomiting, or diarrhea. °· You have a bad smelling vaginal discharge. °· You have pain with urination. °· You notice increased swelling in your face, hands, legs, or ankles. °SEEK IMMEDIATE MEDICAL CARE IF:  °· You have a fever. °· You are leaking fluid from your vagina. °· You have spotting or bleeding from your vagina. °· You have severe abdominal cramping or pain. °· You have rapid weight gain or loss. °· You vomit blood or material that looks like coffee grounds. °· You are exposed to German measles and have never had them. °· You are exposed to fifth disease or chickenpox. °· You develop a severe headache. °· You have shortness of breath. °· You have any kind of trauma, such as from a fall or a car accident. °  °This information is not intended to replace advice given to you by your health care provider. Make sure you discuss any questions you have with your health care provider. °  °Document Released: 11/28/2001 Document Revised: 12/25/2014 Document Reviewed: 10/14/2013 °Elsevier Interactive Patient Education ©2016 [employer] Inc. ° °

## 2016-06-12 LAB — GC/CHLAMYDIA PROBE AMP (~~LOC~~) NOT AT ARMC
Chlamydia: NEGATIVE
Neisseria Gonorrhea: NEGATIVE

## 2016-06-13 ENCOUNTER — Telehealth: Payer: Self-pay | Admitting: Advanced Practice Midwife

## 2016-06-13 LAB — CULTURE, OB URINE: Culture: >=100000 — AB

## 2016-06-13 MED ORDER — CEPHALEXIN 500 MG PO CAPS: 500.0000 mg | ORAL_CAPSULE | Freq: Four times a day (QID) | ORAL | Status: DC

## 2016-06-13 NOTE — Telephone Encounter (Signed)
Pt returned call regarding test results.  Notified pt of UTI and Rx at her pharmacy. Pt states understanding.

## 2016-06-13 NOTE — Telephone Encounter (Signed)
Attempt to call pt to let her know about positive urine culture and Rx for Keflex sent to [facility] on [address] but pt mailbox is full.

## 2016-07-24 ENCOUNTER — Ambulatory Visit (INDEPENDENT_AMBULATORY_CARE_PROVIDER_SITE_OTHER): Payer: Medicaid Other | Admitting: Obstetrics & Gynecology

## 2016-07-24 ENCOUNTER — Encounter: Payer: Self-pay | Admitting: Obstetrics & Gynecology

## 2016-07-24 ENCOUNTER — Other Ambulatory Visit (HOSPITAL_COMMUNITY)
Admission: RE | Admit: 2016-07-24 | Discharge: 2016-07-24 | Disposition: A | Discharge status: Home / Self Care | Payer: Medicaid Other | Source: Ambulatory Visit | Attending: Obstetrics & Gynecology | Admitting: Obstetrics & Gynecology

## 2016-07-24 DIAGNOSIS — Z113 Encounter for screening for infections with a predominantly sexual mode of transmission: Secondary | ICD-10-CM

## 2016-07-24 DIAGNOSIS — O09899 Supervision of other high risk pregnancies, unspecified trimester: Secondary | ICD-10-CM

## 2016-07-24 DIAGNOSIS — O34219 Maternal care for unspecified type scar from previous cesarean delivery: Secondary | ICD-10-CM

## 2016-07-24 DIAGNOSIS — O0940 Supervision of pregnancy with grand multiparity, unspecified trimester: Secondary | ICD-10-CM | POA: Insufficient documentation

## 2016-07-24 DIAGNOSIS — Z01419 Encounter for gynecological examination (general) (routine) without abnormal findings: Secondary | ICD-10-CM | POA: Insufficient documentation

## 2016-07-24 DIAGNOSIS — N6312 Unspecified lump in the right breast, upper inner quadrant: Secondary | ICD-10-CM | POA: Insufficient documentation

## 2016-07-24 DIAGNOSIS — O0990 Supervision of high risk pregnancy, unspecified, unspecified trimester: Secondary | ICD-10-CM | POA: Insufficient documentation

## 2016-07-24 DIAGNOSIS — N63 Unspecified lump in breast: Secondary | ICD-10-CM | POA: Diagnosis not present

## 2016-07-24 DIAGNOSIS — O0941 Supervision of pregnancy with grand multiparity, first trimester: Secondary | ICD-10-CM | POA: Diagnosis not present

## 2016-07-24 DIAGNOSIS — Z124 Encounter for screening for malignant neoplasm of cervix: Secondary | ICD-10-CM | POA: Diagnosis not present

## 2016-07-24 DIAGNOSIS — Z98891 History of uterine scar from previous surgery: Secondary | ICD-10-CM | POA: Insufficient documentation

## 2016-07-24 DIAGNOSIS — Z8759 Personal history of other complications of pregnancy, childbirth and the puerperium: Secondary | ICD-10-CM | POA: Insufficient documentation

## 2016-07-24 DIAGNOSIS — Z3491 Encounter for supervision of normal pregnancy, unspecified, first trimester: Secondary | ICD-10-CM | POA: Diagnosis present

## 2016-07-24 DIAGNOSIS — Z3490 Encounter for supervision of normal pregnancy, unspecified, unspecified trimester: Secondary | ICD-10-CM | POA: Insufficient documentation

## 2016-07-24 DIAGNOSIS — Z1151 Encounter for screening for human papillomavirus (HPV): Secondary | ICD-10-CM | POA: Diagnosis not present

## 2016-07-24 LAB — POCT URINALYSIS DIP (DEVICE)
Bilirubin Urine: NEGATIVE
Glucose, UA: NEGATIVE mg/dL
Ketones, ur: NEGATIVE mg/dL
Leukocytes, UA: NEGATIVE
Nitrite: NEGATIVE
Protein, ur: 100 mg/dL — AB
Specific Gravity, Urine: 1.025 (ref 1.005–1.030)
Urobilinogen, UA: 0.2 mg/dL (ref 0.0–1.0)
pH: 6 (ref 5.0–8.0)

## 2016-07-24 NOTE — Progress Notes (Signed)
Subjective:    Leah Walls is a  30 y.o. [phone] at 325w2d by LMP being seen today for her first obstetrical visit.  Her obstetrical history is significant for short interval since last pregnancy; previous cesarean section followed by VBAC x 4. Patient does intend to breast feed. Pregnancy history fully reviewed.  Patient reports R breast lump palpated sicne 2 months ago. She is concerned and wants evaluation.  Vitals:   07/24/16 0937  BP: 115/66  Pulse: 79  Weight: 129 lb (58.5 kg)    HISTORY: OB History  Gravida Para Term Preterm AB Living  6 5 4 1   5   SAB TAB Ectopic Multiple Live Births        0 4    # Outcome Date GA Lbr Len/2nd Weight Sex Delivery Anes PTL Lv  6 Current           5 Term 07/07/15 3561w1d 18:20 / 00:20 6 lb 14.1 oz (3.12 kg) F VBAC EPI  LIV  4 Term 03/11/09    F Vag-Spont   LIV  3 Preterm 12/02/07 30107w0d   M Vag-Spont   LIV  2 Term 04/08/05    M Vag-Spont     1 Term 02/01/03    Leah FerdinandM CS-LTranv   LIV     Past Medical History:  Diagnosis Date  . Headache    Past Surgical History:  Procedure Laterality Date  . CESAREAN SECTION    . DILATION AND EVACUATION Bilateral 07/14/2015   Procedure: DILATATION AND EVACUATION;  Surgeon: Kathreen CosierBernard A Marshall, MD;  Location: [facility];  Service: Gynecology;  Laterality: Bilateral;  . FOREARM FRACTURE SURGERY Right 2008  . WISDOM TOOTH EXTRACTION     Family History  Problem Relation Age of Onset  . Hypertension Mother   . Hypertension Maternal Grandmother   . Hypertension Paternal Grandmother      Exam    Uterus:     Pelvic Exam:    Perineum: No Hemorrhoids, Normal Perineum   Vulva: normal   Vagina:  normal mucosa, normal discharge   Cervix: multiparous appearance, no bleeding following Pap, no cervical motion tenderness and no lesions   Adnexa: normal adnexa and no mass, fullness, tenderness   Bony Pelvis: average  System: Breast:  1.5 cm mobile, rounded mass palpated around 1 o'clock about 1 cm from areolar  border. Non tender. No other masses, skin changes or lymphadenopathy bilaterally.   Skin: normal coloration and turgor, no rashes   Neurologic: oriented, normal, negative   Extremities: normal strength, tone, and muscle mass   HEENT PERRLA, extra ocular movement intact and sclera clear, anicteric   Mouth/Teeth mucous membranes moist, pharynx normal without lesions and dental hygiene good   Neck supple and no masses   Cardiovascular: regular rate and rhythm   Respiratory:  appears well, vitals normal, no respiratory distress, acyanotic, normal RR, chest clear, no wheezing, crepitations, rhonchi, normal symmetric air entry   Abdomen: soft, non-tender; bowel sounds normal; no masses,  no organomegaly   Urinary: urethral meatus normal   Doppler: FHR 165   Assessment:    Pregnancy: [id number] Patient Active Problem List   Diagnosis Date Noted  . Supervision of normal pregnancy 07/24/2016  . Short interval between pregnancies affecting pregnancy, antepartum 07/24/2016  . Previous cesarean delivery, antepartum 07/24/2016  . Grand multiparity, antepartum 07/24/2016  . Breast lump on right side at 1 o'clock position 07/24/2016    Plan:   1. Short interval between pregnancies affecting pregnancy,  antepartum Patient desires IUD vs Nexplanon for BCM  2. Previous cesarean delivery, antepartum Desires TOLAC, had VBAC x 4  3. Breast lump on right side at 1 o'clock position Will obtain imaging, will follow up results and manage accordingly. - MM DIAG BREAST TOMO UNI RIGHT; Future - US BREAST LTD UNI RIGHT INC AXILLA; Future  4. Grand multiparity, antepartum, first trimester Increased risk of PPH.  5. Supervision of normal pregnancy, first trimester - Cytology - PAP - GC/Chlamydia probe amp ([facility])not at [facility] - Hemoglobinopathy Evaluation - Culture, OB Urine - Pain Mgmt, Profile 6 Conf w/o mM, U - Prenatal Profile - [place] MFM Fetal Nuchal Translucency; Future - [place] MFM OB COMP + 14  WK; Future  Initial labs drawn. Continue Prenatal vitamins. Problem list reviewed and updated. Genetic Screening discussed First Screen: ordered. Ultrasound discussed; fetal survey: ordered. Follow up in 4 weeks.  Routine obstetric precautions reviewed.   Tereso Newcomer, MD 07/24/2016

## 2016-07-24 NOTE — Progress Notes (Signed)
First Trimester Screen scheduled for 07/26/16 @ 0900.  Breast US scheduled for 08/31/16 @ 1440.  Pt notified of both appts.

## 2016-07-24 NOTE — Patient Instructions (Signed)
Thank you for enrolling in MyChart. Please follow the instructions below to securely access your online medical record. MyChart allows you to send messages to your doctor, view your test results, manage appointments, and more.   How Do I Sign Up? 1. In your Internet browser, go to [employer] and enter [email or web address]. 2. Click on the Sign Up Now link in the Sign In box. You will see the New Member Sign Up page. 3. Enter your MyChart Access Code exactly as it appears below. You will not need to use this code after you've completed the sign-up process. If you do not sign up before the expiration date, you must request a new code.  MyChart Access Code: [email or web address] Expires: 08/09/2016  6:26 AM  4. Enter your Social Security Number ([id number]) and Date of Birth (mm/dd/yyyy) as indicated and click Submit. You will be taken to the next sign-up page. 5. Create a MyChart ID. This will be your MyChart login ID and cannot be changed, so think of one that is secure and easy to remember. 6. Create a MyChart password. You can change your password at any time. 7. Enter your Password Reset Question and Answer. This can be used at a later time if you forget your password.  8. Enter your e-mail address. You will receive e-mail notification when new information is available in MyChart. 9. Click Sign Up. You can now view your medical record.   Additional Information Remember, MyChart is NOT to be used for urgent needs. For medical emergencies, dial 911.   First Trimester of Pregnancy The first trimester of pregnancy is from week 1 until the end of week 12 (months 1 through 3). A week after a sperm fertilizes an egg, the egg will implant on the wall of the uterus. This embryo will begin to develop into a baby. Genes from you and your partner are forming the baby. The female genes determine whether the baby is a boy or a girl. At 6-8 weeks, the eyes and face are formed, and the heartbeat can  be seen on ultrasound. At the end of 12 weeks, all the baby's organs are formed.  Now that you are pregnant, you will want to do everything you can to have a healthy baby. Two of the most important things are to get good prenatal care and to follow your health care provider's instructions. Prenatal care is all the medical care you receive before the baby's birth. This care will help prevent, find, and treat any problems during the pregnancy and childbirth. BODY CHANGES Your body goes through many changes during pregnancy. The changes vary from woman to woman.   You may gain or lose a couple of pounds at first.  You may feel sick to your stomach (nauseous) and throw up (vomit). If the vomiting is uncontrollable, call your health care provider.  You may tire easily.  You may develop headaches that can be relieved by medicines approved by your health care provider.  You may urinate more often. Painful urination may mean you have a bladder infection.  You may develop heartburn as a result of your pregnancy.  You may develop constipation because certain hormones are causing the muscles that push waste through your intestines to slow down.  You may develop hemorrhoids or swollen, bulging veins (varicose veins).  Your breasts may begin to grow larger and become tender. Your nipples may stick out more, and the tissue that surrounds them (areola) may become darker.  Your gums may  bleed and may be sensitive to brushing and flossing.  Dark spots or blotches (chloasma, mask of pregnancy) may develop on your face. This will likely fade after the baby is born.  Your menstrual periods will stop.  You may have a loss of appetite.  You may develop cravings for certain kinds of food.  You may have changes in your emotions from day to day, such as being excited to be pregnant or being concerned that something may go wrong with the pregnancy and baby.  You may have more vivid and strange  dreams.  You may have changes in your hair. These can include thickening of your hair, rapid growth, and changes in texture. Some women also have hair loss during or after pregnancy, or hair that feels dry or thin. Your hair will most likely return to normal after your baby is born. WHAT TO EXPECT AT YOUR PRENATAL VISITS During a routine prenatal visit:  You will be weighed to make sure you and the baby are growing normally.  Your blood pressure will be taken.  Your abdomen will be measured to track your baby's growth.  The fetal heartbeat will be listened to starting around week 10 or 12 of your pregnancy.  Test results from any previous visits will be discussed. Your health care provider may ask you:  How you are feeling.  If you are feeling the baby move.  If you have had any abnormal symptoms, such as leaking fluid, bleeding, severe headaches, or abdominal cramping.  If you are using any tobacco products, including cigarettes, chewing tobacco, and electronic cigarettes.  If you have any questions. Other tests that may be performed during your first trimester include:  Blood tests to find your blood type and to check for the presence of any previous infections. They will also be used to check for low iron levels (anemia) and Rh antibodies. Later in the pregnancy, blood tests for diabetes will be done along with other tests if problems develop.  Urine tests to check for infections, diabetes, or protein in the urine.  An ultrasound to confirm the proper growth and development of the baby.  An amniocentesis to check for possible genetic problems.  Fetal screens for spina bifida and Down syndrome.  You may need other tests to make sure you and the baby are doing well.  HIV (human immunodeficiency virus) testing. Routine prenatal testing includes screening for HIV, unless you choose not to have this test. HOME CARE INSTRUCTIONS  Medicines  Follow your health care provider's  instructions regarding medicine use. Specific medicines may be either safe or unsafe to take during pregnancy.  Take your prenatal vitamins as directed.  If you develop constipation, try taking a stool softener if your health care provider approves. Diet  Eat regular, well-balanced meals. Choose a variety of foods, such as meat or vegetable-based protein, fish, milk and low-fat dairy products, vegetables, fruits, and whole grain breads and cereals. Your health care provider will help you determine the amount of weight gain that is right for you.  Avoid raw meat and uncooked cheese. These carry germs that can cause birth defects in the baby.  Eating four or five small meals rather than three large meals a day may help relieve nausea and vomiting. If you start to feel nauseous, eating a few soda crackers can be helpful. Drinking liquids between meals instead of during meals also seems to help nausea and vomiting.  If you develop constipation, eat more high-fiber foods, such  as fresh vegetables or fruit and whole grains. Drink enough fluids to keep your urine clear or pale yellow. Activity and Exercise  Exercise only as directed by your health care provider. Exercising will help you:  Control your weight.  Stay in shape.  Be prepared for labor and delivery.  Experiencing pain or cramping in the lower abdomen or low back is a good sign that you should stop exercising. Check with your health care provider before continuing normal exercises.  Try to avoid standing for long periods of time. Move your legs often if you must stand in one place for a long time.  Avoid heavy lifting.  Wear low-heeled shoes, and practice good posture.  You may continue to have sex unless your health care provider directs you otherwise. Relief of Pain or Discomfort  Wear a good support bra for breast tenderness.   Take warm sitz baths to soothe any pain or discomfort caused by hemorrhoids. Use hemorrhoid  cream if your health care provider approves.   Rest with your legs elevated if you have leg cramps or low back pain.  If you develop varicose veins in your legs, wear support hose. Elevate your feet for 15 minutes, 3-4 times a day. Limit salt in your diet. Prenatal Care  Schedule your prenatal visits by the twelfth week of pregnancy. They are usually scheduled monthly at first, then more often in the last 2 months before delivery.  Write down your questions. Take them to your prenatal visits.  Keep all your prenatal visits as directed by your health care provider. Safety  Wear your seat belt at all times when driving.  Make a list of emergency phone numbers, including numbers for family, friends, the hospital, and police and fire departments. General Tips  Ask your health care provider for a referral to a local prenatal education class. Begin classes no later than at the beginning of month 6 of your pregnancy.  Ask for help if you have counseling or nutritional needs during pregnancy. Your health care provider can offer advice or refer you to specialists for help with various needs.  Do not use hot tubs, steam rooms, or saunas.  Do not douche or use tampons or scented sanitary pads.  Do not cross your legs for long periods of time.  Avoid cat litter boxes and soil used by cats. These carry germs that can cause birth defects in the baby and possibly loss of the fetus by miscarriage or stillbirth.  Avoid all smoking, herbs, alcohol, and medicines not prescribed by your health care provider. Chemicals in these affect the formation and growth of the baby.  Do not use any tobacco products, including cigarettes, chewing tobacco, and electronic cigarettes. If you need help quitting, ask your health care provider. You may receive counseling support and other resources to help you quit.  Schedule a dentist appointment. At home, brush your teeth with a soft toothbrush and be gentle when you  floss. SEEK MEDICAL CARE IF:   You have dizziness.  You have mild pelvic cramps, pelvic pressure, or nagging pain in the abdominal area.  You have persistent nausea, vomiting, or diarrhea.  You have a bad smelling vaginal discharge.  You have pain with urination.  You notice increased swelling in your face, hands, legs, or ankles. SEEK IMMEDIATE MEDICAL CARE IF:   You have a fever.  You are leaking fluid from your vagina.  You have spotting or bleeding from your vagina.  You have severe abdominal cramping  or pain.  You have rapid weight gain or loss.  You vomit blood or material that looks like coffee grounds.  You are exposed to [place] measles and have never had them.  You are exposed to fifth disease or chickenpox.  You develop a severe headache.  You have shortness of breath.  You have any kind of trauma, such as from a fall or a car accident.   This information is not intended to replace advice given to you by your health care provider. Make sure you discuss any questions you have with your health care provider.   Document Released: 11/28/2001 Document Revised: 12/25/2014 Document Reviewed: 10/14/2013 Elsevier Interactive Patient Education [employer]

## 2016-07-25 LAB — PRENATAL PROFILE (SOLSTAS)
Antibody Screen: NEGATIVE
Basophils Absolute: 0 cells/uL (ref 0–200)
Basophils Relative: 0 %
Eosinophils Absolute: 62 cells/uL (ref 15–500)
Eosinophils Relative: 1 %
HCT: 35.4 % (ref 35.0–45.0)
HIV 1&2 Ab, 4th Generation: NONREACTIVE
Hemoglobin: 11.8 g/dL (ref 11.7–15.5)
Hepatitis B Surface Ag: NEGATIVE
Lymphocytes Relative: 33 %
Lymphs Abs: 2046 cells/uL (ref 850–3900)
MCH: 29.7 pg (ref 27.0–33.0)
MCHC: 33.3 g/dL (ref 32.0–36.0)
MCV: 89.2 fL (ref 80.0–100.0)
MPV: 9.4 fL (ref 7.5–12.5)
Monocytes Absolute: 372 cells/uL (ref 200–950)
Monocytes Relative: 6 %
Neutro Abs: 3720 cells/uL (ref 1500–7800)
Neutrophils Relative %: 60 %
Platelets: 289 10*3/uL (ref 140–400)
RBC: 3.97 MIL/uL (ref 3.80–5.10)
RDW: 13.9 % (ref 11.0–15.0)
Rh Type: POSITIVE
Rubella: 3.15 Index — ABNORMAL HIGH (ref <0.90)
WBC: 6.2 10*3/uL (ref 3.8–10.8)

## 2016-07-25 LAB — GC/CHLAMYDIA PROBE AMP (~~LOC~~) NOT AT ARMC
Chlamydia: NEGATIVE
Neisseria Gonorrhea: NEGATIVE

## 2016-07-25 LAB — CULTURE, OB URINE

## 2016-07-25 LAB — CYTOLOGY - PAP

## 2016-07-26 ENCOUNTER — Encounter (HOSPITAL_COMMUNITY): Payer: Self-pay

## 2016-07-26 ENCOUNTER — Ambulatory Visit (HOSPITAL_COMMUNITY)
Admission: RE | Admit: 2016-07-26 | Discharge: 2016-07-26 | Disposition: A | Discharge status: Home / Self Care | Payer: Medicaid Other | Source: Ambulatory Visit | Attending: Obstetrics & Gynecology | Admitting: Obstetrics & Gynecology

## 2016-07-26 DIAGNOSIS — Z3491 Encounter for supervision of normal pregnancy, unspecified, first trimester: Secondary | ICD-10-CM | POA: Diagnosis present

## 2016-07-26 DIAGNOSIS — Z3A12 12 weeks gestation of pregnancy: Secondary | ICD-10-CM | POA: Insufficient documentation

## 2016-07-27 LAB — HEMOGLOBINOPATHY EVALUATION
HCT: 35.4 % (ref 35.0–45.0)
Hemoglobin: 11.8 g/dL (ref 11.7–15.5)
Hgb A2 Quant: 2.4 % (ref 1.8–3.5)
Hgb A: 96.6 % (ref >96.0)
Hgb F Quant: <1 % (ref <2.0)
MCH: 29.7 pg (ref 27.0–33.0)
MCV: 89.2 fL (ref 80.0–100.0)
RBC: 3.97 MIL/uL (ref 3.80–5.10)
RDW: 13.9 % (ref 11.0–15.0)

## 2016-07-28 ENCOUNTER — Other Ambulatory Visit: Payer: Medicaid Other

## 2016-07-30 ENCOUNTER — Encounter: Payer: Self-pay | Admitting: Obstetrics & Gynecology

## 2016-07-30 DIAGNOSIS — F1290 Cannabis use, unspecified, uncomplicated: Secondary | ICD-10-CM | POA: Insufficient documentation

## 2016-07-30 LAB — PAIN MGMT, PROFILE 6 CONF W/O MM, U
6 Acetylmorphine: NEGATIVE ng/mL (ref <10)
Alcohol Metabolites: NEGATIVE ng/mL (ref <500)
Amphetamines: NEGATIVE ng/mL (ref <500)
Barbiturates: NEGATIVE ng/mL (ref <300)
Benzodiazepines: NEGATIVE ng/mL (ref <100)
Cocaine Metabolite: NEGATIVE ng/mL (ref <150)
Creatinine: 171.9 mg/dL (ref >=20.0)
Marijuana Metabolite: 475 ng/mL — ABNORMAL HIGH (ref <5)
Marijuana Metabolite: POSITIVE ng/mL — AB (ref <20)
Methadone Metabolite: NEGATIVE ng/mL (ref <100)
Opiates: NEGATIVE ng/mL (ref <100)
Oxidant: NEGATIVE ug/mL (ref <200)
Oxycodone: NEGATIVE ng/mL (ref <100)
Phencyclidine: NEGATIVE ng/mL (ref <25)
Please note:: 0
pH: 6.72 (ref 4.5–9.0)

## 2016-07-31 ENCOUNTER — Ambulatory Visit
Admission: RE | Admit: 2016-07-31 | Discharge: 2016-07-31 | Disposition: A | Discharge status: Home / Self Care | Payer: Medicaid Other | Source: Ambulatory Visit | Attending: Obstetrics & Gynecology | Admitting: Obstetrics & Gynecology

## 2016-07-31 DIAGNOSIS — N6312 Unspecified lump in the right breast, upper inner quadrant: Secondary | ICD-10-CM

## 2016-08-03 ENCOUNTER — Other Ambulatory Visit (HOSPITAL_COMMUNITY): Payer: Self-pay

## 2016-08-28 ENCOUNTER — Ambulatory Visit (INDEPENDENT_AMBULATORY_CARE_PROVIDER_SITE_OTHER): Payer: Medicaid Other | Admitting: Advanced Practice Midwife

## 2016-08-28 VITALS — BP 104/61 | HR 85 | Wt 134.0 lb

## 2016-08-28 DIAGNOSIS — O34219 Maternal care for unspecified type scar from previous cesarean delivery: Secondary | ICD-10-CM

## 2016-08-28 DIAGNOSIS — Z3492 Encounter for supervision of normal pregnancy, unspecified, second trimester: Secondary | ICD-10-CM

## 2016-08-28 DIAGNOSIS — R12 Heartburn: Secondary | ICD-10-CM

## 2016-08-28 DIAGNOSIS — O26892 Other specified pregnancy related conditions, second trimester: Secondary | ICD-10-CM

## 2016-08-28 LAB — POCT URINALYSIS DIP (DEVICE)
Bilirubin Urine: NEGATIVE
Glucose, UA: NEGATIVE mg/dL
Ketones, ur: NEGATIVE mg/dL
Nitrite: NEGATIVE
Protein, ur: NEGATIVE mg/dL
Specific Gravity, Urine: 1.01 (ref 1.005–1.030)
Urobilinogen, UA: 0.2 mg/dL (ref 0.0–1.0)
pH: 6 (ref 5.0–8.0)

## 2016-08-28 MED ORDER — RANITIDINE HCL 150 MG PO TABS: 150.0000 mg | ORAL_TABLET | Freq: Two times a day (BID) | ORAL | 2 refills | Status: DC | PRN

## 2016-08-28 MED ORDER — PRENATAL VITAMINS 28-0.8 MG PO TABS: 1.0000 | ORAL_TABLET | Freq: Every day | ORAL | 5 refills | Status: DC

## 2016-08-28 MED ORDER — PANTOPRAZOLE SODIUM 40 MG PO TBEC: 40.0000 mg | DELAYED_RELEASE_TABLET | Freq: Every day | ORAL | 5 refills | Status: DC

## 2016-08-28 NOTE — Progress Notes (Signed)
   PRENATAL VISIT NOTE  Subjective:  Leah Walls is a 30 y.o. [phone] at 5564w2d being seen today for ongoing prenatal care.  She is currently monitored for the following issues for this low-risk pregnancy and has Supervision of normal pregnancy; Short interval between pregnancies affecting pregnancy, antepartum; Previous cesarean delivery, antepartum; Grand multiparity, antepartum; Breast lump on right side at 1 o'clock position; and Marijuana use on her problem list.  Patient reports daily heartburn and scratchy throat with no other respiratory symptoms.  Contractions: Not present. Vag. Bleeding: None.  Movement: Absent. Denies leaking of fluid.   The following portions of the patient's history were reviewed and updated as appropriate: allergies, current medications, past family history, past medical history, past social history, past surgical history and problem list. Problem list updated.  Objective:   Vitals:   08/28/16 1422  BP: 104/61  Pulse: 85  Weight: 134 lb (60.8 kg)    Fetal Status: Fetal Heart Rate (bpm): 155   Movement: Absent     General:  Alert, oriented and cooperative. Patient is in no acute distress.  Skin: Skin is warm and dry. No rash noted.   Cardiovascular: Normal heart rate noted  Respiratory: Normal respiratory effort, no problems with respiration noted  Abdomen: Soft, gravid, appropriate for gestational age. Pain/Pressure: Absent     Pelvic:  Cervical exam deferred        Extremities: Normal range of motion.  Edema: None  Mental Status: Normal mood and affect. Normal behavior. Normal judgment and thought content.   Urinalysis: Urine Protein: Negative Urine Glucose: Negative  Assessment and Plan:  Pregnancy: [id number] at 5264w2d 1. Normal pregnancy in second trimester  - POCT urinalysis dip (device) - Flu Vaccine QUAD 36+ mos IM (Fluarix, Quad PF) - Prenatal Vit-Fe Fumarate-FA (PRENATAL VITAMINS) 28-0.8 MG TABS; Take 1 tablet by mouth daily.  Dispense: 30  tablet; Refill: 5  2. Previous cesarean delivery, antepartum --Hx C/S x 1 followed by 4 vaginal deliveries.  Pt given VBAC consent form to take home.  3. Heartburn during pregnancy, second trimester --Scratchy throat in absence of respiratory symptoms and with significant daily heartburn. Will treat for acid reflux. Pt taking Prilosec 2-3x /week.  D/C Prilosec, take Protonix 40 mg daily, then occasional Zantac 150 mg PO as needed. - pantoprazole (PROTONIX) 40 MG tablet; Take 1 tablet (40 mg total) by mouth daily.  Dispense: 30 tablet; Refill: 5 - ranitidine (ZANTAC) 150 MG tablet; Take 1 tablet (150 mg total) by mouth 2 (two) times daily as needed for heartburn.  Dispense: 60 tablet; Refill: 2  Preterm labor symptoms and general obstetric precautions including but not limited to vaginal bleeding, contractions, leaking of fluid and fetal movement were reviewed in detail with the patient. Please refer to After Visit Summary for other counseling recommendations.  Return in about 4 weeks (around 09/25/2016).  Hurshel PartyLisa A Leftwich-Kirby, CNM

## 2016-08-28 NOTE — Patient Instructions (Signed)
Second Trimester of Pregnancy The second trimester is from week 13 through week 28, months 4 through 6. The second trimester is often a time when you feel your best. Your body has also adjusted to being pregnant, and you begin to feel better physically. Usually, morning sickness has lessened or quit completely, you may have more energy, and you may have an increase in appetite. The second trimester is also a time when the fetus is growing rapidly. At the end of the sixth month, the fetus is about 9 inches long and weighs about 1 pounds. You will likely begin to feel the baby move (quickening) between 18 and 20 weeks of the pregnancy. BODY CHANGES Your body goes through many changes during pregnancy. The changes vary from woman to woman.   Your weight will continue to increase. You will notice your lower abdomen bulging out.  You may begin to get stretch marks on your hips, abdomen, and breasts.  You may develop headaches that can be relieved by medicines approved by your health care provider.  You may urinate more often because the fetus is pressing on your bladder.  You may develop or continue to have heartburn as a result of your pregnancy.  You may develop constipation because certain hormones are causing the muscles that push waste through your intestines to slow down.  You may develop hemorrhoids or swollen, bulging veins (varicose veins).  You may have back pain because of the weight gain and pregnancy hormones relaxing your joints between the bones in your pelvis and as a result of a shift in weight and the muscles that support your balance.  Your breasts will continue to grow and be tender.  Your gums may bleed and may be sensitive to brushing and flossing.  Dark spots or blotches (chloasma, mask of pregnancy) may develop on your face. This will likely fade after the baby is born.  A dark line from your belly button to the pubic area (linea nigra) may appear. This will likely fade  after the baby is born.  You may have changes in your hair. These can include thickening of your hair, rapid growth, and changes in texture. Some women also have hair loss during or after pregnancy, or hair that feels dry or thin. Your hair will most likely return to normal after your baby is born. WHAT TO EXPECT AT YOUR PRENATAL VISITS During a routine prenatal visit:  You will be weighed to make sure you and the fetus are growing normally.  Your blood pressure will be taken.  Your abdomen will be measured to track your baby's growth.  The fetal heartbeat will be listened to.  Any test results from the previous visit will be discussed. Your health care provider may ask you:  How you are feeling.  If you are feeling the baby move.  If you have had any abnormal symptoms, such as leaking fluid, bleeding, severe headaches, or abdominal cramping.  If you are using any tobacco products, including cigarettes, chewing tobacco, and electronic cigarettes.  If you have any questions. Other tests that may be performed during your second trimester include:  Blood tests that check for:  Low iron levels (anemia).  Gestational diabetes (between 24 and 28 weeks).  Rh antibodies.  Urine tests to check for infections, diabetes, or protein in the urine.  An ultrasound to confirm the proper growth and development of the baby.  An amniocentesis to check for possible genetic problems.  Fetal screens for spina bifida   and Down syndrome.  HIV (human immunodeficiency virus) testing. Routine prenatal testing includes screening for HIV, unless you choose not to have this test. HOME CARE INSTRUCTIONS   Avoid all smoking, herbs, alcohol, and unprescribed drugs. These chemicals affect the formation and growth of the baby.  Do not use any tobacco products, including cigarettes, chewing tobacco, and electronic cigarettes. If you need help quitting, ask your health care provider. You may receive  counseling support and other resources to help you quit.  Follow your health care provider's instructions regarding medicine use. There are medicines that are either safe or unsafe to take during pregnancy.  Exercise only as directed by your health care provider. Experiencing uterine cramps is a good sign to stop exercising.  Continue to eat regular, healthy meals.  Wear a good support bra for breast tenderness.  Do not use hot tubs, steam rooms, or saunas.  Wear your seat belt at all times when driving.  Avoid raw meat, uncooked cheese, cat litter boxes, and soil used by cats. These carry germs that can cause birth defects in the baby.  Take your prenatal vitamins.  Take 1500-2000 mg of calcium daily starting at the 20th week of pregnancy until you deliver your baby.  Try taking a stool softener (if your health care provider approves) if you develop constipation. Eat more high-fiber foods, such as fresh vegetables or fruit and whole grains. Drink plenty of fluids to keep your urine clear or pale yellow.  Take warm sitz baths to soothe any pain or discomfort caused by hemorrhoids. Use hemorrhoid cream if your health care provider approves.  If you develop varicose veins, wear support hose. Elevate your feet for 15 minutes, 3-4 times a day. Limit salt in your diet.  Avoid heavy lifting, wear low heel shoes, and practice good posture.  Rest with your legs elevated if you have leg cramps or low back pain.  Visit your dentist if you have not gone yet during your pregnancy. Use a soft toothbrush to brush your teeth and be gentle when you floss.  A sexual relationship may be continued unless your health care provider directs you otherwise.  Continue to go to all your prenatal visits as directed by your health care provider. SEEK MEDICAL CARE IF:   You have dizziness.  You have mild pelvic cramps, pelvic pressure, or nagging pain in the abdominal area.  You have persistent nausea,  vomiting, or diarrhea.  You have a bad smelling vaginal discharge.  You have pain with urination. SEEK IMMEDIATE MEDICAL CARE IF:   You have a fever.  You are leaking fluid from your vagina.  You have spotting or bleeding from your vagina.  You have severe abdominal cramping or pain.  You have rapid weight gain or loss.  You have shortness of breath with chest pain.  You notice sudden or extreme swelling of your face, hands, ankles, feet, or legs.  You have not felt your baby move in over an hour.  You have severe headaches that do not go away with medicine.  You have vision changes.   This information is not intended to replace advice given to you by your health care provider. Make sure you discuss any questions you have with your health care provider.   Document Released: 11/28/2001 Document Revised: 12/25/2014 Document Reviewed: 02/04/2013 Elsevier Interactive Patient Education 2016 [employer]  Vaginal Birth After Cesarean Delivery Vaginal birth after cesarean delivery (VBAC) is giving birth vaginally after previously delivering a baby by  a cesarean. In the past, if a woman had a cesarean delivery, all births afterward would be done by cesarean delivery. This is no longer true. It can be safe for the mother to try a vaginal delivery after having a cesarean delivery.  It is important to discuss VBAC with your health care provider early in the pregnancy so you can understand the risks, benefits, and options. It will give you time to decide what is best in your particular case. The final decision about whether to have a VBAC or repeat cesarean delivery should be between you and your health care provider. Any changes in your health or your baby's health during your pregnancy may make it necessary to change your initial decision about VBAC.  WOMEN WHO PLAN TO HAVE A VBAC SHOULD CHECK WITH THEIR HEALTH CARE PROVIDER TO BE SURE THAT:  The previous cesarean delivery was done with  a low transverse uterine cut (incision) (not a vertical classical incision).   The birth canal is big enough for the baby.   There were no other operations on the uterus.   An electronic fetal monitor (EFM) will be on at all times during labor.   An operating room will be available and ready in case an emergency cesarean delivery is needed.   A health care provider and surgical nursing staff will be available at all times during labor to be ready to do an emergency delivery cesarean if necessary.   An anesthesiologist will be present in case an emergency cesarean delivery is needed.   The nursery is prepared and has adequate personnel and necessary equipment available to care for the baby in case of an emergency cesarean delivery. BENEFITS OF VBAC  Shorter stay in the hospital.   Avoidance of risks associated with cesarean delivery, such as:  Surgical complications, such as opening of the incision or hernia in the incision.  Injury to other organs.  Fever. This can occur if an infection develops after surgery. It can also occur as a reaction to the medicine given to make you numb during the surgery.  Less blood loss and need for blood transfusions.  Lower risk of blood clots and infection.  Shorter recovery.   Decreased risk for having to remove the uterus (hysterectomy).   Decreased risk for the placenta to completely or partially cover the opening of the uterus (placenta previa) with a future pregnancy.   Decrease risk in future labor and delivery. RISKS OF A VBAC  Tearing (rupture) of the uterus. This is occurs in less than 1% of VBACs. The risk of this happening is higher if:  Steps are taken to begin the labor process (induce labor) or stimulate or strengthen contractions (augment labor).   Medicine is used to soften (ripen) the cervix.  Having to remove the uterus (hysterectomy) if it ruptures. VBAC SHOULD NOT BE DONE IF:  The previous cesarean  delivery was done with a vertical (classical) or T-shaped incision or you do not know what kind of incision was made.   You had a ruptured uterus.   You have had certain types of surgery on your uterus, such as removal of uterine fibroids. Ask your health care provider about other types of surgeries that prevent you from having a VBAC.  You have certain medical or childbirth (obstetrical) problems.   There are problems with the baby.   You have had two previous cesarean deliveries and no vaginal deliveries. OTHER FACTS TO KNOW ABOUT VBAC:  It is safe  to have an epidural anesthetic with VBAC.   It is safe to turn the baby from a breech position (attempt an external cephalic version).   It is safe to try a VBAC with twins.   VBAC may not be successful if your baby weights 8.8 lb (4 kg) or more. However, weight predictions are not always accurate and should not be used alone to decide if VBAC is right for you.  There is an increased failure rate if the time between the cesarean delivery and VBAC is less than 19 months.   Your health care provider may advise against a VBAC if you have preeclampsia (high blood pressure, protein in the urine, and swelling of face and extremities).   VBAC is often successful if you previously gave birth vaginally.   VBAC is often successful when the labor starts spontaneously before the due date.   Delivering a baby through a VBAC is similar to having a normal spontaneous vaginal delivery.   This information is not intended to replace advice given to you by your health care provider. Make sure you discuss any questions you have with your health care provider.   Document Released: 05/27/2007 Document Revised: 12/25/2014 Document Reviewed: 07/03/2013 Elsevier Interactive Patient Education [employer]

## 2016-09-18 ENCOUNTER — Ambulatory Visit (HOSPITAL_COMMUNITY): Payer: Medicaid Other

## 2016-09-22 ENCOUNTER — Ambulatory Visit (HOSPITAL_COMMUNITY)
Admission: RE | Admit: 2016-09-22 | Discharge: 2016-09-22 | Disposition: A | Discharge status: Home / Self Care | Payer: Medicaid Other | Source: Ambulatory Visit | Attending: Obstetrics & Gynecology | Admitting: Obstetrics & Gynecology

## 2016-09-22 DIAGNOSIS — O09292 Supervision of pregnancy with other poor reproductive or obstetric history, second trimester: Secondary | ICD-10-CM | POA: Insufficient documentation

## 2016-09-22 DIAGNOSIS — O34219 Maternal care for unspecified type scar from previous cesarean delivery: Secondary | ICD-10-CM | POA: Insufficient documentation

## 2016-09-22 DIAGNOSIS — Z363 Encounter for antenatal screening for malformations: Secondary | ICD-10-CM | POA: Insufficient documentation

## 2016-09-22 DIAGNOSIS — O0942 Supervision of pregnancy with grand multiparity, second trimester: Secondary | ICD-10-CM | POA: Insufficient documentation

## 2016-09-22 DIAGNOSIS — Z3A20 20 weeks gestation of pregnancy: Secondary | ICD-10-CM | POA: Insufficient documentation

## 2016-09-22 DIAGNOSIS — Z3491 Encounter for supervision of normal pregnancy, unspecified, first trimester: Secondary | ICD-10-CM

## 2016-09-26 ENCOUNTER — Encounter: Payer: Medicaid Other | Admitting: Family

## 2016-10-15 ENCOUNTER — Encounter (HOSPITAL_COMMUNITY): Payer: Self-pay | Admitting: Certified Nurse Midwife

## 2016-10-15 ENCOUNTER — Inpatient Hospital Stay (HOSPITAL_COMMUNITY)
Admission: AD | Admit: 2016-10-15 | Discharge: 2016-10-15 | Disposition: A | Discharge status: Home / Self Care | Payer: Medicaid Other | Source: Ambulatory Visit | Attending: Obstetrics & Gynecology | Admitting: Obstetrics & Gynecology

## 2016-10-15 DIAGNOSIS — Z3482 Encounter for supervision of other normal pregnancy, second trimester: Secondary | ICD-10-CM

## 2016-10-15 DIAGNOSIS — O09899 Supervision of other high risk pregnancies, unspecified trimester: Secondary | ICD-10-CM

## 2016-10-15 DIAGNOSIS — O0942 Supervision of pregnancy with grand multiparity, second trimester: Secondary | ICD-10-CM | POA: Diagnosis not present

## 2016-10-15 DIAGNOSIS — O98312 Other infections with a predominantly sexual mode of transmission complicating pregnancy, second trimester: Secondary | ICD-10-CM | POA: Diagnosis not present

## 2016-10-15 DIAGNOSIS — Z8249 Family history of ischemic heart disease and other diseases of the circulatory system: Secondary | ICD-10-CM | POA: Diagnosis not present

## 2016-10-15 DIAGNOSIS — O34219 Maternal care for unspecified type scar from previous cesarean delivery: Secondary | ICD-10-CM | POA: Insufficient documentation

## 2016-10-15 DIAGNOSIS — A609 Anogenital herpesviral infection, unspecified: Secondary | ICD-10-CM

## 2016-10-15 DIAGNOSIS — O0940 Supervision of pregnancy with grand multiparity, unspecified trimester: Secondary | ICD-10-CM

## 2016-10-15 DIAGNOSIS — Z3A24 24 weeks gestation of pregnancy: Secondary | ICD-10-CM

## 2016-10-15 DIAGNOSIS — K649 Unspecified hemorrhoids: Secondary | ICD-10-CM | POA: Diagnosis present

## 2016-10-15 DIAGNOSIS — A601 Herpesviral infection of perianal skin and rectum: Secondary | ICD-10-CM | POA: Diagnosis not present

## 2016-10-15 DIAGNOSIS — Z87891 Personal history of nicotine dependence: Secondary | ICD-10-CM | POA: Diagnosis not present

## 2016-10-15 DIAGNOSIS — O2242 Hemorrhoids in pregnancy, second trimester: Secondary | ICD-10-CM | POA: Diagnosis not present

## 2016-10-15 MED ORDER — VALACYCLOVIR HCL 500 MG PO TABS
1000.0000 mg | ORAL_TABLET | Freq: Once | ORAL | Status: AC
  Administered 2016-10-15: 1000 mg via ORAL
  Filled 2016-10-15: qty 2

## 2016-10-15 MED ORDER — VALACYCLOVIR HCL 1 G PO TABS: 1000.0000 mg | ORAL_TABLET | Freq: Two times a day (BID) | ORAL | 11 refills | Status: AC

## 2016-10-15 MED ORDER — BUTALBITAL-APAP-CAFFEINE 50-325-40 MG PO TABS: 1.0000 | ORAL_TABLET | Freq: Four times a day (QID) | ORAL | 0 refills | Status: DC | PRN

## 2016-10-15 NOTE — MAU Provider Note (Signed)
History   [id number] @ 24.1 wks in with c/o hemorrhoids. States started having burning and itching and pain 4 days ago. CSN: [id number]  Arrival date & time 10/15/16  [id number]   First Provider Initiated Contact with Patient 10/15/16 2011      Chief Complaint  Patient presents with  . Hemorrhoids    HPI  Past Medical History:  Diagnosis Date  . Headache     Past Surgical History:  Procedure Laterality Date  . CESAREAN SECTION    . DILATION AND EVACUATION Bilateral 07/14/2015   Procedure: DILATATION AND EVACUATION;  Surgeon: Kathreen CosierBernard A Marshall, MD;  Location: [facility];  Service: Gynecology;  Laterality: Bilateral;  . FOREARM FRACTURE SURGERY Right 2008  . WISDOM TOOTH EXTRACTION      Family History  Problem Relation Age of Onset  . Hypertension Mother   . Hypertension Maternal Grandmother   . Hypertension Paternal Grandmother     Social History  Substance Use Topics  . Smoking status: Former Smoker    Types: Cigarettes  . Smokeless tobacco: Never Used  . Alcohol use No    OB History    Gravida Para Term Preterm AB Living   6 5 4 1   5    SAB TAB Ectopic Multiple Live Births         0 4      Review of Systems  Constitutional: Negative.   HENT: Negative.   Eyes: Negative.   Respiratory: Negative.   Cardiovascular: Negative.   Gastrointestinal: Positive for rectal pain.  Endocrine: Negative.   Genitourinary: Negative.   Musculoskeletal: Negative.   Skin: Negative.   Allergic/Immunologic: Negative.   Neurological: Negative.   Hematological: Negative.   Psychiatric/Behavioral: Negative.     Allergies  Review of patient's allergies indicates no known allergies.  Home Medications   Current Outpatient Rx  . Order #: [id number] Class: Normal  . Order #: [id number] Class: Normal    BP 104/64 (BP Location: Right Arm)   Pulse 103   Temp 98 F (36.7 C) (Oral)   Resp 18   LMP 04/29/2016   Physical Exam  MAU Course  Procedures (including critical care  time)  Labs Reviewed  HSV CULTURE AND TYPING   No results found.   1. HSV (herpes simplex virus) anogenital infection   2. Encounter for supervision of other normal pregnancy in second trimester   3. Short interval between pregnancies affecting pregnancy, antepartum   4. Previous cesarean delivery, antepartum   5. Grand multiparity, antepartum       MDM  HSV outbreak  Anal area has multiple ulcerative lesions around rectum. Very pain to pt to examine. HSV culture obtained. Discussed findings with pt and importance of taking her meds throughout the pregnancy. Will discharge home on valtrex and pain management

## 2016-10-15 NOTE — Discharge Instructions (Signed)

## 2016-10-15 NOTE — MAU Note (Addendum)
Pt presents complaining of pain in her rectum that has been going on for 3 days. Thinks it is hemorrhoids. Tried cream and wipes but says it still hurts and itches. Spotting when she wipes her bottom. Reports good fetal movement. Denies leaking or vaginal bleeding. Denies contractions.

## 2016-10-17 LAB — HSV CULTURE AND TYPING

## 2016-10-18 ENCOUNTER — Ambulatory Visit (INDEPENDENT_AMBULATORY_CARE_PROVIDER_SITE_OTHER): Payer: Medicaid Other | Admitting: Advanced Practice Midwife

## 2016-10-18 VITALS — BP 106/61 | HR 109 | Wt 138.1 lb

## 2016-10-18 DIAGNOSIS — A6009 Herpesviral infection of other urogenital tract: Secondary | ICD-10-CM

## 2016-10-18 DIAGNOSIS — A6000 Herpesviral infection of urogenital system, unspecified: Secondary | ICD-10-CM | POA: Insufficient documentation

## 2016-10-18 DIAGNOSIS — O98319 Other infections with a predominantly sexual mode of transmission complicating pregnancy, unspecified trimester: Secondary | ICD-10-CM

## 2016-10-18 DIAGNOSIS — Z3482 Encounter for supervision of other normal pregnancy, second trimester: Secondary | ICD-10-CM

## 2016-10-18 DIAGNOSIS — O98312 Other infections with a predominantly sexual mode of transmission complicating pregnancy, second trimester: Secondary | ICD-10-CM

## 2016-10-18 DIAGNOSIS — A609 Anogenital herpesviral infection, unspecified: Secondary | ICD-10-CM

## 2016-10-18 MED ORDER — PRENATE PIXIE 10-0.6-0.4-200 MG PO CAPS: 1.0000 | ORAL_CAPSULE | Freq: Every day | ORAL | 12 refills | Status: DC

## 2016-10-18 NOTE — Patient Instructions (Addendum)
Genital Herpes Genital herpes is a common sexually transmitted infection (STI) that is caused by a virus. The virus is spread from person to person through sexual contact. Infection can cause itching, blisters, and sores in the genital area or rectal area. This is called an outbreak. It affects both men and women. Genital herpes is particularly concerning for pregnant women because the virus can be passed to the baby during delivery and cause serious problems. Genital herpes is also a concern for people with a weakened defense (immune) system. Symptoms of genital herpes may last several days and then go away. However, the virus remains in your body, so you may have more outbreaks of symptoms in the future. The time between outbreaks varies and can be months or years. CAUSES Genital herpes is caused by a virus called herpes simplex virus (HSV) type 2 or HSV type 1. These viruses are contagious and are most often spread through sexual contact with an infected person. Sexual contact includes vaginal, anal, and oral sex. RISK FACTORS  Risk factors for genital herpes include:  Being sexually active with multiple partners.  Having unprotected sex. SIGNS AND SYMPTOMS Symptoms may include:  Pain and itching in the genital area or rectal area.  Small red bumps that turn into blisters and then turn into sores.  Flu-like symptoms, including:  Fever.  Body aches.  Painful urination.  Vaginal discharge. DIAGNOSIS Genital herpes may be diagnosed by:  Physical exam.  Blood test.  Fluid culture test from an open sore. TREATMENT There is no cure for genital herpes. Oral antiviral medicines may be used to speed up healing and to help prevent the return of symptoms. These medicines can also help to reduce the spread of the virus to sexual partners. HOME CARE INSTRUCTIONS  Keep the affected areas dry and clean.  Take medicines only as directed by your health care provider.  Do not have sexual  contact during active infections. Genital herpes is contagious.  Practice safe sex. Latex condoms and female condoms may help to prevent the spread of the herpes virus.  Avoid rubbing or touching the blisters and sores. If you do touch the blister or sores:  Wash your hands thoroughly.  Do not touch your eyes afterward.  If you become pregnant, tell your health care provider if you have had genital herpes.  Keep all follow-up visits as directed by your health care provider. This is important. PREVENTION  Use condoms. Although anyone can contract genital herpes during sexual contact even with the use of a condom, a condom can provide some protection.  Avoid having multiple sexual partners.  Talk to your sexual partner about any symptoms and past history that either of you may have.  Get tested before you have sex. Ask your partner to do the same.  Recognize the symptoms of genital herpes. Do not have sexual contact if you notice these symptoms. SEEK MEDICAL CARE IF:  Your symptoms are not improving with medicine.  Your symptoms return.  You have new symptoms.  You have a fever.  You have abdominal pain.  You have redness, swelling, or pain in your eye. MAKE SURE YOU:  Understand these instructions.  Will watch your condition.  Will get help right away if you are not doing well or get worse.   This information is not intended to replace advice given to you by your health care provider. Make sure you discuss any questions you have with your health care provider.   Document Released:  12/01/2000 Document Revised: 12/25/2014 Document Reviewed: 04/21/2014 Elsevier Interactive Patient Education 2016 [employer]  Herpes During and After Pregnancy Genital herpes is a sexually transmitted infection (STI). It is caused by a virus and can be very serious during pregnancy. The greatest concern is passing the virus to the fetus and newborn. The virus is more likely to be passed  to the newborn during delivery if the mother becomes infected for the first time late in her pregnancy (primary infection). It is less common for the virus to pass to the newborn if the mother had herpes before becoming pregnant. This is because antibodies against the virus develop over a period of time. These antibodies help protect the baby. Lastly, the infection can pass to the fetus through the placenta. This can happen if the mother gets herpes for the first time in the first 3 months of her pregnancy (first trimester). This can possibly cause a miscarriage or birth defects in the baby. TREATMENT DURING PREGNANCY Medicines may be prescribed that are safe for the mother and the fetus. The medicine can lessen symptoms or prevent a recurrence of the infection. If the infection happened before becoming pregnant, medicine may be prescribed in the last 4 weeks of the pregnancy. This can prevent a recurrent infection at the time of delivery.  RECOMMENDED DELIVERY METHOD If an active, recurrent infection is present at the time delivery, the baby should be delivered by cesarean delivery. This is because the virus can pass to the baby through an infected birth canal. This can cause severe problems for the baby. If the infection happens for the first time late in the pregnancy, the caregiver may also recommend a cesarean delivery. With a new infection, the body has not had the time to build up enough antibodies against the virus to protect the baby from getting the infection. Even if the birth canal does not have visible sores (lesions) from the herpes virus, there is still that chance that the virus can spread to the baby. A cesarean delivery should be done if there is any signs or feeling of an infection being present in the genital area. Cesarean delivery is not recommended for women with a history of herpes infection but no evidence of active genital lesions at the time of delivery. Lesions that have crusted  fully are considered healed and not active. BREASTFEEDING  Women infected with genital herpes can breastfeed their baby. The virus will not be present in the breast milk. If lesions are present on the breast, the baby should not breastfeed from the affected breast(s). HOW TO PREVENT PASSING HERPES TO YOUR BABY AFTER DELIVERY  Wash your hands with soap and water often and before touching your baby.  If you have an outbreak, keep the area clean and covered.  Try to avoid physical and stressful situations that may bring on an outbreak. SEEK IMMEDIATE MEDICAL CARE IF:   You have an outbreak during pregnancy and cannot urinate.  You have an outbreak anytime during your pregnancy and especially in the last 3 months of the pregnancy.  You think you are having an allergic reaction or side effects from the medicine you are taking.   This information is not intended to replace advice given to you by your health care provider. Make sure you discuss any questions you have with your health care provider.   Document Released: 03/12/2001 Document Revised: 12/25/2014 Document Reviewed: 11/14/2011 Elsevier Interactive Patient Education [employer] Inc[employer]

## 2016-10-23 ENCOUNTER — Encounter: Payer: Self-pay | Admitting: Obstetrics

## 2016-10-23 LAB — PROCEDURE REPORT - SCANNED: Pap: NEGATIVE

## 2016-10-25 NOTE — Progress Notes (Signed)
   PRENATAL VISIT NOTE  Subjective:  Leah Walls is a 30 y.o. [phone] at 1962w4d being seen today for ongoing prenatal care.  She is currently monitored for the following issues for this low-risk pregnancy and has Supervision of normal pregnancy; Short interval between pregnancies affecting pregnancy, antepartum; Previous cesarean delivery, antepartum; Grand multiparity, antepartum; Breast lump on right side at 1 o'clock position; Marijuana use; and Genital herpes affecting pregnancy, antepartum on her problem list.  Patient reports Herpes lesions improved significantly since starting valtrex..  Contractions: Not present. Vag. Bleeding: None.  Movement: Present. Denies leaking of fluid.   The following portions of the patient's history were reviewed and updated as appropriate: allergies, current medications, past family history, past medical history, past social history, past surgical history and problem list. Problem list updated.  Objective:   Vitals:   10/18/16 1115  BP: 106/61  Pulse: (!) 109  Weight: 138 lb 1.6 oz (62.6 kg)    Fetal Status: Fetal Heart Rate (bpm): 166   Movement: Present     General:  Alert, oriented and cooperative. Patient is in no acute distress.  Skin: Skin is warm and dry. No rash noted.   Cardiovascular: Normal heart rate noted  Respiratory: Normal respiratory effort, no problems with respiration noted  Abdomen: Soft, gravid, appropriate for gestational age. Pain/Pressure: Absent     Pelvic:  Cervical exam deferred        Extremities: Normal range of motion.  Edema: None  Mental Status: Normal mood and affect. Normal behavior. Normal judgment and thought content.   Assessment and Plan:  Pregnancy: [id number] at [redacted]w[redacted]d  1. Encounter for supervision of other normal pregnancy in second trimester   2. Genital herpes affecting pregnancy, antepartum  - HSV(herpes smplx)abs-1+2(IgG+IgM)-bld  Preterm labor symptoms and general obstetric precautions including  but not limited to vaginal bleeding, contractions, leaking of fluid and fetal movement were reviewed in detail with the patient. Please refer to After Visit Summary for other counseling recommendations.  Stay on Valtrex daily or restart at 36 weeks.  Return in about 4 weeks (around 11/15/2016) for ROB/GTT.   Dorathy KinsmanVirginia Katieann Hungate, CNM

## 2016-11-15 ENCOUNTER — Ambulatory Visit (INDEPENDENT_AMBULATORY_CARE_PROVIDER_SITE_OTHER): Payer: Medicaid Other | Admitting: Obstetrics and Gynecology

## 2016-11-15 ENCOUNTER — Encounter: Payer: Self-pay | Admitting: Obstetrics and Gynecology

## 2016-11-15 VITALS — BP 94/54 | HR 83 | Wt 144.0 lb

## 2016-11-15 DIAGNOSIS — O98313 Other infections with a predominantly sexual mode of transmission complicating pregnancy, third trimester: Secondary | ICD-10-CM

## 2016-11-15 DIAGNOSIS — Z23 Encounter for immunization: Secondary | ICD-10-CM

## 2016-11-15 DIAGNOSIS — Z3483 Encounter for supervision of other normal pregnancy, third trimester: Secondary | ICD-10-CM | POA: Diagnosis present

## 2016-11-15 DIAGNOSIS — O34219 Maternal care for unspecified type scar from previous cesarean delivery: Secondary | ICD-10-CM

## 2016-11-15 DIAGNOSIS — A6009 Herpesviral infection of other urogenital tract: Secondary | ICD-10-CM

## 2016-11-15 DIAGNOSIS — O98319 Other infections with a predominantly sexual mode of transmission complicating pregnancy, unspecified trimester: Secondary | ICD-10-CM

## 2016-11-15 DIAGNOSIS — A609 Anogenital herpesviral infection, unspecified: Secondary | ICD-10-CM

## 2016-11-15 DIAGNOSIS — K439 Ventral hernia without obstruction or gangrene: Secondary | ICD-10-CM | POA: Diagnosis not present

## 2016-11-15 DIAGNOSIS — K429 Umbilical hernia without obstruction or gangrene: Secondary | ICD-10-CM | POA: Insufficient documentation

## 2016-11-15 LAB — CBC
HCT: 28.4 % — ABNORMAL LOW (ref 35.0–45.0)
Hemoglobin: 9.4 g/dL — ABNORMAL LOW (ref 11.7–15.5)
MCH: 29.1 pg (ref 27.0–33.0)
MCHC: 33.1 g/dL (ref 32.0–36.0)
MCV: 87.9 fL (ref 80.0–100.0)
MPV: 9.5 fL (ref 7.5–12.5)
Platelets: 243 10*3/uL (ref 140–400)
RBC: 3.23 MIL/uL — ABNORMAL LOW (ref 3.80–5.10)
RDW: 14.4 % (ref 11.0–15.0)
WBC: 8.2 10*3/uL (ref 3.8–10.8)

## 2016-11-15 MED ORDER — TETANUS-DIPHTH-ACELL PERTUSSIS 5-2.5-18.5 LF-MCG/0.5 IM SUSP
0.5000 mL | Freq: Once | INTRAMUSCULAR | Status: AC
  Administered 2016-11-15: 0.5 mL via INTRAMUSCULAR

## 2016-11-15 NOTE — Progress Notes (Signed)
   PRENATAL VISIT NOTE  Subjective:  Leah Walls is a 30 y.o. [phone] at 6656w4d being seen today for ongoing prenatal care.  She is currently monitored for the following issues for this low-risk pregnancy and has Supervision of normal pregnancy; Short interval between pregnancies affecting pregnancy, antepartum; Previous cesarean delivery, antepartum; Grand multiparity, antepartum; Breast lump on right side at 1 o'clock position; Marijuana use; and Genital herpes affecting pregnancy, antepartum on her problem list.  Patient reports pain at the umbilicus; history of abdominal hernia. She saw surgical team after one of her deliveries and never went back.   Contractions: Irregular. Vag. Bleeding: None.  Movement: Present. Denies leaking of fluid.   The following portions of the patient's history were reviewed and updated as appropriate: allergies, current medications, past family history, past medical history, past social history, past surgical history and problem list. Problem list updated.  Objective:   Vitals:   11/15/16 0919  BP: (!) 94/54  Pulse: 83  Weight: 144 lb (65.3 kg)    Fetal Status: Fetal Heart Rate (bpm): 155   Movement: Present     General:  Alert, oriented and cooperative. Patient is in no acute distress.  Skin: Skin is warm and dry. No rash noted.   Cardiovascular: Normal heart rate noted  Respiratory: Normal respiratory effort, no problems with respiration noted  Abdomen: Soft, gravid, appropriate for gestational age. Abdominal/umbilcal hernia easily reducible; minimal pain with palpation. .  Pain/Pressure: Present     Pelvic:  Cervical exam deferred        Extremities: Normal range of motion.  Edema: Trace  Mental Status: Normal mood and affect. Normal behavior. Normal judgment and thought content.   Assessment and Plan:  Pregnancy: [id number] at 2756w4d  1. Encounter for supervision of other normal pregnancy in third trimester  - Glucose Tolerance, 2 Hours w/1  Hour - HIV antibody (with reflex) - CBC - RPR - Tdap (BOOSTRIX) injection 0.5 mL; Inject 0.5 mLs into the muscle once.  2. Genital herpes affecting pregnancy, antepartum - No outbreak currently. Will plan for suppressive therapy at 36 weeks.   3. Previous cesarean delivery, antepartum --Hx C/S x 1 followed by 4 vaginal deliveries. Patient plans VBAC    4. Abdominal wall hernia - Discussed reasons to be seen; if hernia pain increases and/or hernia is not reducible  - May need surgical referral   Preterm labor symptoms and general obstetric precautions including but not limited to vaginal bleeding, contractions, leaking of fluid and fetal movement were reviewed in detail with the patient. Please refer to After Visit Summary for other counseling recommendations.  Return in about 4 weeks (around 12/13/2016).   Duane LopeJennifer I Quamir Willemsen, NP

## 2016-11-16 LAB — HIV ANTIBODY (ROUTINE TESTING W REFLEX): HIV 1&2 Ab, 4th Generation: NONREACTIVE

## 2016-11-16 LAB — GLUCOSE TOLERANCE, 2 HOURS W/ 1HR
Glucose, 1 hour: 113 mg/dL
Glucose, 2 hour: 83 mg/dL (ref <140)
Glucose, Fasting: 85 mg/dL (ref 65–99)

## 2016-11-16 LAB — RPR

## 2016-11-28 ENCOUNTER — Telehealth: Payer: Self-pay | Admitting: General Practice

## 2016-11-28 NOTE — Telephone Encounter (Signed)
Patient called and left message requesting glucose test results. Patient states a detailed message can be left. Called patient, no answer- left message stating we are trying to reach you to return your phone call from yesterday. Your glucose test was normal. If you have any questions you may call us back

## 2016-12-04 ENCOUNTER — Encounter: Payer: Self-pay | Admitting: Advanced Practice Midwife

## 2016-12-04 ENCOUNTER — Ambulatory Visit (INDEPENDENT_AMBULATORY_CARE_PROVIDER_SITE_OTHER): Payer: Medicaid Other | Admitting: Advanced Practice Midwife

## 2016-12-04 VITALS — BP 120/62 | HR 97 | Wt 145.0 lb

## 2016-12-04 DIAGNOSIS — Z3483 Encounter for supervision of other normal pregnancy, third trimester: Secondary | ICD-10-CM

## 2016-12-04 NOTE — Patient Instructions (Signed)
. °Preterm Labor and Birth Information °The normal length of a pregnancy is 39-41 weeks. Preterm labor is when labor starts before 37 completed weeks of pregnancy. °What are the risk factors for preterm labor? °Preterm labor is more likely to occur in women who: °· Have certain infections during pregnancy such as a bladder infection, sexually transmitted infection, or infection inside the uterus (chorioamnionitis). °· Have a shorter-than-normal cervix. °· Have gone into preterm labor before. °· Have had surgery on their cervix. °· Are younger than age 17 or older than age 35. °· Are African American. °· Are pregnant with twins or multiple babies (multiple gestation). °· Take street drugs or smoke while pregnant. °· Do not gain enough weight while pregnant. °· Became pregnant shortly after having been pregnant. °What are the symptoms of preterm labor? °Symptoms of preterm labor include: °· Cramps similar to those that can happen during a menstrual period. The cramps may happen with diarrhea. °· Pain in the abdomen or lower back. °· Regular uterine contractions that may feel like tightening of the abdomen. °· A feeling of increased pressure in the pelvis. °· Increased watery or bloody mucus discharge from the vagina. °· Water breaking (ruptured amniotic sac). °Why is it important to recognize signs of preterm labor? °It is important to recognize signs of preterm labor because babies who are born prematurely may not be fully developed. This can put them at an increased risk for: °· Long-term (chronic) heart and lung problems. °· Difficulty immediately after birth with regulating body systems, including blood sugar, body temperature, heart rate, and breathing rate. °· Bleeding in the brain. °· Cerebral palsy. °· Learning difficulties. °· Death. °These risks are highest for babies who are born before 34 weeks of pregnancy. °How is preterm labor treated? °Treatment depends on the length of your pregnancy, your condition,  and the health of your baby. It may involve: °· Having a stitch (suture) placed in your cervix to prevent your cervix from opening too early (cerclage). °· Taking or being given medicines, such as: °¨ Hormone medicines. These may be given early in pregnancy to help support the pregnancy. °¨ Medicine to stop contractions. °¨ Medicines to help mature the baby’s lungs. These may be prescribed if the risk of delivery is high. °¨ Medicines to prevent your baby from developing cerebral palsy. °If the labor happens before 34 weeks of pregnancy, you may need to stay in the hospital. °What should I do if I think I am in preterm labor? °If you think that you are going into preterm labor, call your health care provider right away. °How can I prevent preterm labor in future pregnancies? °To increase your chance of having a full-term pregnancy: °· Do not use any tobacco products, such as cigarettes, chewing tobacco, and e-cigarettes. If you need help quitting, ask your health care provider. °· Do not use street drugs or medicines that have not been prescribed to you during your pregnancy. °· Talk with your health care provider before taking any herbal supplements, even if you have been taking them regularly. °· Make sure you gain a healthy amount of weight during your pregnancy. °· Watch for infection. If you think that you might have an infection, get it checked right away. °· Make sure to tell your health care provider if you have gone into preterm labor before. °This information is not intended to replace advice given to you by your health care provider. Make sure you discuss any questions you have with your   health care provider. °Document Released: 02/24/2004 Document Revised: 05/16/2016 Document Reviewed: 04/26/2016 °Elsevier Interactive Patient Education © 2017 Elsevier Inc. ° °

## 2016-12-04 NOTE — Progress Notes (Signed)
   PRENATAL VISIT NOTE  Subjective:  Anders SimmondsSade I Segall is a 30 y.o. [phone] at 4273w2d being seen today for ongoing prenatal care.  She is currently monitored for the following issues for this high-risk pregnancy and has Supervision of normal pregnancy; Short interval between pregnancies affecting pregnancy, antepartum; Previous cesarean delivery, antepartum; Grand multiparity, antepartum; Breast lump on right side at 1 o'clock position; Marijuana use; Genital herpes affecting pregnancy, antepartum; and Abdominal wall hernia on her problem list.  Patient reports occasional contractions.  Contractions: Irregular.  .  Movement: Present. Denies leaking of fluid.   The following portions of the patient's history were reviewed and updated as appropriate: allergies, current medications, past family history, past medical history, past social history, past surgical history and problem list. Problem list updated.  Objective:   Vitals:   12/04/16 1520  BP: 120/62  Pulse: 97  Weight: 145 lb (65.8 kg)    Fetal Status: Fetal Heart Rate (bpm): 146   Movement: Present     General:  Alert, oriented and cooperative. Patient is in no acute distress.  Skin: Skin is warm and dry. No rash noted.   Cardiovascular: Normal heart rate noted  Respiratory: Normal respiratory effort, no problems with respiration noted  Abdomen: Soft, gravid, appropriate for gestational age. Pain/Pressure: Present     Pelvic:  Cervical exam deferred      Offered cervical exam, states UCs are not that frequent.  Extremities: Normal range of motion.  Edema: None  Mental Status: Normal mood and affect. Normal behavior. Normal judgment and thought content.   Assessment and Plan:  Pregnancy: [id number] at 10573w2d  1. Encounter for supervision of other normal pregnancy in third trimester     Reviewed signs of preterm labor  Preterm labor symptoms and general obstetric precautions including but not limited to vaginal bleeding, contractions,  leaking of fluid and fetal movement were reviewed in detail with the patient. Please refer to After Visit Summary for other counseling recommendations.  Return in 2 weeks (on 12/18/2016).   Aviva SignsMarie L Makaylen Thieme, CNM

## 2016-12-18 NOTE — L&D Delivery Note (Signed)
Delivery Note At 0928 on 02/06/2017, a viable female was delivered via  (Presentation: ROA  ).  APGAR: 9, 9; weight pending.   Placenta status: spontaneous, intact.  Cord: 3 vessel with the following complications: none.   Anesthesia: epidural Episiotomy: none Lacerations: none Suture Repair: N/A Est. Blood Loss (mL): 50   Mom to postpartum.  Baby to Couplet care / Skin to Skin.  Wendee Beaversavid J Khiyan Crace, DO, PGY-1 02/06/2017, 9:43 AM

## 2016-12-19 ENCOUNTER — Encounter: Payer: Medicaid Other | Admitting: Advanced Practice Midwife

## 2017-01-10 ENCOUNTER — Other Ambulatory Visit (HOSPITAL_COMMUNITY)
Admission: RE | Admit: 2017-01-10 | Discharge: 2017-01-10 | Disposition: A | Discharge status: Home / Self Care | Payer: Medicaid Other | Source: Ambulatory Visit | Attending: Obstetrics & Gynecology | Admitting: Obstetrics & Gynecology

## 2017-01-10 ENCOUNTER — Ambulatory Visit (INDEPENDENT_AMBULATORY_CARE_PROVIDER_SITE_OTHER): Payer: Medicaid Other | Admitting: Obstetrics & Gynecology

## 2017-01-10 VITALS — BP 108/60 | HR 94 | Wt 153.0 lb

## 2017-01-10 DIAGNOSIS — Z3A36 36 weeks gestation of pregnancy: Secondary | ICD-10-CM | POA: Diagnosis not present

## 2017-01-10 DIAGNOSIS — O26893 Other specified pregnancy related conditions, third trimester: Secondary | ICD-10-CM | POA: Insufficient documentation

## 2017-01-10 DIAGNOSIS — Z3403 Encounter for supervision of normal first pregnancy, third trimester: Secondary | ICD-10-CM | POA: Diagnosis not present

## 2017-01-10 DIAGNOSIS — L298 Other pruritus: Secondary | ICD-10-CM

## 2017-01-10 DIAGNOSIS — N898 Other specified noninflammatory disorders of vagina: Secondary | ICD-10-CM

## 2017-01-10 DIAGNOSIS — M549 Dorsalgia, unspecified: Secondary | ICD-10-CM

## 2017-01-10 DIAGNOSIS — L299 Pruritus, unspecified: Secondary | ICD-10-CM | POA: Insufficient documentation

## 2017-01-10 DIAGNOSIS — Z113 Encounter for screening for infections with a predominantly sexual mode of transmission: Secondary | ICD-10-CM | POA: Diagnosis not present

## 2017-01-10 DIAGNOSIS — O9989 Other specified diseases and conditions complicating pregnancy, childbirth and the puerperium: Secondary | ICD-10-CM

## 2017-01-10 DIAGNOSIS — O99891 Other specified diseases and conditions complicating pregnancy: Secondary | ICD-10-CM

## 2017-01-10 LAB — OB RESULTS CONSOLE GC/CHLAMYDIA: Gonorrhea: NEGATIVE

## 2017-01-10 LAB — OB RESULTS CONSOLE GBS: GBS: NEGATIVE

## 2017-01-10 MED ORDER — CYCLOBENZAPRINE HCL 10 MG PO TABS: 10.0000 mg | ORAL_TABLET | Freq: Three times a day (TID) | ORAL | 1 refills | Status: DC | PRN

## 2017-01-10 MED ORDER — TERCONAZOLE 0.4 % VA CREA: 1.0000 | TOPICAL_CREAM | Freq: Every day | VAGINAL | 0 refills | Status: DC

## 2017-01-10 NOTE — Progress Notes (Signed)
   PRENATAL VISIT NOTE  Subjective:  Leah Walls is a 31 y.o. [phone] at 4757w4d being seen today for ongoing prenatal care.  She is currently monitored for the following issues for this low-risk pregnancy and has Supervision of normal pregnancy; Short interval between pregnancies affecting pregnancy, antepartum; Previous cesarean delivery, antepartum; Grand multiparity, antepartum; Breast lump on right side at 1 o'clock position; Marijuana use; Genital herpes affecting pregnancy, antepartum; and Abdominal wall hernia on her problem list.  Patient reports backache, occasional contractions and vaginal irritation.  Contractions: Irregular. Vag. Bleeding: None.  Movement: Present. Denies leaking of fluid.   The following portions of the patient's history were reviewed and updated as appropriate: allergies, current medications, past family history, past medical history, past social history, past surgical history and problem list. Problem list updated.  Objective:   Vitals:   01/10/17 1619  BP: 108/60  Pulse: 94  Weight: 153 lb (69.4 kg)    Fetal Status: Fetal Heart Rate (bpm): 153 Fundal Height: 37 cm Movement: Present  Presentation: Vertex  General:  Alert, oriented and cooperative. Patient is in no acute distress.  Skin: Skin is warm and dry. No rash noted.   Cardiovascular: Normal heart rate noted  Respiratory: Normal respiratory effort, no problems with respiration noted  Abdomen: Soft, gravid, appropriate for gestational age. Pain/Pressure: Absent     Pelvic:  Cervical exam performed Dilation: 1 Effacement (%): 50 Station: -2 White discharge seen, sample taken for testing  Extremities: Normal range of motion.  Edema: None  Mental Status: Normal mood and affect. Normal behavior. Normal judgment and thought content.   Assessment and Plan:  Pregnancy: [id number] at 4057w4d  1. Back pain affecting pregnancy in third trimester Also recommended Tylenol as needed.  - cyclobenzaprine  (FLEXERIL) 10 MG tablet; Take 1 tablet (10 mg total) by mouth every 8 (eight) hours as needed for muscle spasms.  Dispense: 30 tablet; Refill: 1  2. Vagina itching Likely yeast, will follow up test.  - Cervicovaginal ancillary only - terconazole (TERAZOL 7) 0.4 % vaginal cream; Place 1 applicator vaginally at bedtime. Use for seven days  Dispense: 45 g; Refill: 0  3. Encounter for supervision of normal first pregnancy in third trimester - Culture, beta strep (group b only) [facility] consent signed, had 4 previous VBACs.  Preterm labor symptoms and general obstetric precautions including but not limited to vaginal bleeding, contractions, leaking of fluid and fetal movement were reviewed in detail with the patient. Please refer to After Visit Summary for other counseling recommendations.  Return in about 1 week (around 01/17/2017) for OB Visit.   Leah NewcomerUgonna A Isabele Lollar, MD

## 2017-01-10 NOTE — Patient Instructions (Addendum)
Return to clinic for any scheduled appointments or obstetric concerns, or go to [facility]U for evaluation  Thank you for enrolling in MyChart. Please follow the instructions below to securely access your online medical record. MyChart allows you to send messages to your doctor, view your test results, manage appointments, and more.   How Do I Sign Up? 1. In your Internet browser, go to [employer] Address [employer] and enter [email or web address]. 2. Click on the Sign Up Now link in the Sign In box. You will see the New Member Sign Up page. 3. Enter your MyChart Access Code exactly as it appears below. You will not need to use this code after you've completed the sign-up process. If you do not sign up before the expiration date, you must request a new code.  MyChart Access Code: [email or web address] Expires: 02/02/2017  3:18 PM  4. Enter your Social Security Number ([id number]) and Date of Birth (mm/dd/yyyy) as indicated and click Submit. You will be taken to the next sign-up page. 5. Create a MyChart ID. This will be your MyChart login ID and cannot be changed, so think of one that is secure and easy to remember. 6. Create a MyChart password. You can change your password at any time. 7. Enter your Password Reset Question and Answer. This can be used at a later time if you forget your password.  8. Enter your e-mail address. You will receive e-mail notification when new information is available in MyChart. 9. Click Sign Up. You can now view your medical record.   Additional Information Remember, MyChart is NOT to be used for urgent needs. For medical emergencies, dial 911.

## 2017-01-10 NOTE — Progress Notes (Signed)
C/O back pain and lack of sleep d/t unable to get comfortable

## 2017-01-11 LAB — CERVICOVAGINAL ANCILLARY ONLY
Bacterial vaginitis: NEGATIVE
Candida vaginitis: POSITIVE — AB
Chlamydia: NEGATIVE
Neisseria Gonorrhea: NEGATIVE
Trichomonas: NEGATIVE

## 2017-01-12 LAB — CULTURE, BETA STREP (GROUP B ONLY)

## 2017-01-18 ENCOUNTER — Encounter: Payer: Medicaid Other | Admitting: Family Medicine

## 2017-01-22 ENCOUNTER — Ambulatory Visit (INDEPENDENT_AMBULATORY_CARE_PROVIDER_SITE_OTHER): Payer: Medicaid Other | Admitting: Student

## 2017-01-22 VITALS — BP 105/59 | HR 93 | Wt 153.0 lb

## 2017-01-22 DIAGNOSIS — O34219 Maternal care for unspecified type scar from previous cesarean delivery: Secondary | ICD-10-CM

## 2017-01-22 DIAGNOSIS — A6009 Herpesviral infection of other urogenital tract: Secondary | ICD-10-CM

## 2017-01-22 DIAGNOSIS — O09899 Supervision of other high risk pregnancies, unspecified trimester: Secondary | ICD-10-CM

## 2017-01-22 DIAGNOSIS — A609 Anogenital herpesviral infection, unspecified: Secondary | ICD-10-CM

## 2017-01-22 DIAGNOSIS — Z3483 Encounter for supervision of other normal pregnancy, third trimester: Secondary | ICD-10-CM

## 2017-01-22 DIAGNOSIS — O98313 Other infections with a predominantly sexual mode of transmission complicating pregnancy, third trimester: Secondary | ICD-10-CM

## 2017-01-22 DIAGNOSIS — O98319 Other infections with a predominantly sexual mode of transmission complicating pregnancy, unspecified trimester: Secondary | ICD-10-CM

## 2017-01-22 MED ORDER — VALACYCLOVIR HCL 500 MG PO TABS: 500.0000 mg | ORAL_TABLET | Freq: Two times a day (BID) | ORAL | 1 refills | Status: DC

## 2017-01-22 NOTE — Progress Notes (Signed)
   PRENATAL VISIT NOTE  Subjective:  Leah Walls is a 31 y.o. [phone] at 10645w2d being seen today for ongoing prenatal care.  She is currently monitored for the following issues for this low-risk pregnancy and has Supervision of normal pregnancy; Short interval between pregnancies affecting pregnancy, antepartum; Previous cesarean delivery, antepartum; Grand multiparity, antepartum; Breast lump on right side at 1 o'clock position; Marijuana use; Genital herpes affecting pregnancy, antepartum; and Abdominal wall hernia on her problem list.  Patient reports backache.  Contractions: Irregular. Vag. Bleeding: None.  Movement: Present. Denies leaking of fluid.   The following portions of the patient's history were reviewed and updated as appropriate: allergies, current medications, past family history, past medical history, past social history, past surgical history and problem list. Problem list updated.  Objective:   Vitals:   01/22/17 1417  BP: (!) 105/59  Pulse: 93  Weight: 153 lb (69.4 kg)    Fetal Status: Fetal Heart Rate (bpm): 153   Movement: Present     General:  Alert, oriented and cooperative. Patient is in no acute distress.  Skin: Skin is warm and dry. No rash noted.   Cardiovascular: Normal heart rate noted  Respiratory: Normal respiratory effort, no problems with respiration noted  Abdomen: Soft, gravid, appropriate for gestational age. Pain/Pressure: Present     Pelvic:  Cervical exam deferred        Extremities: Normal range of motion.  Edema: None  Mental Status: Normal mood and affect. Normal behavior. Normal judgment and thought content.   Assessment and Plan:  Pregnancy: [id number] at 5945w2d  1. Encounter for supervision of other normal pregnancy in third trimester   2. Short interval between pregnancies affecting pregnancy, antepartum   3. Previous cesarean delivery, antepartum -Planning to [place]; signed consent on 01/11/2017  4. Genital herpes affecting  pregnancy, antepartum -Patient started on Valtrex; RX sent to the pharmacy  Term labor symptoms and general obstetric precautions including but not limited to vaginal bleeding, contractions, leaking of fluid and fetal movement were reviewed in detail with the patient. Please refer to After Visit Summary for other counseling recommendations.  Return in about 1 week (around 01/29/2017).   Leah Walls, CNM

## 2017-01-22 NOTE — Progress Notes (Signed)
Patient has HSV & needs acyclovir

## 2017-01-23 ENCOUNTER — Inpatient Hospital Stay (HOSPITAL_COMMUNITY)
Admission: AD | Admit: 2017-01-23 | Discharge: 2017-01-24 | Disposition: A | Discharge status: Home / Self Care | Payer: Medicaid Other | Source: Ambulatory Visit | Attending: Family Medicine | Admitting: Family Medicine

## 2017-01-23 ENCOUNTER — Encounter (HOSPITAL_COMMUNITY): Payer: Self-pay | Admitting: *Deleted

## 2017-01-23 DIAGNOSIS — O0940 Supervision of pregnancy with grand multiparity, unspecified trimester: Secondary | ICD-10-CM

## 2017-01-23 DIAGNOSIS — O34219 Maternal care for unspecified type scar from previous cesarean delivery: Secondary | ICD-10-CM

## 2017-01-23 DIAGNOSIS — Z3A00 Weeks of gestation of pregnancy not specified: Secondary | ICD-10-CM | POA: Insufficient documentation

## 2017-01-23 DIAGNOSIS — Z3490 Encounter for supervision of normal pregnancy, unspecified, unspecified trimester: Secondary | ICD-10-CM | POA: Insufficient documentation

## 2017-01-23 DIAGNOSIS — Z3483 Encounter for supervision of other normal pregnancy, third trimester: Secondary | ICD-10-CM

## 2017-01-23 DIAGNOSIS — O09899 Supervision of other high risk pregnancies, unspecified trimester: Secondary | ICD-10-CM

## 2017-01-23 NOTE — [facility] Note (Signed)
PT  SAYS SAYS   [facility]    ARE STRONG   SINCE  10PM.  NO VE IN OFFICE       HAS HX OF HSV-   LAST OUTBREAK-    THINKS  NOV-      GOT  RX  FOR  [facility]  YESTERDAY     - DENIES  ANY S/S  NOW.       DENIES MRSA.     GBS- NEG

## 2017-01-24 ENCOUNTER — Encounter: Payer: Self-pay | Admitting: Family Medicine

## 2017-01-24 ENCOUNTER — Telehealth: Payer: Self-pay | Admitting: *Deleted

## 2017-01-24 DIAGNOSIS — Z3A00 Weeks of gestation of pregnancy not specified: Secondary | ICD-10-CM | POA: Diagnosis not present

## 2017-01-24 DIAGNOSIS — Z3490 Encounter for supervision of normal pregnancy, unspecified, unspecified trimester: Secondary | ICD-10-CM | POA: Diagnosis present

## 2017-01-24 NOTE — Telephone Encounter (Addendum)
Pt left message stating that she is calling to speak with someone about her back hurting and the hours at her job.   2/14  Pt had prenatal visit w/Dr. Vergie LivingPickens on 2/12.

## 2017-01-24 NOTE — [facility] Note (Signed)
Notified provider that patient is here for a labor eval. Patient is unchanged after an hour (2/80/-3). Fetus reactive. Provider said to discharge patient with labor precautions.

## 2017-01-24 NOTE — Discharge Instructions (Signed)
Braxton Hicks Contractions Contractions of the uterus can occur throughout pregnancy. Contractions are not always a sign that you are in labor.  WHAT ARE BRAXTON HICKS CONTRACTIONS?  Contractions that occur before labor are called Braxton Hicks contractions, or false labor. Toward the end of pregnancy (32-34 weeks), these contractions can develop more often and may become more forceful. This is not true labor because these contractions do not result in opening (dilatation) and thinning of the cervix. They are sometimes difficult to tell apart from true labor because these contractions can be forceful and people have different pain tolerances. You should not feel embarrassed if you go to the hospital with false labor. Sometimes, the only way to tell if you are in true labor is for your health care provider to look for changes in the cervix. If there are no prenatal problems or other health problems associated with the pregnancy, it is completely safe to be sent home with false labor and await the onset of true labor. HOW CAN YOU TELL THE DIFFERENCE BETWEEN TRUE AND FALSE LABOR? False Labor   The contractions of false labor are usually shorter and not as hard as those of true labor.   The contractions are usually irregular.   The contractions are often felt in the front of the lower abdomen and in the groin.   The contractions may go away when you walk around or change positions while lying down.   The contractions get weaker and are shorter lasting as time goes on.   The contractions do not usually become progressively stronger, regular, and closer together as with true labor.  True Labor   Contractions in true labor last 30-70 seconds, become very regular, usually become more intense, and increase in frequency.   The contractions do not go away with walking.   The discomfort is usually felt in the top of the uterus and spreads to the lower abdomen and low back.   True labor can be  determined by your health care provider with an exam. This will show that the cervix is dilating and getting thinner.  WHAT TO REMEMBER  Keep up with your usual exercises and follow other instructions given by your health care provider.   Take medicines as directed by your health care provider.   Keep your regular prenatal appointments.   Eat and drink lightly if you think you are going into labor.   If Braxton Hicks contractions are making you uncomfortable:   Change your position from lying down or resting to walking, or from walking to resting.   Sit and rest in a tub of warm water.   Drink 2-3 glasses of water. Dehydration may cause these contractions.   Do slow and deep breathing several times an hour.  WHEN SHOULD I SEEK IMMEDIATE MEDICAL CARE? Seek immediate medical care if:  Your contractions become stronger, more regular, and closer together.   You have fluid leaking or gushing from your vagina.   You have a fever.   You pass blood-tinged mucus.   You have vaginal bleeding.   You have continuous abdominal pain.   You have low back pain that you never had before.   You feel your baby's head pushing down and causing pelvic pressure.   Your baby is not moving as much as it used to.  This information is not intended to replace advice given to you by your health care provider. Make sure you discuss any questions you have with your health care   provider. Document Released: 12/04/2005 Document Revised: 03/27/2016 Document Reviewed: 09/15/2013 Elsevier Interactive Patient Education  2017 Elsevier Inc. Introduction Patient Name: ________________________________________________ Patient Due Date: ____________________ What is a fetal movement count? A fetal movement count is the number of times that you feel your baby move during a certain amount of time. This may also be called a fetal kick count. A fetal movement count is recommended for every pregnant  woman. You may be asked to start counting fetal movements as early as week 28 of your pregnancy. Pay attention to when your baby is most active. You may notice your baby's sleep and wake cycles. You may also notice things that make your baby move more. You should do a fetal movement count:  When your baby is normally most active.  At the same time each day. A good time to count movements is while you are resting, after having something to eat and drink. How do I count fetal movements? 1. Find a quiet, comfortable area. Sit, or lie down on your side. 2. Write down the date, the start time and stop time, and the number of movements that you felt between those two times. Take this information with you to your health care visits. 3. For 2 hours, count kicks, flutters, swishes, rolls, and jabs. You should feel at least 10 movements during 2 hours. 4. You may stop counting after you have felt 10 movements. 5. If you do not feel 10 movements in 2 hours, have something to eat and drink. Then, keep resting and counting for 1 hour. If you feel at least 4 movements during that hour, you may stop counting. Contact a health care provider if:  You feel fewer than 4 movements in 2 hours.  Your baby is not moving like he or she usually does. Date: ____________ Start time: ____________ Stop time: ____________ Movements: ____________ Date: ____________ Start time: ____________ Stop time: ____________ Movements: ____________ Date: ____________ Start time: ____________ Stop time: ____________ Movements: ____________ Date: ____________ Start time: ____________ Stop time: ____________ Movements: ____________ Date: ____________ Start time: ____________ Stop time: ____________ Movements: ____________ Date: ____________ Start time: ____________ Stop time: ____________ Movements: ____________ Date: ____________ Start time: ____________ Stop time: ____________ Movements: ____________ Date: ____________ Start time:  ____________ Stop time: ____________ Movements: ____________ Date: ____________ Start time: ____________ Stop time: ____________ Movements: ____________ This information is not intended to replace advice given to you by your health care provider. Make sure you discuss any questions you have with your health care provider. Document Released: 01/03/2007 Document Revised: 08/02/2016 Document Reviewed: 01/13/2016 Elsevier Interactive Patient Education  2017 Elsevier Inc.  

## 2017-01-29 ENCOUNTER — Ambulatory Visit (INDEPENDENT_AMBULATORY_CARE_PROVIDER_SITE_OTHER): Payer: Medicaid Other | Admitting: Obstetrics and Gynecology

## 2017-01-29 VITALS — BP 116/69 | HR 74 | Wt 154.7 lb

## 2017-01-29 DIAGNOSIS — O34219 Maternal care for unspecified type scar from previous cesarean delivery: Secondary | ICD-10-CM | POA: Diagnosis not present

## 2017-01-29 DIAGNOSIS — O98319 Other infections with a predominantly sexual mode of transmission complicating pregnancy, unspecified trimester: Principal | ICD-10-CM

## 2017-01-29 DIAGNOSIS — A6009 Herpesviral infection of other urogenital tract: Secondary | ICD-10-CM

## 2017-01-29 DIAGNOSIS — O368130 Decreased fetal movements, third trimester, not applicable or unspecified: Secondary | ICD-10-CM | POA: Diagnosis not present

## 2017-01-29 DIAGNOSIS — A609 Anogenital herpesviral infection, unspecified: Secondary | ICD-10-CM

## 2017-01-29 DIAGNOSIS — O98313 Other infections with a predominantly sexual mode of transmission complicating pregnancy, third trimester: Secondary | ICD-10-CM | POA: Diagnosis present

## 2017-01-29 DIAGNOSIS — O0943 Supervision of pregnancy with grand multiparity, third trimester: Secondary | ICD-10-CM | POA: Diagnosis not present

## 2017-01-29 DIAGNOSIS — O368190 Decreased fetal movements, unspecified trimester, not applicable or unspecified: Secondary | ICD-10-CM

## 2017-01-29 DIAGNOSIS — K429 Umbilical hernia without obstruction or gangrene: Secondary | ICD-10-CM

## 2017-01-29 DIAGNOSIS — Z3483 Encounter for supervision of other normal pregnancy, third trimester: Secondary | ICD-10-CM

## 2017-01-29 DIAGNOSIS — O0940 Supervision of pregnancy with grand multiparity, unspecified trimester: Secondary | ICD-10-CM

## 2017-01-29 DIAGNOSIS — Z98891 History of uterine scar from previous surgery: Secondary | ICD-10-CM

## 2017-01-29 NOTE — Progress Notes (Signed)
Prenatal Visit Note Date: 01/29/2017 Clinic: [facility]  Subjective:  Anders SimmondsSade I Caracci is a 31 y.o. [phone] at 4323w2d being seen today for ongoing prenatal care.  She is currently monitored for the following issues for this low-risk pregnancy and has Supervision of normal pregnancy; Short interval between pregnancies affecting pregnancy, antepartum; History of VBAC x4; Grand multiparity, antepartum; Breast lump on right side at 1 o'clock position; Marijuana use; Genital herpes affecting pregnancy, antepartum; and Umbilical hernia on her problem list.  Patient reports no complaints.   Contractions: Irregular. Vag. Bleeding: None.  Movement: (!) Decreased. Denies leaking of fluid.   The following portions of the patient's history were reviewed and updated as appropriate: allergies, current medications, past family history, past medical history, past social history, past surgical history and problem list. Problem list updated.  Objective:   Vitals:   01/29/17 1603  BP: 116/69  Pulse: 74  Weight: 154 lb 11.2 oz (70.2 kg)    Fetal Status: Fetal Heart Rate (bpm): 137 Fundal Height: 38 cm Movement: (!) Decreased  Presentation: Vertex  General:  Alert, oriented and cooperative. Patient is in no acute distress.  Skin: Skin is warm and dry. No rash noted.   Cardiovascular: Normal heart rate noted  Respiratory: Normal respiratory effort, no problems with respiration noted  Abdomen: Soft, gravid, appropriate for gestational age. Pain/Pressure: Present     Pelvic:  Cervical exam deferred        Extremities: Normal range of motion.  Edema: None  Mental Status: Normal mood and affect. Normal behavior. Normal judgment and thought content.   Urinalysis:      Assessment and Plan:  Pregnancy: [id number] at 6023w2d  1. Genital herpes affecting pregnancy, antepartum No s/s. Continue valtrex. Set up PDIOL nv.   2. Umbilical hernia without obstruction and without gangrene 1.5x1.5cm.  Reducible nttp  3. Grand multiparity, antepartum Active third stage management  4. Encounter for supervision of other normal pregnancy in third trimester LARC  5. Decreased fetal movements in third trimester, single or unspecified fetus FKC reviewed with her. NST 140 baseline, +accels, no decel, mod variability, toco x 1 x 25min, reactive - Fetal nonstress test  6. History of VBAC x4 Consent signed already  Term labor symptoms and general obstetric precautions including but not limited to vaginal bleeding, contractions, leaking of fluid and fetal movement were reviewed in detail with the patient. Please refer to After Visit Summary for other counseling recommendations.  Return in about 1 week (around 02/05/2017) for  rob and NST.   Occidental Bingharlie Jhony Antrim, MD

## 2017-01-29 NOTE — Progress Notes (Signed)
Pt aware of good FM during NST today.

## 2017-02-05 ENCOUNTER — Ambulatory Visit (INDEPENDENT_AMBULATORY_CARE_PROVIDER_SITE_OTHER): Payer: Self-pay | Admitting: Clinical

## 2017-02-05 ENCOUNTER — Ambulatory Visit (INDEPENDENT_AMBULATORY_CARE_PROVIDER_SITE_OTHER): Payer: Medicaid Other | Admitting: Family Medicine

## 2017-02-05 VITALS — BP 107/62 | HR 100 | Wt 156.9 lb

## 2017-02-05 DIAGNOSIS — O480 Post-term pregnancy: Secondary | ICD-10-CM

## 2017-02-05 DIAGNOSIS — Z3483 Encounter for supervision of other normal pregnancy, third trimester: Secondary | ICD-10-CM | POA: Diagnosis not present

## 2017-02-05 DIAGNOSIS — F4323 Adjustment disorder with mixed anxiety and depressed mood: Secondary | ICD-10-CM

## 2017-02-05 NOTE — BH Specialist Note (Signed)
Session Start time: 3:25   End Time: 3:45 Total Time:  20 minutes Type of Service: Behavioral Health - Individual/Family Interpreter: No.   Interpreter Name & Language: n/a # [facility] July 2017-June 2018: [facility]  SUBJECTIVE: Leah Walls is a 31 y.o. female  Pt. was referred by Dr Adrian BlackwaterStinson for:  anxiety and depression. Pt. reports the following symptoms/concerns: Pt states her primary concern today is that she is overdue to give birth to her 6th child, is irritated, tired, and her goal today is to be able to get some rest before giving birth. Duration of problem:  Over one week Severity: moderate Previous treatment: none  OBJECTIVE: Mood: Anxious and Irritable & Affect: Appropriate (having contractions, mood is appropriate) Risk of harm to self or others: No known risk of harm to self or others Assessments administered: PHQ9: 7/ GAD7: 9  LIFE CONTEXT:  Family & Social: Lives with 5 children, ages 8514, 8011, 739,7,1) and FOB, her greatest support  School/ Work: First work day on maternity leave today  Self-Care: Wants to sleep for self-care Life changes: Current pregnancy What is important to pt/family (values): Rest and giving birth asap  GOALS ADDRESSED:  -Reduce symptoms of anxiety and depression  INTERVENTIONS: Solution Focused   ASSESSMENT:  Pt currently experiencing Adjustment disorder with mixed anxious and depressed mood.  Pt may benefit from psychoeducation and brief therapeutic intervention regarding coping with symptoms of anxiety and depression.   PLAN: 1. F/U with behavioral health clinician: Postpartum visit(about 6 weeks), or earlier, as needed 2. Behavioral Health meds: none 3. Behavioral recommendations:  -Use sleep app for self and 1yo daughter this evening, as self-care strategy for improved sleep(If not in active labor this evening) -Read educational material regarding coping with symptoms of anxiety and depression -Consider Mom Talk (Tuesdays 10am) and Baby and  Me (Thursdays 11am) classes [facility] 4. Referral: Brief Counseling/Psychotherapy, Publishing rights managerCommunity Resource and Psychoeducation 5. From scale of 1-10, how likely are you to follow plan: 7  [facility] Clinician  Behavioral Health Clinician  Marlon PelWarmhandoff:   Warm Hand Off Completed.        Depression screen [facility] 2/9 02/05/2017 01/29/2017 01/10/2017 11/15/2016 10/18/2016  Decreased Interest 3 1 3 2 2   Down, Depressed, Hopeless 0 0 0 0 0  PHQ - 2 Score 3 1 3 2 2   Altered sleeping 1 1 3 2 2   Tired, decreased energy 3 1 3 2 2   Change in appetite 0 0 3 2 2   Feeling bad or failure about yourself  0 1 3 0 0  Trouble concentrating 0 1 1 0 1  Moving slowly or fidgety/restless 0 0 1 1 0  Suicidal thoughts 0 0 0 0 0  PHQ-9 Score 7 5 17 9 9    GAD 7 : Generalized Anxiety Score 02/05/2017 01/10/2017 11/15/2016 10/18/2016  Nervous, Anxious, on Edge 1 1 1 1   Control/stop worrying 1 1 1 1   Worry too much - different things 1 1 1 1   Trouble relaxing 2 1 1 1   Restless 2 2 1 1   Easily annoyed or irritable 2 2 1 1   Afraid - awful might happen 0 0 1 1  Total GAD 7 Score 9 8 7  7

## 2017-02-05 NOTE — Progress Notes (Signed)
   PRENATAL VISIT NOTE  Subjective:  Leah Walls is a 31 y.o. [phone] at 7682w2d being seen today for ongoing prenatal care.  She is currently monitored for the following issues for this low-risk pregnancy and has Supervision of normal pregnancy; Short interval between pregnancies affecting pregnancy, antepartum; History of VBAC x4; Grand multiparity, antepartum; Breast lump on right side at 1 o'clock position; Marijuana use; Genital herpes affecting pregnancy, antepartum; and Umbilical hernia on her problem list.  Patient reports contractions.  Contractions: Irregular. Vag. Bleeding: Scant.  Movement: Present. Denies leaking of fluid.   The following portions of the patient's history were reviewed and updated as appropriate: allergies, current medications, past family history, past medical history, past social history, past surgical history and problem list. Problem list updated.  Objective:   Vitals:   02/05/17 1516  BP: 107/62  Pulse: 100  Weight: 156 lb 14.4 oz (71.2 kg)    Fetal Status: Fetal Heart Rate (bpm): NST   Movement: Present  Presentation: Vertex  General:  Alert, oriented and cooperative. Patient is in no acute distress.  Skin: Skin is warm and dry. No rash noted.   Cardiovascular: Normal heart rate noted  Respiratory: Normal respiratory effort, no problems with respiration noted  Abdomen: Soft, gravid, appropriate for gestational age. Pain/Pressure: Present     Pelvic:  Cervical exam performed Dilation: 3 Effacement (%): 80 Station: -2  Extremities: Normal range of motion.  Edema: Trace  Mental Status: Normal mood and affect. Normal behavior. Normal judgment and thought content.   Assessment and Plan:  Pregnancy: [id number] at 382w2d  1. Post term pregnancy, antepartum condition or complication NST reactive - Fetal nonstress test  2. Encounter for supervision of other normal pregnancy in third trimester   Term labor symptoms and general obstetric precautions  including but not limited to vaginal bleeding, contractions, leaking of fluid and fetal movement were reviewed in detail with the patient. Please refer to After Visit Summary for other counseling recommendations.  Return in about 3 days (around 02/08/2017) for NST/AFI.   Levie HeritageJacob J Johncarlos Holtsclaw, DO

## 2017-02-05 NOTE — Progress Notes (Signed)
IOL scheduled 2/24 @ 0700.

## 2017-02-06 ENCOUNTER — Inpatient Hospital Stay (HOSPITAL_COMMUNITY)
Admission: AD | Admit: 2017-02-06 | Discharge: 2017-02-07 | DRG: 774 | Disposition: A | Discharge status: Home / Self Care | Payer: Medicaid Other | Source: Ambulatory Visit | Attending: Family Medicine | Admitting: Family Medicine

## 2017-02-06 ENCOUNTER — Inpatient Hospital Stay (HOSPITAL_COMMUNITY): Payer: Medicaid Other | Admitting: Anesthesiology

## 2017-02-06 ENCOUNTER — Encounter (HOSPITAL_COMMUNITY): Payer: Self-pay

## 2017-02-06 DIAGNOSIS — Z98891 History of uterine scar from previous surgery: Secondary | ICD-10-CM

## 2017-02-06 DIAGNOSIS — Z87891 Personal history of nicotine dependence: Secondary | ICD-10-CM | POA: Diagnosis not present

## 2017-02-06 DIAGNOSIS — F1210 Cannabis abuse, uncomplicated: Secondary | ICD-10-CM | POA: Diagnosis present

## 2017-02-06 DIAGNOSIS — O9832 Other infections with a predominantly sexual mode of transmission complicating childbirth: Secondary | ICD-10-CM | POA: Diagnosis present

## 2017-02-06 DIAGNOSIS — Z3A40 40 weeks gestation of pregnancy: Secondary | ICD-10-CM

## 2017-02-06 DIAGNOSIS — Z3493 Encounter for supervision of normal pregnancy, unspecified, third trimester: Secondary | ICD-10-CM | POA: Diagnosis present

## 2017-02-06 DIAGNOSIS — A6000 Herpesviral infection of urogenital system, unspecified: Secondary | ICD-10-CM | POA: Diagnosis present

## 2017-02-06 DIAGNOSIS — O99324 Drug use complicating childbirth: Secondary | ICD-10-CM | POA: Diagnosis present

## 2017-02-06 DIAGNOSIS — Z3483 Encounter for supervision of other normal pregnancy, third trimester: Secondary | ICD-10-CM

## 2017-02-06 DIAGNOSIS — O34211 Maternal care for low transverse scar from previous cesarean delivery: Principal | ICD-10-CM | POA: Diagnosis present

## 2017-02-06 DIAGNOSIS — Z8249 Family history of ischemic heart disease and other diseases of the circulatory system: Secondary | ICD-10-CM

## 2017-02-06 DIAGNOSIS — O0940 Supervision of pregnancy with grand multiparity, unspecified trimester: Secondary | ICD-10-CM

## 2017-02-06 DIAGNOSIS — O09899 Supervision of other high risk pregnancies, unspecified trimester: Secondary | ICD-10-CM

## 2017-02-06 LAB — TYPE AND SCREEN
ABO/RH(D): O POS
Antibody Screen: NEGATIVE

## 2017-02-06 LAB — RPR: RPR Ser Ql: NONREACTIVE

## 2017-02-06 LAB — CBC
HCT: 28.6 % — ABNORMAL LOW (ref 36.0–46.0)
Hemoglobin: 9.5 g/dL — ABNORMAL LOW (ref 12.0–15.0)
MCH: 29.1 pg (ref 26.0–34.0)
MCHC: 33.2 g/dL (ref 30.0–36.0)
MCV: 87.7 fL (ref 78.0–100.0)
Platelets: 241 10*3/uL (ref 150–400)
RBC: 3.26 MIL/uL — ABNORMAL LOW (ref 3.87–5.11)
RDW: 14 % (ref 11.5–15.5)
WBC: 12.8 10*3/uL — ABNORMAL HIGH (ref 4.0–10.5)

## 2017-02-06 MED ORDER — MEASLES, MUMPS & RUBELLA VAC ~~LOC~~ INJ: 0.5000 mL | INJECTION | Freq: Once | SUBCUTANEOUS | Status: DC

## 2017-02-06 MED ORDER — ZOLPIDEM TARTRATE 5 MG PO TABS: 5.0000 mg | ORAL_TABLET | Freq: Every evening | ORAL | Status: DC | PRN

## 2017-02-06 MED ORDER — IBUPROFEN 600 MG PO TABS
600.0000 mg | ORAL_TABLET | Freq: Four times a day (QID) | ORAL | Status: DC
  Administered 2017-02-06 – 2017-02-07 (×5): 600 mg via ORAL
  Filled 2017-02-06 (×6): qty 1

## 2017-02-06 MED ORDER — OXYTOCIN 40 UNITS IN LACTATED RINGERS INFUSION - SIMPLE MED
2.5000 [IU]/h | INTRAVENOUS | Status: DC
  Filled 2017-02-06: qty 1000

## 2017-02-06 MED ORDER — ACETAMINOPHEN 325 MG PO TABS
650.0000 mg | ORAL_TABLET | ORAL | Status: DC | PRN
  Administered 2017-02-06 – 2017-02-07 (×3): 650 mg via ORAL
  Filled 2017-02-06 (×3): qty 2

## 2017-02-06 MED ORDER — LACTATED RINGERS IV SOLN
500.0000 mL | INTRAVENOUS | Status: DC | PRN
  Administered 2017-02-06: 1000 mL via INTRAVENOUS

## 2017-02-06 MED ORDER — DIBUCAINE 1 % RE OINT: 1.0000 "application " | TOPICAL_OINTMENT | RECTAL | Status: DC | PRN

## 2017-02-06 MED ORDER — OXYCODONE-ACETAMINOPHEN 5-325 MG PO TABS: 2.0000 | ORAL_TABLET | ORAL | Status: DC | PRN

## 2017-02-06 MED ORDER — ONDANSETRON HCL 4 MG/2ML IJ SOLN
4.0000 mg | Freq: Four times a day (QID) | INTRAMUSCULAR | Status: DC | PRN
  Administered 2017-02-06: 4 mg via INTRAVENOUS
  Filled 2017-02-06: qty 2

## 2017-02-06 MED ORDER — SIMETHICONE 80 MG PO CHEW: 80.0000 mg | CHEWABLE_TABLET | ORAL | Status: DC | PRN

## 2017-02-06 MED ORDER — OXYTOCIN 40 UNITS IN LACTATED RINGERS INFUSION - SIMPLE MED: 1.0000 m[IU]/min | INTRAVENOUS | Status: DC

## 2017-02-06 MED ORDER — OXYTOCIN BOLUS FROM INFUSION
500.0000 mL | Freq: Once | INTRAVENOUS | Status: AC
  Administered 2017-02-06: 500 mL/h via INTRAVENOUS

## 2017-02-06 MED ORDER — ONDANSETRON HCL 4 MG/2ML IJ SOLN: 4.0000 mg | INTRAMUSCULAR | Status: DC | PRN

## 2017-02-06 MED ORDER — LACTATED RINGERS IV SOLN
INTRAVENOUS | Status: DC
  Administered 2017-02-06: 05:00:00 via INTRAVENOUS

## 2017-02-06 MED ORDER — FENTANYL 2.5 MCG/ML BUPIVACAINE 1/10 % EPIDURAL INFUSION (WH - ANES)
INTRAMUSCULAR | Status: DC | PRN
  Administered 2017-02-06: 14 mL/h via EPIDURAL

## 2017-02-06 MED ORDER — SENNOSIDES-DOCUSATE SODIUM 8.6-50 MG PO TABS
2.0000 | ORAL_TABLET | ORAL | Status: DC
  Filled 2017-02-06: qty 2

## 2017-02-06 MED ORDER — ACETAMINOPHEN 325 MG PO TABS: 650.0000 mg | ORAL_TABLET | ORAL | Status: DC | PRN

## 2017-02-06 MED ORDER — BENZOCAINE-MENTHOL 20-0.5 % EX AERO
1.0000 "application " | INHALATION_SPRAY | CUTANEOUS | Status: DC | PRN
  Administered 2017-02-06: 1 via TOPICAL
  Filled 2017-02-06 (×2): qty 56

## 2017-02-06 MED ORDER — TETANUS-DIPHTH-ACELL PERTUSSIS 5-2.5-18.5 LF-MCG/0.5 IM SUSP: 0.5000 mL | Freq: Once | INTRAMUSCULAR | Status: DC

## 2017-02-06 MED ORDER — FLEET ENEMA 7-19 GM/118ML RE ENEM: 1.0000 | ENEMA | RECTAL | Status: DC | PRN

## 2017-02-06 MED ORDER — DIPHENHYDRAMINE HCL 25 MG PO CAPS: 25.0000 mg | ORAL_CAPSULE | Freq: Four times a day (QID) | ORAL | Status: DC | PRN

## 2017-02-06 MED ORDER — PHENYLEPHRINE 40 MCG/ML (10ML) SYRINGE FOR IV PUSH (FOR BLOOD PRESSURE SUPPORT)
PREFILLED_SYRINGE | INTRAVENOUS | Status: AC
  Filled 2017-02-06: qty 10

## 2017-02-06 MED ORDER — BUPIVACAINE HCL (PF) 0.25 % IJ SOLN
INTRAMUSCULAR | Status: DC | PRN
  Administered 2017-02-06 (×2): 4 mL via EPIDURAL

## 2017-02-06 MED ORDER — LIDOCAINE HCL (PF) 1 % IJ SOLN
30.0000 mL | INTRAMUSCULAR | Status: DC | PRN
  Filled 2017-02-06: qty 30

## 2017-02-06 MED ORDER — TERBUTALINE SULFATE 1 MG/ML IJ SOLN
0.2500 mg | Freq: Once | INTRAMUSCULAR | Status: DC | PRN
  Filled 2017-02-06: qty 1

## 2017-02-06 MED ORDER — FENTANYL 2.5 MCG/ML BUPIVACAINE 1/10 % EPIDURAL INFUSION (WH - ANES)
INTRAMUSCULAR | Status: AC
Start: 2017-02-06 — End: 2017-02-06
  Filled 2017-02-06: qty 100

## 2017-02-06 MED ORDER — OXYCODONE-ACETAMINOPHEN 5-325 MG PO TABS: 1.0000 | ORAL_TABLET | ORAL | Status: DC | PRN

## 2017-02-06 MED ORDER — WITCH HAZEL-GLYCERIN EX PADS: 1.0000 "application " | MEDICATED_PAD | CUTANEOUS | Status: DC | PRN

## 2017-02-06 MED ORDER — SOD CITRATE-CITRIC ACID 500-334 MG/5ML PO SOLN: 30.0000 mL | ORAL | Status: DC | PRN

## 2017-02-06 MED ORDER — LIDOCAINE-EPINEPHRINE (PF) 2 %-1:200000 IJ SOLN
INTRAMUSCULAR | Status: DC | PRN
  Administered 2017-02-06: 3 mL

## 2017-02-06 MED ORDER — VALACYCLOVIR HCL 500 MG PO TABS: 500.0000 mg | ORAL_TABLET | Freq: Two times a day (BID) | ORAL | Status: DC

## 2017-02-06 MED ORDER — DIPHENHYDRAMINE HCL 50 MG/ML IJ SOLN
INTRAMUSCULAR | Status: AC
  Filled 2017-02-06: qty 1

## 2017-02-06 MED ORDER — PRENATAL MULTIVITAMIN CH
1.0000 | ORAL_TABLET | Freq: Every day | ORAL | Status: DC
  Administered 2017-02-07: 1 via ORAL
  Filled 2017-02-06: qty 1

## 2017-02-06 MED ORDER — ONDANSETRON HCL 4 MG PO TABS: 4.0000 mg | ORAL_TABLET | ORAL | Status: DC | PRN

## 2017-02-06 MED ORDER — COCONUT OIL OIL
1.0000 "application " | TOPICAL_OIL | Status: DC | PRN
  Filled 2017-02-06: qty 120

## 2017-02-06 NOTE — Anesthesia Postprocedure Evaluation (Signed)
Anesthesia Post Note  Patient: Leah Walls  Procedure(s) Performed: * No procedures listed *  Patient location during evaluation: Mother Baby Anesthesia Type: Epidural Level of consciousness: awake and alert Pain management: pain level controlled Vital Signs Assessment: post-procedure vital signs reviewed and stable Respiratory status: spontaneous breathing Cardiovascular status: stable Postop Assessment: no headache, no backache, patient able to bend at knees, adequate PO intake and no signs of nausea or vomiting Anesthetic complications: no        Last Vitals:  Vitals:   02/06/17 1101 02/06/17 1116  BP: 107/64 103/62  Pulse: 93 89  Resp: 18 16  Temp:      Last Pain:  Vitals:   02/06/17 1145  TempSrc:   PainSc: 0-No pain   Pain Goal: Patients Stated Pain Goal: 4 (02/06/17 0450)               Katina DungMOORE,Oluwatomiwa Kinyon C

## 2017-02-06 NOTE — Anesthesia Preprocedure Evaluation (Signed)
Anesthesia Evaluation  Patient identified by MRN, date of birth, ID band Patient awake    Reviewed: Allergy & Precautions, Patient's Chart, lab work & pertinent test results  History of Anesthesia Complications Negative for: history of anesthetic complications  Airway Mallampati: II  TM Distance: >3 FB Neck ROM: Full    Dental  (+) Teeth Intact   Pulmonary former smoker,    breath sounds clear to auscultation       Cardiovascular negative cardio ROS   Rhythm:Regular     Neuro/Psych  Headaches, negative psych ROS   GI/Hepatic negative GI ROS, Neg liver ROS,   Endo/Other  negative endocrine ROS  Renal/GU negative Renal ROS     Musculoskeletal   Abdominal   Peds  Hematology  (+) anemia ,   Anesthesia Other Findings   Reproductive/Obstetrics (+) Pregnancy                             Anesthesia Physical Anesthesia Plan  ASA: II  Anesthesia Plan: Epidural   Post-op Pain Management:    Induction:   Airway Management Planned:   Additional Equipment:   Intra-op Plan:   Post-operative Plan:   Informed Consent: I have reviewed the patients History and Physical, chart, labs and discussed the procedure including the risks, benefits and alternatives for the proposed anesthesia with the patient or authorized representative who has indicated his/her understanding and acceptance.     Plan Discussed with: Anesthesiologist  Anesthesia Plan Comments:         Anesthesia Quick Evaluation

## 2017-02-06 NOTE — Progress Notes (Addendum)
Received pt from MAU.   0430: IV and labs done.   Admission charting initiated. See flow sheet for details.   0507: anesthesia in unit. Report status of pt given  0514: Anesthesia at bs  0516: T/O for epidural done with everyone in agreement  0522: Test dose.  Denies S/E of test dose   Instructed pt not not get oob due to epidural placement.   [id number]: Relinquished care over to another nurse to do epidural on another pt.   0620: pt checked by another Rn. Resting quietly with eyes closed.

## 2017-02-06 NOTE — Progress Notes (Signed)
Leah Walls is a 31 y.o. [id number] at 9261w3d  Subjective: Pt doing well. Says no pain with epidural.   Objective: BP 112/63   Pulse 92   Temp 98.1 F (36.7 C) (Oral)   Resp 18   LMP 04/29/2016   SpO2 96%  No intake/output data recorded. No intake/output data recorded.  FHT:  FHR: 145 bpm, variability: moderate,  accelerations:  Present,  decelerations:  Absent UC:   regular, every 5 minutes SVE:   Dilation: 6 Effacement (%): 90 Station: 0 Exam by:: Dr Adrian BlackwaterStinson, Resident  Labs: Lab Results  Component Value Date   WBC 12.8 (H) 02/06/2017   HGB 9.5 (L) 02/06/2017   HCT 28.6 (L) 02/06/2017   MCV 87.7 02/06/2017   PLT 241 02/06/2017    Assessment / Plan: Spontaneous labor, progressing normally, AROM 0900 with light-mod mec  Labor: Progressing normally Preeclampsia:  None Fetal Wellbeing:  Category I Pain Control:  Epidural I/D:  n/a Anticipated MOD:  NSVD  Wendee BeaversDavid J Wardell Pokorski 02/06/2017, 9:11 AM

## 2017-02-06 NOTE — H&P (Signed)
Leah Walls is a 31 y.o. female presenting for labor. OB History    Gravida Para Term Preterm AB Living   6 5 4 1   5    SAB TAB Ectopic Multiple Live Births         0 4     Past Medical History:  Diagnosis Date  . Headache   . Hernia, abdominal 2011   Past Surgical History:  Procedure Laterality Date  . CESAREAN SECTION    . DILATION AND EVACUATION Bilateral 07/14/2015   Procedure: DILATATION AND EVACUATION;  Surgeon: Kathreen CosierBernard A Marshall, MD;  Location: [facility];  Service: Gynecology;  Laterality: Bilateral;  . FOREARM FRACTURE SURGERY Right 2008  . WISDOM TOOTH EXTRACTION     Family History: family history includes Hypertension in her maternal grandmother, mother, and paternal grandmother. Social History:  reports that she has quit smoking. Her smoking use included Cigarettes. She has never used smokeless tobacco. She reports that she does not drink alcohol or use drugs.     Maternal Diabetes: No Genetic Screening: Normal Maternal Ultrasounds/Referrals: Normal Fetal Ultrasounds or other Referrals:  None Maternal Substance Abuse:  Yes:  Type: Marijuana Significant Maternal Medications:  None Significant Maternal Lab Results:  None Other Comments:  None  Review of Systems  Constitutional: Negative.   Gastrointestinal: Positive for abdominal pain.   Maternal Medical History:  Reason for admission: Contractions.   Contractions: Onset was 6-12 hours ago.   Frequency: regular.   Duration is approximately 3 minutes.   Perceived severity is strong.    Fetal activity: Perceived fetal activity is normal.   Last perceived fetal movement was within the past hour.    Prenatal complications: Substance abuse (THC).   Previous CS-VBAC x4, grand multip, hx HSV on suppression, +THC, short pregnancy interval  Prenatal Complications - Diabetes: none.    Dilation: 7 Effacement (%): 90 Station: -1 Exam by:: C. Neill RNC Blood pressure 121/68, pulse 100, temperature 97.8 F  (36.6 C), temperature source Oral, resp. rate 21, last menstrual period 04/29/2016, SpO2 100 %, not currently breastfeeding. Maternal Exam:  Uterine Assessment: Contraction strength is moderate.  Contraction duration is 4 minutes. Contraction frequency is regular.   Abdomen: Patient reports no abdominal tenderness. Fetal presentation: vertex     Fetal Exam Fetal Monitor Review: Mode: ultrasound.   Baseline rate: 150.  Variability: moderate (6-25 bpm).   Pattern: accelerations present and no decelerations.    Fetal State Assessment: Category I - tracings are normal.     Physical Exam  Nursing note and vitals reviewed. Constitutional: She is oriented to person, place, and time. She appears well-developed and well-nourished. No distress.  HENT:  Head: Normocephalic and atraumatic.  Neck: Normal range of motion.  Cardiovascular: Normal rate.   Respiratory: Effort normal.  Musculoskeletal: Normal range of motion.  Neurological: She is alert and oriented to person, place, and time.  Skin: Skin is warm and dry.  Psychiatric: She has a normal mood and affect.    Prenatal labs: ABO, Rh: O/POS/-- (08/07 1046) Antibody: NEG (08/07 1046) Rubella: 3.15 (08/07 1046) RPR: NON REAC (11/29 0912)  HBsAg: NEGATIVE (08/07 1046)  HIV: NONREACTIVE (11/29 0912)  GBS:  NEG  Assessment/Plan: 40.[redacted] weeks gestation TOLAC Cat I FHT GBS neg [facility] Anticipate [facility]AC   Leah Walls , CNM 02/06/2017, 4:14 AM

## 2017-02-06 NOTE — Lactation Note (Signed)
This note was copied from a baby's chart. Lactation Consultation Note  LC saw mom in L and D with dad holding infant in blankets.  Mom states she does not like the feeling of infant at breast so she exclusively pumps.  Previous two children she pumped her BM for 8-9 months.  States she rented a pump from us in the past and does not use WIC.  LC left lacation brochure and resource sheet with mom and BF support group information.  LC also reminded mom to pump every 3 hours in order to secure and protect milk supply.  Pt expressed understanding.   [facility]; mom on phone.  [facility] cleaning/(liquid and pan), LC waited for mom to get off of phone in order to review pump details.  Mom states she would just call out if she needed help or had questions.  Dad states that they pumped using our hospital pump with the now 31 year old so they are familiar with pump and parts.    Patient Name: Boy Leah Walls ZOXWR'UToday's Date: 02/06/2017 Reason for consult: Initial assessment   Maternal Data Formula Feeding for Exclusion: No Has patient been taught Hand Expression?: No (declined/states she only wants to pump) Does the patient have breastfeeding experience prior to this delivery?: Yes  Feeding Feeding Type: Bottle Fed - Formula Nipple Type: Regular  LATCH Score/Interventions                      Lactation Tools Discussed/Used Tools: Pump WIC Program: No Pump Review:  ([facility]: mom states she will call back if needing help: but mom states she is familiar with pumpp) Initiated by:: Threasa AlphaKelly Matsuko Kretz Date initiated:: 02/06/17   Consult Status Consult Status: Follow-up Date: 02/07/17 Follow-up type: In-patient    Maryruth HancockKelly Suzanne [facility] 02/06/2017, 1:52 PM

## 2017-02-06 NOTE — Progress Notes (Signed)
Delivery of live viable female by Dr Abelardo DieselMcMullen, assisted by DR Adrian BlackwaterStinson. APGARS 9,9

## 2017-02-06 NOTE — Progress Notes (Signed)
[id number] Pt complaining of SOB. Pulse ox applied, confirmed at 100%. Pt states, "I  Am having a hard time catching my breath." As patient is coughing, called RRRNOB, CC, and Dr Adrian BlackwaterStinson for delivery.

## 2017-02-06 NOTE — Progress Notes (Signed)
Notified of pt arrival in MAU and advanced dilation. Ok to admit to labor and delivery. Confirmed pt is taking valtrex for suppression.

## 2017-02-06 NOTE — Anesthesia Procedure Notes (Signed)
Epidural Patient location during procedure: OB Start time: 02/06/2017 5:39 AM End time: 02/06/2017 5:58 AM  Staffing Anesthesiologist: Val EagleMOSER, Takeria Marquina Performed: anesthesiologist   Preanesthetic Checklist Completed: patient identified, surgical consent, pre-op evaluation, timeout performed, IV checked, risks and benefits discussed and monitors and equipment checked  Epidural Patient position: sitting Prep: DuraPrep Patient monitoring: heart rate, cardiac monitor, continuous pulse ox and blood pressure Approach: midline Location: L3-L4 Injection technique: LOR saline  Needle:  Needle type: Tuohy  Needle gauge: 17 G Needle length: 9 cm Needle insertion depth: 6 cm Catheter type: closed end flexible Catheter size: 19 Gauge Catheter at skin depth: 12 cm Test dose: negative and 2% lidocaine with Epi 1:200 K  Assessment Events: blood not aspirated, injection not painful, no injection resistance, negative IV test and no paresthesia  Additional Notes Reason for block:procedure for pain

## 2017-02-06 NOTE — MAU Note (Signed)
PT presents complaining of contractions worsening over the last few hours and bloody show. Membranes swept today. Denies leaking. Reports good fetal movement.

## 2017-02-07 ENCOUNTER — Telehealth (HOSPITAL_COMMUNITY): Payer: Self-pay | Admitting: *Deleted

## 2017-02-07 NOTE — Discharge Instructions (Signed)
Iron-Rich Diet Introduction Iron is a mineral that helps your body to produce hemoglobin. Hemoglobin is a protein in your red blood cells that carries oxygen to your body's tissues. Eating too little iron may cause you to feel weak and tired, and it can increase your risk for infection. Eating enough iron is necessary for your body's metabolism, muscle function, and nervous system. Iron is naturally found in many foods. It can also be added to foods or fortified in foods. There are two types of dietary iron:  Heme iron. Heme iron is absorbed by the body more easily than nonheme iron. Heme iron is found in meat, poultry, and fish.  Nonheme iron. Nonheme iron is found in dietary supplements, iron-fortified grains, beans, and vegetables. You may need to follow an iron-rich diet if:  You have been diagnosed with iron deficiency or iron-deficiency anemia.  You have a condition that prevents you from absorbing dietary iron, such as:  Infection in your intestines.  Celiac disease. This involves long-lasting (chronic) inflammation of your intestines.  You do not eat enough iron.  You eat a diet that is high in foods that impair iron absorption.  You have lost a lot of blood.  You have heavy bleeding during your menstrual cycle.  You are pregnant. What is my plan? Your health care provider may help you to determine how much iron you need per day based on your condition. Generally, when a person consumes sufficient amounts of iron in the diet, the following iron needs are met:  Men.  76-38 years old: 11 mg per day.  23-41 years old: 8 mg per day.  Women.  53-52 years old: 15 mg per day.  56-64 years old: 18 mg per day.  Over 82 years old: 8 mg per day.  Pregnant women: 27 mg per day.  Breastfeeding women: 9 mg per day. What do I need to know about an iron-rich diet?  Eat fresh fruits and vegetables that are high in vitamin C along with foods that are high in iron. This will  help increase the amount of iron that your body absorbs from food, especially with foods containing nonheme iron. Foods that are high in vitamin C include oranges, peppers, tomatoes, and mango.  Take iron supplements only as directed by your health care provider. Overdose of iron can be life-threatening. If you were prescribed iron supplements, take them with orange juice or a vitamin C supplement.  Cook foods in pots and pans that are made from iron.  Eat nonheme iron-containing foods alongside foods that are high in heme iron. This helps to improve your iron absorption.  Certain foods and drinks contain compounds that impair iron absorption. Avoid eating these foods in the same meal as iron-rich foods or with iron supplements. These include:  Coffee, black tea, and red wine.  Milk, dairy products, and foods that are high in calcium.  Beans, soybeans, and peas.  Whole grains.  When eating foods that contain both nonheme iron and compounds that impair iron absorption, follow these tips to absorb iron better.  Soak beans overnight before cooking.  Soak whole grains overnight and drain them before using.  Ferment flours before baking, such as using yeast in bread dough. What foods can I eat? Grains  Iron-fortified breakfast cereal. Iron-fortified whole-wheat bread. Enriched rice. Sprouted grains. Vegetables  Spinach. Potatoes with skin. Green peas. Broccoli. Red and green bell peppers. Fermented vegetables. Fruits  Prunes. Raisins. Oranges. Strawberries. Mango. Grapefruit. Meats and Other Protein  Sources  °Beef liver. Oysters. Beef. Shrimp. Turkey. Chicken. Tuna. Sardines. Chickpeas. Nuts. Tofu. °Beverages  °Tomato juice. Fresh orange juice. Prune juice. Hibiscus tea. Fortified instant breakfast shakes. °Condiments  °Tahini. Fermented soy sauce. °Sweets and Desserts  °Black-strap molasses. °Other  °Wheat germ. °The items listed above may not be a complete list of recommended foods or  beverages. Contact your dietitian for more options.  °What foods are not recommended? °Grains  °Whole grains. Bran cereal. Bran flour. Oats. °Vegetables  °Artichokes. Brussels sprouts. Kale. °Fruits  °Blueberries. Raspberries. Strawberries. Figs. °Meats and Other Protein Sources  °Soybeans. Products made from soy protein. °Dairy  °Milk. Cream. Cheese. Yogurt. Cottage cheese. °Beverages  °Coffee. Black tea. Red wine. °Sweets and Desserts  °Cocoa. Chocolate. Ice cream. °Other  °Basil. Oregano. Parsley. °The items listed above may not be a complete list of foods and beverages to avoid. Contact your dietitian for more information.  °This information is not intended to replace advice given to you by your health care provider. Make sure you discuss any questions you have with your health care provider. °Document Released: 07/18/2005 Document Revised: 06/23/2016 Document Reviewed: 07/01/2014 °© 2017 Elsevier °Home Care Instructions for Mom °Introduction ° ACTIVITY °· Gradually return to your regular activities. °· Let yourself rest. Nap while your baby sleeps. °· Avoid lifting anything that is heavier than 10 lb (4.5 kg) until your health care provider says it is okay. °· Avoid activities that take a lot of effort and energy (are strenuous) until approved by your health care provider. Walking at a slow-to-moderate pace is usually safe. °· If you had a cesarean delivery: °¨ Do not vacuum, climb stairs, or drive a car for 4-6 weeks. °¨ Have someone help you at home until you feel like you can do your usual activities yourself. °¨ Do exercises as told by your health care provider, if this applies. °VAGINAL BLEEDING °You may continue to bleed for 4-6 weeks after delivery. Over time, the amount of blood usually decreases and the color of the blood usually gets lighter. However, the flow of bright red blood may increase if you have been too active. If you need to use more than one pad in an hour because your pad gets soaked, or  if you pass a large clot: °· Lie down. °· Raise your feet. °· Place a cold compress on your lower abdomen. °· Rest. °· Call your health care provider. °If you are breastfeeding, your period should return anytime between 8 weeks after delivery and the time that you stop breastfeeding. If you are not breastfeeding, your period should return 6-8 weeks after delivery. °PERINEAL CARE °The perineal area, or perineum, is the part of your body between your thighs. After delivery, this area needs special care. Follow these instructions as told by your health care provider. °· Take warm tub baths for 15-20 minutes. °· Use medicated pads and pain-relieving sprays and creams as told. °· Do not use tampons or douches until vaginal bleeding has stopped. °· Each time you go to the bathroom: °¨ Use a peri bottle. °¨ Change your pad. °¨ Use towelettes in place of toilet paper until your stitches have healed. °· Do Kegel exercises every day. Kegel exercises help to maintain the muscles that support the vagina, bladder, and bowels. You can do these exercises while you are standing, sitting, or lying down. To do Kegel exercises: °¨ Tighten the muscles of your abdomen and the muscles that surround your birth canal. °¨ Hold for a few   seconds.  Relax.  Repeat until you have done this 5 times in a row.  To prevent hemorrhoids from developing or getting worse:  Drink enough fluid to keep your urine clear or pale yellow.  Avoid straining when having a bowel movement.  Take over-the-counter medicines and stool softeners as told by your health care provider. BREAST CARE  Wear a tight-fitting bra.  Avoid taking over-the-counter pain medicine for breast discomfort.  Apply ice to the breasts to help with discomfort as needed:  Put ice in a plastic bag.  Place a towel between your skin and the bag.  Leave the ice on for 20 minutes or as told by your health care provider. NUTRITION  Eat a well-balanced diet.  Do not  try to lose weight quickly by cutting back on calories.  Take your prenatal vitamins until your postpartum checkup or until your health care provider tells you to stop. POSTPARTUM DEPRESSION You may find yourself crying for no apparent reason and unable to cope with all of the changes that come with having a newborn. This mood is called postpartum depression. Postpartum depression happens because your hormone levels change after delivery. If you have postpartum depression, get support from your partner, friends, and family. If the depression does not go away on its own after several weeks, contact your health care provider. BREAST SELF-EXAM Do a breast self-exam each month, at the same time of the month. If you are breastfeeding, check your breasts just after a feeding, when your breasts are less full. If you are breastfeeding and your period has started, check your breasts on day 5, 6, or 7 of your period. Report any lumps, bumps, or discharge to your health care provider. Know that breasts are normally lumpy if you are breastfeeding. This is temporary, and it is not a health risk. INTIMACY AND SEXUALITY Avoid sexual activity for at least 3-4 weeks after delivery or until the brownish-red vaginal flow is completely gone. If you want to avoid pregnancy, use some form of birth control. You can get pregnant after delivery, even if you have not had your period. SEEK MEDICAL CARE IF:  You feel unable to cope with the changes that a child brings to your life, and these feelings do not go away after several weeks.  You notice a lump, a bump, or discharge on your breast. SEEK IMMEDIATE MEDICAL CARE IF:  Blood soaks your pad in 1 hour or less.  You have:  Severe pain or cramping in your lower abdomen.  A bad-smelling vaginal discharge.  A fever that is not controlled by medicine.  A fever, and an area of your breast is red and sore.  Pain or redness in your calf.  Sudden, severe chest  pain.  Shortness of breath.  Painful or bloody urination.  Problems with your vision.  You vomit for 12 hours or longer.  You develop a severe headache.  You have serious thoughts about hurting yourself, your child, or anyone else. This information is not intended to replace advice given to you by your health care provider. Make sure you discuss any questions you have with your health care provider. Document Released: 12/01/2000 Document Revised: 05/11/2016 Document Reviewed: 06/07/2015  2017 Elsevier

## 2017-02-07 NOTE — Discharge Summary (Signed)
Obstetric Discharge Summary Reason for Admission: term labor Prenatal Procedures: ultrasound Intrapartum Procedures: spontaneous vaginal delivery Postpartum Procedures: none Complications-Operative and Postpartum: none Hemoglobin  Date Value Ref Range Status  02/06/2017 9.5 (L) 12.0 - 15.0 g/dL Final   HCT  Date Value Ref Range Status  02/06/2017 28.6 (L) 36.0 - 46.0 % Final    Physical Exam:  General: alert Lochia: appropriate Uterine Fundus: firm and NT at U-2 DVT Evaluation: No evidence of DVT seen on physical exam.  Discharge Diagnoses: Term Pregnancy-delivered  Discharge Information: Date: 02/07/2017 Activity: pelvic rest Diet: routine Medications: Ibuprofen Condition: stable Instructions: refer to practice specific booklet Discharge to: home  She plans Nexplanon at 4 week PP visit. Follow-up Information    Levie HeritageJacob J Stinson, DO. Schedule an appointment as soon as possible for a visit in 5 week(s).   Specialty:  Family Medicine Why:  Will get Nexplanon at that visit Contact information: [address] [place] [address] [phone]           Newborn Data: Live born female  Birth Weight: 7 lb 3 oz (3260 g) APGAR: 9, 9  Home with mother.  Leah BossierMyra C Mareta Walls 02/07/2017, 8:47 AM

## 2017-02-07 NOTE — Plan of Care (Signed)
Problem: Nutritional: Goal: Mothers verbalization of comfort with breastfeeding process will improve Outcome: Completed/Met Date Met: 02/07/17 Pt wants to pump and bottle feed breastmilk and supplement with formula until she is pumping enough breastmilk.  Pt has DEBP set up and knows how to use.  Reinforced frequency of pumping.

## 2017-02-07 NOTE — Telephone Encounter (Signed)
Preadmission screen  

## 2017-02-07 NOTE — Lactation Note (Signed)
This note was copied from a baby's chart. Lactation Consultation Note: Mother is exclusively pumping. She state's that she pumped 30 mls,  but reports that she was unable to pump any milk the last time. Assist mother with hand expression. Observed drops from the (R) breast. Mother was given a harmony hand pump with instructions. Mother is not active with WIC. She state's that she want's to rent a Symphony pump. [facility]'s [facility]  [facility] and they are  too strong. Symphony was rented to mother under 2 [facility].  Reviewed collection and storage guidelines with mother from Mother and Pecola LeisureBaby book on page [phone]. Mother denies having more questions. Mother is aware of available Lactation services.   Patient Name: Leah Walls UJWJX'BToday's Date: 02/07/2017 Reason for consult: Follow-up assessment   Maternal Data    Feeding Feeding Type: Formula Nipple Type: Regular  LATCH Score/Interventions                      Lactation Tools Discussed/Used     Consult Status Consult Status: Complete    Michel BickersKendrick, Lindee Leason McCoy 02/07/2017, 11:12 AM

## 2017-02-08 NOTE — Progress Notes (Signed)
  CLINICAL SOCIAL WORK MATERNAL/CHILD NOTE  Patient Details  Name: Leah Walls MRN: [id number] Date of Birth: 02/06/2017  Date:  02/08/2017  Clinical Social Worker Initiating Note:  Terri Piedra, [place] Date/ Time Initiated:  02/08/17/1000     Child's Name:  Leah Walls   Legal Guardian:  Other (Comment) (Leah Walls and Leah Walls)   Need for Interpreter:  None   Date of Referral:  02/07/17     Reason for Referral:  Current Substance Use/Substance Use During Pregnancy  (Baby positive UDS for [facility])   Referral Source:  [facility]   Address:  [address] [place], [place] [address]  Phone number:  [phone]   Household Members:  Minor Children, Significant Other (MOB has 5 children at home, ages 60 (Leah), 45 (Leah), 10 (Leah), 7 (girl) and 1.5 (girl).)   Natural Supports (not living in the home):  Immediate Family (MOB reports that FOB is involved and supportive, as are her mother and sister, who are currently caring for the children while she is in the hospital.)   Professional Supports: None   Employment:     Type of Work: 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  MOB's facesheet notes religion as [place].  Strengths:  Ability to meet basic needs , Pediatrician chosen  [facility])   Risk Factors/Current Problems:  Substance Use    Cognitive State:  Alert , Able to Concentrate , Linear Thinking    Mood/Affect:  Calm , Relaxed    CSW Assessment: CSW met with MOB in her first floor room/135 to offer support and complete assessment due to marijuana use in pregnancy.  CSW informed MOB that baby's UDS was positive for THC.  She admits to smoking "occasionally to eat."  She states she has hx of Child Protective Services involvement, but denies involvement currently.  CSW  explained hospital drug screen policy, to which MOB was aware and understanding.  She also understands that CSW is mandated to make a report to CPS due to baby's positive screen.  MOB denies all other substance use.  CSW will monitor CDS results.   MOB reports having good support through FOB, her mother and sister.  She states she has most supplies for baby, but is "planning to get a bassinet."  She then said, "I'm not gonna lie.  My babies sleep in bed with me."  CSW reviewed SIDS precautions, which MOB states she is aware of.  CSW informed MOB that sleeping in bed with an infant is a SIDS risk and not recommended.  CSW informed her that CSW will contact the Health Department to request a co-sleeper be brought to MOB's home.  CSW explained way to make appropriate bed for baby out of a drawer or laundry basket in the meantime.  MOB stated understanding. CSW inquired about mental health and provided education regarding PMADs.  MOB denies any hx of mental illness.   CSW identifies no barriers to discharge.  CPS will follow up in the home regarding the positive UDS for THC.  CSW notified bedside RN.  CSW Plan/Description:  Child Protective Service Report , Patient/Family Education , No Further Intervention Required/No Barriers to Discharge    Alphonzo Cruise, [place] 02/08/2017, 2:09 PM

## 2017-02-09 ENCOUNTER — Other Ambulatory Visit: Payer: Self-pay | Admitting: Obstetrics & Gynecology

## 2017-02-10 ENCOUNTER — Inpatient Hospital Stay (HOSPITAL_COMMUNITY): Admission: RE | Admit: 2017-02-10 | Payer: Medicaid Other | Source: Ambulatory Visit

## 2017-02-12 ENCOUNTER — Telehealth: Payer: Self-pay

## 2017-02-12 NOTE — Telephone Encounter (Signed)
Patient FMLA has been completed.

## 2017-02-19 ENCOUNTER — Telehealth: Payer: Self-pay

## 2017-02-19 NOTE — Telephone Encounter (Signed)
FMLA PAPER WORK WAS COMPLETED.

## 2017-03-20 ENCOUNTER — Encounter: Payer: Self-pay | Admitting: Obstetrics and Gynecology

## 2017-03-20 ENCOUNTER — Encounter: Payer: Self-pay | Admitting: Family Medicine

## 2017-03-20 ENCOUNTER — Ambulatory Visit (INDEPENDENT_AMBULATORY_CARE_PROVIDER_SITE_OTHER): Payer: Medicaid Other | Admitting: Obstetrics and Gynecology

## 2017-03-20 LAB — POCT PREGNANCY, URINE: Preg Test, Ur: NEGATIVE

## 2017-03-20 NOTE — Progress Notes (Signed)
Subjective:     Leah Walls is a 31 y.o. female who presents for a postpartum visit. She is 6 weeks postpartum following a spontaneous vaginal delivery. I have fully reviewed the prenatal and intrapartum course. The delivery was at 40 gestational weeks. Outcome: spontaneous vaginal delivery. Anesthesia: epidural. Postpartum course has been uncomplicated.  Baby's course has been uncomplicated. Baby is feeding by breast. Bleeding no bleeding. Bowel function is normal. Bladder function is normal. Patient is sexually active. Contraception method is Nexplanon. Postpartum depression screening: negative.  Patient states she had unprotected sex 1 week ago. She desires nexplanon today.   The following portions of the patient's history were reviewed and updated as appropriate: allergies, current medications, past family history, past medical history, past social history, past surgical history and problem list.  Review of Systems Pertinent items are noted in HPI.  Objective:    BP (!) 119/100   Pulse 74   Ht  (1.499 m)   Wt 134 lb 14.4 oz (61.2 kg)   Breastfeeding? No   BMI 27.25 kg/m   General:  alert  Lungs: clear to auscultation bilaterally  Heart:  regular rate and rhythm, S1, S2 normal, no murmur, click, rub or gallop  Abdomen: soft, non-tender; bowel sounds normal; no masses,  no organomegaly        Assessment:   Normal postpartum exam. Pap smear not done at today's visit. Last Pap was 07/2016 and it was normal.   Plan:   1. Contraception: none. Recent un protected sex.  2. Patient to return in 2 weeks for pregnancy test and nexplanon placement.    Duane Lope, NP 03/20/2017

## 2017-04-04 ENCOUNTER — Ambulatory Visit: Payer: Self-pay | Admitting: Obstetrics and Gynecology

## 2017-04-05 ENCOUNTER — Encounter: Payer: Self-pay | Admitting: Obstetrics and Gynecology

## 2017-04-05 NOTE — Progress Notes (Signed)
Patient did not keep GYN appointment for 04/04/2017.  Illona Bulman, Jr MD [facility]ending [facility] (Faculty Practice)   

## 2017-12-18 NOTE — L&D Delivery Note (Addendum)
Patient is 32 y.o. [id number] [redacted]w[redacted]d admitted IOL for A2GDM. S/p IOL with Pitocin. AROM at 1800.  Prenatal course also complicated by A2 GDM, h/o VBAC x5, grand multip, marijuana use, genital herpes (on valtrex suppression), UTI during pregnancy .  Delivery Note At 6:14 PM a viable female was delivered via VBAC, Spontaneous (Presentation: LOA  ).  APGAR: 7, 8; weight pending   Placenta status: spontaneous, intact .  Cord: 3 vessel with the following complications: loose nuchal x1 .  Cord pH: not drawn   Anesthesia:  epidural Episiotomy:  none Lacerations:  none Suture Repair: none Est. Blood Loss (mL): 100  Head delivered LOA. Loose nuchal cord present, delivered through. Shoulder and body delivered in usual fashion delivered through nuchal. Infant with spontaneous cry, placed on mother's abdomen, dried and bulb suctioned. Cord clamped x 2 after 1-minute delay, and cut by family member. Cord blood drawn. Placenta delivered spontaneously with gentle cord traction. Fundus firm with massage and Pitocin. Perineum inspected and found to have no laceration.    Mom to postpartum.  Baby to Couplet care / Skin to Skin.  Oralia Manis, DO PGY-2 10/29/2018, 6:24 PM  OB FELLOW DELIVERY ATTESTATION  I was gloved and present for the delivery in its entirety, and I agree with the above resident's note.    Marcy Siren, D.O. OB Fellow  10/29/2018, 8:56 PM

## 2018-04-01 ENCOUNTER — Inpatient Hospital Stay (HOSPITAL_COMMUNITY): Payer: Medicaid Other

## 2018-04-01 ENCOUNTER — Encounter (HOSPITAL_COMMUNITY): Payer: Self-pay | Admitting: *Deleted

## 2018-04-01 ENCOUNTER — Inpatient Hospital Stay (HOSPITAL_COMMUNITY)
Admission: AD | Admit: 2018-04-01 | Discharge: 2018-04-01 | Disposition: A | Discharge status: Home / Self Care | Payer: Medicaid Other | Source: Ambulatory Visit | Attending: Obstetrics & Gynecology | Admitting: Obstetrics & Gynecology

## 2018-04-01 DIAGNOSIS — Z3A08 8 weeks gestation of pregnancy: Secondary | ICD-10-CM | POA: Diagnosis not present

## 2018-04-01 DIAGNOSIS — M549 Dorsalgia, unspecified: Secondary | ICD-10-CM | POA: Diagnosis not present

## 2018-04-01 DIAGNOSIS — Z3491 Encounter for supervision of normal pregnancy, unspecified, first trimester: Secondary | ICD-10-CM

## 2018-04-01 DIAGNOSIS — Z3687 Encounter for antenatal screening for uncertain dates: Secondary | ICD-10-CM | POA: Diagnosis present

## 2018-04-01 DIAGNOSIS — Z87891 Personal history of nicotine dependence: Secondary | ICD-10-CM | POA: Insufficient documentation

## 2018-04-01 DIAGNOSIS — R102 Pelvic and perineal pain: Secondary | ICD-10-CM

## 2018-04-01 DIAGNOSIS — R109 Unspecified abdominal pain: Secondary | ICD-10-CM | POA: Diagnosis present

## 2018-04-01 DIAGNOSIS — O26899 Other specified pregnancy related conditions, unspecified trimester: Secondary | ICD-10-CM | POA: Diagnosis present

## 2018-04-01 DIAGNOSIS — O26891 Other specified pregnancy related conditions, first trimester: Secondary | ICD-10-CM | POA: Diagnosis not present

## 2018-04-01 DIAGNOSIS — R5383 Other fatigue: Secondary | ICD-10-CM | POA: Diagnosis not present

## 2018-04-01 LAB — URINALYSIS, ROUTINE W REFLEX MICROSCOPIC
Bilirubin Urine: NEGATIVE
Glucose, UA: NEGATIVE mg/dL
Ketones, ur: NEGATIVE mg/dL
Nitrite: NEGATIVE
Protein, ur: 100 mg/dL — AB
Specific Gravity, Urine: 1.014 (ref 1.005–1.030)
pH: 6 (ref 5.0–8.0)

## 2018-04-01 LAB — CBC
HCT: 35.7 % — ABNORMAL LOW (ref 36.0–46.0)
Hemoglobin: 11.9 g/dL — ABNORMAL LOW (ref 12.0–15.0)
MCH: 29.8 pg (ref 26.0–34.0)
MCHC: 33.3 g/dL (ref 30.0–36.0)
MCV: 89.5 fL (ref 78.0–100.0)
Platelets: 294 10*3/uL (ref 150–400)
RBC: 3.99 MIL/uL (ref 3.87–5.11)
RDW: 13.3 % (ref 11.5–15.5)
WBC: 12.2 10*3/uL — ABNORMAL HIGH (ref 4.0–10.5)

## 2018-04-01 LAB — WET PREP, GENITAL
Sperm: NONE SEEN
Trich, Wet Prep: NONE SEEN
Yeast Wet Prep HPF POC: NONE SEEN

## 2018-04-01 LAB — POCT PREGNANCY, URINE: Preg Test, Ur: POSITIVE — AB

## 2018-04-01 LAB — HCG, QUANTITATIVE, PREGNANCY: hCG, Beta Chain, Quant, S: 100488 m[IU]/mL — ABNORMAL HIGH (ref <5)

## 2018-04-01 NOTE — MAU Note (Signed)
Pt C/O irregular periods, unsure if pregnant.  LMP was end of March, having lower abd & back pain.  Has been feeling weak & faint.

## 2018-04-01 NOTE — MAU Provider Note (Signed)
History     CSN: [id number]  Arrival date and time: 04/01/18 1411   First Provider Initiated Contact with Patient 04/01/18 2155      Chief Complaint  Patient presents with  . Abdominal Pain  . Back Pain  . Fatigue   HPI Leah Walls is a 32 y.o. [id number] at 5174w6d who presents with lower back and lower abdominal pain. She is unsure if she is pregnant. She describes the pain as cramping and rates the pain a 5/10. She has not tried any medication for the pain. She denies any vaginal bleeding or discharge.   OB History    Gravida  7   Para  6   Term  5   Preterm  1   AB      Living  5     SAB      TAB      Ectopic      Multiple  0   Live Births  4           Past Medical History:  Diagnosis Date  . Headache   . Hernia, abdominal 2011    Past Surgical History:  Procedure Laterality Date  . CESAREAN SECTION    . DILATION AND EVACUATION Bilateral 07/14/2015   Procedure: DILATATION AND EVACUATION;  Surgeon: Kathreen CosierBernard A Marshall, MD;  Location: [facility];  Service: Gynecology;  Laterality: Bilateral;  . FOREARM FRACTURE SURGERY Right 2008  . WISDOM TOOTH EXTRACTION      Family History  Problem Relation Age of Onset  . Hypertension Mother   . Hypertension Maternal Grandmother   . Hypertension Paternal Grandmother     Social History   Tobacco Use  . Smoking status: Former Smoker    Types: Cigarettes  . Smokeless tobacco: Never Used  Substance Use Topics  . Alcohol use: No  . Drug use: No    Allergies: No Known Allergies  Medications Prior to Admission  Medication Sig Dispense Refill Last Dose  . acetaminophen (TYLENOL) 500 MG tablet Take 500 mg by mouth every 6 (six) hours as needed for moderate pain.    Taking  . valACYclovir (VALTREX) 500 MG tablet Take 1 tablet (500 mg total) by mouth 2 (two) times daily. 30 tablet 1 Taking    Review of Systems  Constitutional: Negative.  Negative for fatigue and fever.  HENT: Negative.   Respiratory:  Negative.  Negative for shortness of breath.   Cardiovascular: Negative.  Negative for chest pain.  Gastrointestinal: Positive for abdominal pain. Negative for constipation, diarrhea, nausea and vomiting.  Genitourinary: Negative.  Negative for dysuria, vaginal bleeding and vaginal discharge.  Neurological: Negative.  Negative for dizziness and headaches.   Physical Exam   Blood pressure 117/61, pulse 98, temperature 98.2 F (36.8 C), temperature source Oral, resp. rate 16, height 4\' 11"  (1.499 m), weight 130 lb 1.9 oz (59 kg), last menstrual period 03/16/2018, not currently breastfeeding.  Physical Exam  Nursing note and vitals reviewed. Constitutional: She is oriented to person, place, and time. She appears well-developed and well-nourished. No distress.  HENT:  Head: Normocephalic.  Eyes: Pupils are equal, round, and reactive to light.  Cardiovascular: Normal rate, regular rhythm and normal heart sounds.  Respiratory: Effort normal and breath sounds normal. No respiratory distress.  GI: Soft. Bowel sounds are normal. She exhibits no distension. There is no tenderness.  Neurological: She is alert and oriented to person, place, and time.  Skin: Skin is warm and dry.  Psychiatric: She  has a normal mood and affect. Her behavior is normal. Judgment and thought content normal.    MAU Course  Procedures Results for orders placed or performed during the hospital encounter of 04/01/18 (from the past 24 hour(s))  Urinalysis, Routine w reflex microscopic     Status: Abnormal   Collection Time: 04/01/18  2:30 PM  Result Value Ref Range   Color, Urine YELLOW YELLOW   APPearance CLOUDY (A) CLEAR   Specific Gravity, Urine 1.014 1.005 - 1.030   pH 6.0 5.0 - 8.0   Glucose, UA NEGATIVE NEGATIVE mg/dL   Hgb urine dipstick SMALL (A) NEGATIVE   Bilirubin Urine NEGATIVE NEGATIVE   Ketones, ur NEGATIVE NEGATIVE mg/dL   Protein, ur 130 (A) NEGATIVE mg/dL   Nitrite NEGATIVE NEGATIVE   Leukocytes,  UA LARGE (A) NEGATIVE   RBC / HPF 6-30 0 - 5 RBC/hpf   WBC, UA TOO NUMEROUS TO COUNT 0 - 5 WBC/hpf   Bacteria, UA MANY (A) NONE SEEN   Squamous Epithelial / LPF 0-5 (A) NONE SEEN   WBC Clumps PRESENT    Mucus PRESENT   Pregnancy, urine POC     Status: Abnormal   Collection Time: 04/01/18  2:41 PM  Result Value Ref Range   Preg Test, Ur POSITIVE (A) NEGATIVE  CBC     Status: Abnormal   Collection Time: 04/01/18  6:24 PM  Result Value Ref Range   WBC 12.2 (H) 4.0 - 10.5 K/uL   RBC 3.99 3.87 - 5.11 MIL/uL   Hemoglobin 11.9 (L) 12.0 - 15.0 g/dL   HCT 86.5 (L) 78.4 - 69.6 %   MCV 89.5 78.0 - 100.0 fL   MCH 29.8 26.0 - 34.0 pg   MCHC 33.3 30.0 - 36.0 g/dL   RDW 29.5 28.4 - 13.2 %   Platelets 294 150 - 400 K/uL  hCG, quantitative, pregnancy     Status: Abnormal   Collection Time: 04/01/18  6:24 PM  Result Value Ref Range   hCG, Beta Chain, Quant, S 100,488 (H) <5 mIU/mL  Wet prep, genital     Status: Abnormal   Collection Time: 04/01/18 10:00 PM  Result Value Ref Range   Yeast Wet Prep HPF POC NONE SEEN NONE SEEN   Trich, Wet Prep NONE SEEN NONE SEEN   Clue Cells Wet Prep HPF POC PRESENT (A) NONE SEEN   WBC, Wet Prep HPF POC FEW (A) NONE SEEN   Sperm NONE SEEN    US Ob Comp Less 14 Wks  Result Date: 04/01/2018 CLINICAL DATA:  Pelvic pain irregular cycles with positive urine pregnancy test EXAM: OBSTETRIC <14 WK ULTRASOUND TECHNIQUE: Transabdominal ultrasound was performed for evaluation of the gestation as well as the maternal uterus and adnexal regions. COMPARISON:  None. FINDINGS: Intrauterine gestational sac: Single intrauterine gestation Yolk sac:  Visible Embryo:  Visible Cardiac Activity: Visible Heart Rate: 159 bpm CRL:   22.9 mm   8 w 6 d                  [place] EDC: 11/05/2018 Subchorionic hemorrhage:  None visualized. Maternal uterus/adnexae: Ovaries are within normal limits. The right ovary measures 2.7 x 1.6 x 1.8 cm. Left ovary measures 2.6 by 4.3 x 2.6 cm and contains  prominent follicles. No significant free fluid. IMPRESSION: Single viable intrauterine pregnancy as above. There are no other abnormalities visualized. Electronically Signed   By: Jasmine Pang M.D.   On: 04/01/2018 20:09   MDM UA, UPT CBC,  HCG, ABO/Rh Wet prep and gc/chlamydia US OB Transvaginal US OB Comp Less 14 weeks  Assessment and Plan   1. Normal intrauterine pregnancy on prenatal ultrasound in first trimester   2. Pelvic pain affecting pregnancy   3. [redacted] weeks gestation of pregnancy    -Discharge home in stable condition -First trimester precautions discussed -Patient advised to follow-up with OB/GYN of choice to start prenatal care -Patient may return to MAU as needed or if her condition were to change or worsen  Rolm Bookbinder CNM 04/01/2018, 9:58 PM

## 2018-04-01 NOTE — Discharge Instructions (Signed)
Safe Medications in Pregnancy  ° °Acne: °Benzoyl Peroxide °Salicylic Acid ° °Backache/Headache: °Tylenol: 2 regular strength every 4 hours OR °             2 Extra strength every 6 hours ° °Colds/Coughs/Allergies: °Benadryl (alcohol free) 25 mg every 6 hours as needed °Breath right strips °Claritin °Cepacol throat lozenges °Chloraseptic throat spray °Cold-Eeze- up to three times per day °Cough drops, alcohol free °Flonase (by prescription only) °Guaifenesin °Mucinex °Robitussin DM (plain only, alcohol free) °Saline nasal spray/drops °Sudafed (pseudoephedrine) & Actifed ** use only after [redacted] weeks gestation and if you do not have high blood pressure °Tylenol °Vicks Vaporub °Zinc lozenges °Zyrtec  ° °Constipation: °Colace °Ducolax suppositories °Fleet enema °Glycerin suppositories °Metamucil °Milk of magnesia °Miralax °Senokot °Smooth move tea ° °Diarrhea: °Kaopectate °Imodium A-D ° °*NO pepto Bismol ° °Hemorrhoids: °Anusol °Anusol HC °Preparation H °Tucks ° °Indigestion: °Tums °Maalox °Mylanta °Zantac  °Pepcid ° °Insomnia: °Benadryl (alcohol free) 25mg every 6 hours as needed °Tylenol PM °Unisom, no Gelcaps ° °Leg Cramps: °Tums °MagGel ° °Nausea/Vomiting:  °Bonine °Dramamine °Emetrol °Ginger extract °Sea bands °Meclizine  °Nausea medication to take during pregnancy:  °Unisom (doxylamine succinate 25 mg tablets) Take one tablet daily at bedtime. If symptoms are not adequately controlled, the dose can be increased to a maximum recommended dose of two tablets daily (1/2 tablet in the morning, 1/2 tablet mid-afternoon and one at bedtime). °Vitamin B6 100mg tablets. Take one tablet twice a day (up to 200 mg per day). ° °Skin Rashes: °Aveeno products °Benadryl cream or 25mg every 6 hours as needed °Calamine Lotion °1% cortisone cream ° °Yeast infection: °Gyne-lotrimin 7 °Monistat 7 ° ° °**If taking multiple medications, please check labels to avoid duplicating the same active ingredients °**take medication as directed on  the label °** Do not exceed 4000 mg of tylenol in 24 hours °**Do not take medications that contain aspirin or ibuprofen ° ° ° °[facility]  ° ° °[facility]       Phone: [phone] ° °[facility] [place] Phone: [phone] ° °[facility] [place]  Phone: [phone] ° °[facility] [place]  Phone: [phone] ° °[facility] [place]  Phone: [phone] ° °[facility]       Phone: [phone] ° °[facility]    Phone: [phone]  ° °[facility]    Phone: [phone] ° °[facility]    Phone: [phone] ° °[facility]    Phone: [phone] ° °[facility]    Phone: [phone] ° °[facility]       Phone: [phone]  ° °[facility]  Phone: [phone] ° °[facility]    Phone: [phone] ° °[facility]   Phone: [phone] ° °[facility]      Phone: [phone] ° °[facility]    Phone: [phone] ° ° °

## 2018-04-02 LAB — HIV ANTIBODY (ROUTINE TESTING W REFLEX): HIV Screen 4th Generation wRfx: NONREACTIVE

## 2018-04-02 LAB — GC/CHLAMYDIA PROBE AMP (~~LOC~~) NOT AT ARMC
Chlamydia: NEGATIVE
Neisseria Gonorrhea: NEGATIVE

## 2018-04-04 LAB — CULTURE, OB URINE: Culture: >=100000 — AB

## 2018-04-05 ENCOUNTER — Other Ambulatory Visit: Payer: Self-pay | Admitting: Family Medicine

## 2018-04-05 ENCOUNTER — Other Ambulatory Visit: Payer: Self-pay | Admitting: Obstetrics and Gynecology

## 2018-04-05 ENCOUNTER — Encounter: Payer: Self-pay | Admitting: Obstetrics and Gynecology

## 2018-04-05 DIAGNOSIS — O0990 Supervision of high risk pregnancy, unspecified, unspecified trimester: Secondary | ICD-10-CM

## 2018-04-05 DIAGNOSIS — O2340 Unspecified infection of urinary tract in pregnancy, unspecified trimester: Secondary | ICD-10-CM | POA: Insufficient documentation

## 2018-04-05 DIAGNOSIS — O2341 Unspecified infection of urinary tract in pregnancy, first trimester: Secondary | ICD-10-CM

## 2018-04-05 DIAGNOSIS — O210 Mild hyperemesis gravidarum: Secondary | ICD-10-CM

## 2018-04-05 MED ORDER — PROMETHAZINE HCL 12.5 MG PO TABS: 12.5000 mg | ORAL_TABLET | Freq: Four times a day (QID) | ORAL | 0 refills | Status: DC | PRN

## 2018-04-05 MED ORDER — CEPHALEXIN 500 MG PO CAPS: 500.0000 mg | ORAL_CAPSULE | Freq: Four times a day (QID) | ORAL | 0 refills | Status: DC

## 2018-04-05 MED ORDER — PRENATAL ADULT GUMMY/DHA/FA 0.4-25 MG PO CHEW: 3.0000 | CHEWABLE_TABLET | Freq: Every day | ORAL | 12 refills | Status: DC

## 2018-04-05 MED ORDER — PRENATAL ADULT GUMMY/DHA/FA 0.4-25 MG PO CHEW: 1.0000 | CHEWABLE_TABLET | Freq: Every day | ORAL | 12 refills | Status: DC

## 2018-04-05 NOTE — Progress Notes (Signed)
Pt notified of (+) UCx, Rx for Keflex sent to pharmacy of choice. Pt requests Rx for nausea and chewable PNV - also sent to pharmacy of choice.  Raelyn Moraolitta Lyliana Dicenso MSN, CNM 04/05/2018 9:49 AM

## 2018-04-11 ENCOUNTER — Encounter: Payer: Self-pay | Admitting: *Deleted

## 2018-04-16 ENCOUNTER — Encounter: Payer: Self-pay | Admitting: *Deleted

## 2018-04-17 ENCOUNTER — Telehealth: Payer: Self-pay | Admitting: Obstetrics and Gynecology

## 2018-04-17 DIAGNOSIS — Z3480 Encounter for supervision of other normal pregnancy, unspecified trimester: Secondary | ICD-10-CM

## 2018-04-17 DIAGNOSIS — K219 Gastro-esophageal reflux disease without esophagitis: Secondary | ICD-10-CM

## 2018-04-17 NOTE — Telephone Encounter (Signed)
Patient is requesting a RX for Prenatal Vitamins   [facility] on [address]

## 2018-04-22 MED ORDER — OMEPRAZOLE 40 MG PO CPDR
40.0000 mg | DELAYED_RELEASE_CAPSULE | Freq: Every day | ORAL | 1 refills | Status: DC
Start: 2018-04-22 — End: 2018-07-05

## 2018-04-22 MED ORDER — PRENATE PIXIE 10-0.6-0.4-200 MG PO CAPS: 1.0000 | ORAL_CAPSULE | Freq: Every day | ORAL | 7 refills | Status: DC

## 2018-04-22 NOTE — Telephone Encounter (Addendum)
Magie called front desk and asked to speak with Nurse. States she called last week and no one is calling her back. Wants to get PNV called in - wants to take one that she has taken before called [facility]ie- states is is a tiny gel pill. Also wants prilosec refilled - states having awful reflux.  Discussed with provider. Called pharmacy to clarify name of PNV and order placed.

## 2018-05-17 ENCOUNTER — Encounter: Payer: Self-pay | Admitting: Obstetrics and Gynecology

## 2018-05-17 ENCOUNTER — Ambulatory Visit (INDEPENDENT_AMBULATORY_CARE_PROVIDER_SITE_OTHER): Payer: Medicaid Other | Admitting: Obstetrics and Gynecology

## 2018-05-17 VITALS — BP 107/58 | HR 79 | Wt 135.3 lb

## 2018-05-17 DIAGNOSIS — O98319 Other infections with a predominantly sexual mode of transmission complicating pregnancy, unspecified trimester: Secondary | ICD-10-CM

## 2018-05-17 DIAGNOSIS — O0940 Supervision of pregnancy with grand multiparity, unspecified trimester: Secondary | ICD-10-CM

## 2018-05-17 DIAGNOSIS — Z3480 Encounter for supervision of other normal pregnancy, unspecified trimester: Secondary | ICD-10-CM

## 2018-05-17 DIAGNOSIS — O0990 Supervision of high risk pregnancy, unspecified, unspecified trimester: Secondary | ICD-10-CM | POA: Insufficient documentation

## 2018-05-17 DIAGNOSIS — Z3482 Encounter for supervision of other normal pregnancy, second trimester: Secondary | ICD-10-CM

## 2018-05-17 DIAGNOSIS — A6009 Herpesviral infection of other urogenital tract: Secondary | ICD-10-CM

## 2018-05-17 DIAGNOSIS — Z98891 History of uterine scar from previous surgery: Secondary | ICD-10-CM

## 2018-05-17 DIAGNOSIS — O98312 Other infections with a predominantly sexual mode of transmission complicating pregnancy, second trimester: Secondary | ICD-10-CM

## 2018-05-17 DIAGNOSIS — O0993 Supervision of high risk pregnancy, unspecified, third trimester: Secondary | ICD-10-CM | POA: Insufficient documentation

## 2018-05-17 DIAGNOSIS — N6312 Unspecified lump in the right breast, upper inner quadrant: Secondary | ICD-10-CM

## 2018-05-17 DIAGNOSIS — O0942 Supervision of pregnancy with grand multiparity, second trimester: Secondary | ICD-10-CM

## 2018-05-17 DIAGNOSIS — O09892 Supervision of other high risk pregnancies, second trimester: Secondary | ICD-10-CM

## 2018-05-17 DIAGNOSIS — O09899 Supervision of other high risk pregnancies, unspecified trimester: Secondary | ICD-10-CM

## 2018-05-17 HISTORY — DX: Encounter for supervision of other normal pregnancy, unspecified trimester: Z34.80

## 2018-05-17 LAB — POCT URINALYSIS DIP (DEVICE)
Bilirubin Urine: NEGATIVE
Glucose, UA: NEGATIVE mg/dL
Ketones, ur: NEGATIVE mg/dL
Leukocytes, UA: NEGATIVE
Nitrite: NEGATIVE
Protein, ur: 100 mg/dL — AB
Specific Gravity, Urine: >=1.03 (ref 1.005–1.030)
Urobilinogen, UA: 0.2 mg/dL (ref 0.0–1.0)
pH: 6 (ref 5.0–8.0)

## 2018-05-17 MED ORDER — DOCUSATE SODIUM 100 MG PO CAPS: 100.0000 mg | ORAL_CAPSULE | Freq: Two times a day (BID) | ORAL | 2 refills | Status: DC | PRN

## 2018-05-17 NOTE — Progress Notes (Signed)
Subjective:  Leah Walls is a 32 y.o. Z6X0960 at [redacted]w[redacted]d being seen today for her first OB visit. EDD  11/05/18 by first trimester U/S. H/O Vbac x 4. H/O HSV, last outbreak over a year ago.H/O right breast adenoma,did not have f/u U/S.   She is currently monitored for the following issues for this high-risk pregnancy and has Short interval between pregnancies affecting pregnancy, antepartum; History of VBAC x4; Grand multiparity, antepartum; Breast lump on right side at 1 o'clock position; Marijuana use; Genital herpes affecting pregnancy, antepartum; Umbilical hernia; UTI (urinary tract infection) during pregnancy; and Supervision of other normal pregnancy, antepartum on their problem list.  Patient reports no complaints.  Contractions: Not present. Vag. Bleeding: None.  Movement: Absent. Denies leaking of fluid.   The following portions of the patient's history were reviewed and updated as appropriate: allergies, current medications, past family history, past medical history, past social history, past surgical history and problem list. Problem list updated.  Objective:   Vitals:   05/17/18 1018  BP: (!) 107/58  Pulse: 79  Weight: 135 lb 4.8 oz (61.4 kg)    Fetal Status: Fetal Heart Rate (bpm): 154   Movement: Absent     General:  Alert, oriented and cooperative. Patient is in no acute distress.  Skin: Skin is warm and dry. No rash noted.   Cardiovascular: Normal heart rate noted  Respiratory: Normal respiratory effort, no problems with respiration noted  Abdomen: Soft, gravid, appropriate for gestational age. Pain/Pressure: Present     Pelvic:  Cervical exam deferred        Extremities: Normal range of motion.  Edema: None  Mental Status: Normal mood and affect. Normal behavior. Normal judgment and thought content.  Breast sym supple no nipple discharge or adenopathy, small 1 cm mobile rubbery nodule about 1 cm from right nipple at 1 o'clock position  Urinalysis:      Assessment and  Plan:  Pregnancy: [id number] at [redacted]w[redacted]d  1. Supervision of other normal pregnancy, antepartum Prenatal care and labs reviewed with - Cystic fibrosis gene test - [facility] AMB BABYSCRIPTS OPT IN - Genetic Screening - Hemoglobinopathy Evaluation - Obstetric Panel, Including HIV - SMN1 COPY NUMBER ANALYSIS (SMA Carrier Screen) - [place] MFM OB COMP + 14 WK; Future - POCT urinalysis dip (device) Continue with PNV  Colace for constipation  2. History of VBAC x4 Will need to sign consent at later OB visit  3. Grand multiparity, antepartum Increase risk for PPH  4. Genital herpes affecting pregnancy, antepartum Suppression therapy at 36 weeks  5. Breast lump on right side at 1 o'clock position  - US BREAST ASPIRATION RIGHT; Future  6. Short interval between pregnancies affecting pregnancy, antepartum   Preterm labor symptoms and general obstetric precautions including but not limited to vaginal bleeding, contractions, leaking of fluid and fetal movement were reviewed in detail with the patient. Please refer to After Visit Summary for other counseling recommendations.  Return in about 1 month (around 06/14/2018) for OB visit.   Hermina Staggers, MD

## 2018-05-17 NOTE — Patient Instructions (Signed)

## 2018-05-20 ENCOUNTER — Other Ambulatory Visit: Payer: Self-pay | Admitting: Obstetrics and Gynecology

## 2018-05-20 ENCOUNTER — Telehealth: Payer: Self-pay

## 2018-05-20 DIAGNOSIS — N6312 Unspecified lump in the right breast, upper inner quadrant: Secondary | ICD-10-CM

## 2018-05-20 NOTE — Telephone Encounter (Signed)
Called Pt to make aware of Mammogram appt w/ [facility] on 05/24/18 at 9:20am. No answer when called left VM.

## 2018-05-22 ENCOUNTER — Encounter: Payer: Self-pay | Admitting: *Deleted

## 2018-05-23 LAB — OBSTETRIC PANEL, INCLUDING HIV
Antibody Screen: NEGATIVE
Basophils Absolute: 0 10*3/uL (ref 0.0–0.2)
Basos: 0 %
EOS (ABSOLUTE): 0.1 10*3/uL (ref 0.0–0.4)
Eos: 1 %
HIV Screen 4th Generation wRfx: NONREACTIVE
Hematocrit: 31.4 % — ABNORMAL LOW (ref 34.0–46.6)
Hemoglobin: 10.7 g/dL — ABNORMAL LOW (ref 11.1–15.9)
Hepatitis B Surface Ag: NEGATIVE
Immature Grans (Abs): 0 10*3/uL (ref 0.0–0.1)
Immature Granulocytes: 1 %
Lymphocytes Absolute: 1.5 10*3/uL (ref 0.7–3.1)
Lymphs: 22 %
MCH: 29.6 pg (ref 26.6–33.0)
MCHC: 34.1 g/dL (ref 31.5–35.7)
MCV: 87 fL (ref 79–97)
Monocytes Absolute: 0.5 10*3/uL (ref 0.1–0.9)
Monocytes: 7 %
Neutrophils Absolute: 4.7 10*3/uL (ref 1.4–7.0)
Neutrophils: 69 %
Platelets: 277 10*3/uL (ref 150–450)
RBC: 3.62 x10E6/uL — ABNORMAL LOW (ref 3.77–5.28)
RDW: 14.2 % (ref 12.3–15.4)
RPR Ser Ql: NONREACTIVE
Rh Factor: POSITIVE
Rubella Antibodies, IGG: 2.47 index (ref >0.99)
WBC: 6.7 10*3/uL (ref 3.4–10.8)

## 2018-05-23 LAB — SMN1 COPY NUMBER ANALYSIS (SMA CARRIER SCREENING)

## 2018-05-23 LAB — HEMOGLOBINOPATHY EVALUATION
Ferritin: 46 ng/mL (ref 15–150)
Hgb A2 Quant: 2.3 % (ref 1.8–3.2)
Hgb A: 97.7 % (ref 96.4–98.8)
Hgb C: 0 %
Hgb F Quant: 0 % (ref 0.0–2.0)
Hgb S: 0 %
Hgb Solubility: NEGATIVE
Hgb Variant: 0 %

## 2018-05-23 LAB — CYSTIC FIBROSIS GENE TEST

## 2018-05-24 ENCOUNTER — Other Ambulatory Visit: Payer: Self-pay

## 2018-05-24 ENCOUNTER — Encounter: Payer: Self-pay | Admitting: *Deleted

## 2018-06-05 ENCOUNTER — Ambulatory Visit
Admission: RE | Admit: 2018-06-05 | Discharge: 2018-06-05 | Disposition: A | Discharge status: Home / Self Care | Payer: Medicaid Other | Source: Ambulatory Visit | Attending: Obstetrics and Gynecology | Admitting: Obstetrics and Gynecology

## 2018-06-05 DIAGNOSIS — N6312 Unspecified lump in the right breast, upper inner quadrant: Secondary | ICD-10-CM

## 2018-06-14 ENCOUNTER — Ambulatory Visit (INDEPENDENT_AMBULATORY_CARE_PROVIDER_SITE_OTHER): Payer: Medicaid Other | Admitting: Obstetrics and Gynecology

## 2018-06-14 ENCOUNTER — Other Ambulatory Visit (HOSPITAL_COMMUNITY)
Admission: RE | Admit: 2018-06-14 | Discharge: 2018-06-14 | Disposition: A | Discharge status: Home / Self Care | Payer: Medicaid Other | Source: Ambulatory Visit | Attending: Obstetrics and Gynecology | Admitting: Obstetrics and Gynecology

## 2018-06-14 ENCOUNTER — Encounter: Payer: Self-pay | Admitting: Obstetrics and Gynecology

## 2018-06-14 VITALS — BP 109/55 | HR 88 | Wt 139.8 lb

## 2018-06-14 DIAGNOSIS — O2341 Unspecified infection of urinary tract in pregnancy, first trimester: Secondary | ICD-10-CM

## 2018-06-14 DIAGNOSIS — B9689 Other specified bacterial agents as the cause of diseases classified elsewhere: Secondary | ICD-10-CM | POA: Diagnosis not present

## 2018-06-14 DIAGNOSIS — N898 Other specified noninflammatory disorders of vagina: Secondary | ICD-10-CM | POA: Insufficient documentation

## 2018-06-14 DIAGNOSIS — Z3480 Encounter for supervision of other normal pregnancy, unspecified trimester: Secondary | ICD-10-CM

## 2018-06-14 DIAGNOSIS — Z98891 History of uterine scar from previous surgery: Secondary | ICD-10-CM

## 2018-06-14 DIAGNOSIS — O0940 Supervision of pregnancy with grand multiparity, unspecified trimester: Secondary | ICD-10-CM

## 2018-06-14 DIAGNOSIS — A6009 Herpesviral infection of other urogenital tract: Secondary | ICD-10-CM

## 2018-06-14 DIAGNOSIS — O98319 Other infections with a predominantly sexual mode of transmission complicating pregnancy, unspecified trimester: Secondary | ICD-10-CM

## 2018-06-14 MED ORDER — DOCUSATE SODIUM 100 MG PO CAPS: 100.0000 mg | ORAL_CAPSULE | Freq: Two times a day (BID) | ORAL | 2 refills | Status: DC | PRN

## 2018-06-14 NOTE — Progress Notes (Signed)
Pt states is having itching all over, Benadryl makes her sleepy. Pt also feels that she may have a yeast infection or BV.

## 2018-06-14 NOTE — Assessment & Plan Note (Signed)
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

## 2018-06-14 NOTE — Progress Notes (Signed)
Subjective:  Anders SimmondsSade I Liberman is a 32 y.o. [id number] at 2535w3d being seen today for ongoing prenatal care.  She is currently monitored for the following issues for this high-risk pregnancy and has Short interval between pregnancies affecting pregnancy, antepartum; History of VBAC x4; Grand multiparity, antepartum; Breast lump on right side at 1 o'clock position; Marijuana use; Genital herpes affecting pregnancy, antepartum; Umbilical hernia; UTI (urinary tract infection) during pregnancy; and Supervision of other normal pregnancy, antepartum on their problem list.  Patient reports vaginal discharge.  Contractions: Not present. Vag. Bleeding: None.  Movement: Present. Denies leaking of fluid.   The following portions of the patient's history were reviewed and updated as appropriate: allergies, current medications, past family history, past medical history, past social history, past surgical history and problem list. Problem list updated.  Objective:   Vitals:   06/14/18 1140  BP: (!) 109/55  Pulse: 88  Weight: 139 lb 12.8 oz (63.4 kg)    Fetal Status: Fetal Heart Rate (bpm): 140   Movement: Present     General:  Alert, oriented and cooperative. Patient is in no acute distress.  Skin: Skin is warm and dry. No rash noted.   Cardiovascular: Normal heart rate noted  Respiratory: Normal respiratory effort, no problems with respiration noted  Abdomen: Soft, gravid, appropriate for gestational age. Pain/Pressure: Absent     Pelvic:  Cervical exam deferred        Extremities: Normal range of motion.  Edema: None  Mental Status: Normal mood and affect. Normal behavior. Normal judgment and thought content.   Urinalysis:      Assessment and Plan:  Pregnancy: [id number] at 8135w3d  1. Supervision of other normal pregnancy, antepartum Stable - US MFM OB COMP + 14 WK; Future  2. History of VBAC x4 Will need to sign TOLAC at 28-32 weeks  3. Grand multiparity, antepartum Risk of PPH  4. Genital herpes  affecting pregnancy, antepartum Suppression at 36 weeks  5. Discharge of vagina Self swab today - Cervicovaginal ancillary only  6. Urinary tract infection in mother during first trimester of pregnancy Need Urine culture at next visit  Preterm labor symptoms and general obstetric precautions including but not limited to vaginal bleeding, contractions, leaking of fluid and fetal movement were reviewed in detail with the patient. Please refer to After Visit Summary for other counseling recommendations.  Return in about 1 month (around 07/12/2018) for OB visit.   Hermina StaggersErvin, Zaccary Creech L, MD

## 2018-06-14 NOTE — Patient Instructions (Signed)

## 2018-06-17 LAB — CERVICOVAGINAL ANCILLARY ONLY: Bacterial vaginitis: POSITIVE — AB

## 2018-06-18 ENCOUNTER — Encounter (HOSPITAL_COMMUNITY): Payer: Self-pay

## 2018-06-18 ENCOUNTER — Other Ambulatory Visit: Payer: Self-pay

## 2018-06-18 MED ORDER — METRONIDAZOLE 500 MG PO TABS: 500.0000 mg | ORAL_TABLET | Freq: Two times a day (BID) | ORAL | 0 refills | Status: DC

## 2018-06-18 NOTE — Progress Notes (Signed)
Flagyl sent to pharmacy per Dr Alysia PennaErvin.  Tried to contact patient unable to reach will sent mychart message.

## 2018-06-26 ENCOUNTER — Other Ambulatory Visit: Payer: Self-pay | Admitting: Obstetrics and Gynecology

## 2018-06-26 ENCOUNTER — Ambulatory Visit (HOSPITAL_COMMUNITY)
Admission: RE | Admit: 2018-06-26 | Discharge: 2018-06-26 | Disposition: A | Discharge status: Home / Self Care | Payer: Medicaid Other | Source: Ambulatory Visit | Attending: Obstetrics and Gynecology | Admitting: Obstetrics and Gynecology

## 2018-06-26 DIAGNOSIS — O09213 Supervision of pregnancy with history of pre-term labor, third trimester: Secondary | ICD-10-CM

## 2018-06-26 DIAGNOSIS — Z3689 Encounter for other specified antenatal screening: Secondary | ICD-10-CM

## 2018-06-26 DIAGNOSIS — Z363 Encounter for antenatal screening for malformations: Secondary | ICD-10-CM | POA: Diagnosis not present

## 2018-06-26 DIAGNOSIS — Z3A21 21 weeks gestation of pregnancy: Secondary | ICD-10-CM

## 2018-06-26 DIAGNOSIS — Z3480 Encounter for supervision of other normal pregnancy, unspecified trimester: Secondary | ICD-10-CM

## 2018-07-01 ENCOUNTER — Other Ambulatory Visit (HOSPITAL_COMMUNITY): Payer: Self-pay | Admitting: *Deleted

## 2018-07-01 DIAGNOSIS — Z362 Encounter for other antenatal screening follow-up: Secondary | ICD-10-CM

## 2018-07-05 ENCOUNTER — Other Ambulatory Visit: Payer: Self-pay | Admitting: Obstetrics and Gynecology

## 2018-07-05 ENCOUNTER — Other Ambulatory Visit: Payer: Self-pay | Admitting: Obstetrics & Gynecology

## 2018-07-05 DIAGNOSIS — Z3480 Encounter for supervision of other normal pregnancy, unspecified trimester: Secondary | ICD-10-CM

## 2018-07-05 DIAGNOSIS — K219 Gastro-esophageal reflux disease without esophagitis: Secondary | ICD-10-CM

## 2018-07-08 ENCOUNTER — Telehealth: Payer: Self-pay | Admitting: General Practice

## 2018-07-08 NOTE — Telephone Encounter (Signed)
Patient called and left message on nurse voicemail line requesting a refill on heartburn medication. Called patient & informed her of refill. Patient verbalized understanding & had no questions.

## 2018-07-16 ENCOUNTER — Other Ambulatory Visit (HOSPITAL_COMMUNITY)
Admission: RE | Admit: 2018-07-16 | Discharge: 2018-07-16 | Disposition: A | Discharge status: Home / Self Care | Payer: Medicaid Other | Source: Ambulatory Visit | Attending: Student | Admitting: Student

## 2018-07-16 ENCOUNTER — Ambulatory Visit (INDEPENDENT_AMBULATORY_CARE_PROVIDER_SITE_OTHER): Payer: Medicaid Other | Admitting: Student

## 2018-07-16 VITALS — BP 99/60 | HR 90 | Wt 144.0 lb

## 2018-07-16 DIAGNOSIS — N898 Other specified noninflammatory disorders of vagina: Secondary | ICD-10-CM

## 2018-07-16 DIAGNOSIS — Z3480 Encounter for supervision of other normal pregnancy, unspecified trimester: Secondary | ICD-10-CM

## 2018-07-16 DIAGNOSIS — O2341 Unspecified infection of urinary tract in pregnancy, first trimester: Secondary | ICD-10-CM

## 2018-07-16 LAB — POCT URINALYSIS DIP (DEVICE)
Bilirubin Urine: NEGATIVE
Glucose, UA: NEGATIVE mg/dL
Ketones, ur: NEGATIVE mg/dL
Nitrite: NEGATIVE
Protein, ur: NEGATIVE mg/dL
Specific Gravity, Urine: 1.02 (ref 1.005–1.030)
Urobilinogen, UA: 0.2 mg/dL (ref 0.0–1.0)
pH: 6 (ref 5.0–8.0)

## 2018-07-16 MED ORDER — TERCONAZOLE 0.8 % VA CREA: 1.0000 | TOPICAL_CREAM | Freq: Every day | VAGINAL | 0 refills | Status: DC

## 2018-07-16 NOTE — Progress Notes (Signed)
Patient ID: Leah Walls, female   DOB: July 14, 1986, 32 y.o.   MRN: [id number]     PRENATAL VISIT NOTE  Subjective:  Leah Walls is a 32 y.o. [id number] at 6455w0d being seen today for ongoing prenatal care.  She is currently monitored for the following issues for this low-risk pregnancy and has History of VBAC x4; Grand multiparity, antepartum; Breast lump on right side at 1 o'clock position; Marijuana use; Genital herpes affecting pregnancy, antepartum; Umbilical hernia; UTI (urinary tract infection) during pregnancy; and Supervision of other normal pregnancy, antepartum on their problem list.  Patient reports   Contractions: Not present. Vag. Bleeding: None.  Movement: Present. Denies leaking of fluid.   The following portions of the patient's history were reviewed and updated as appropriate: allergies, current medications, past family history, past medical history, past social history, past surgical history and problem list. Problem list updated.  Objective:   Vitals:   07/16/18 1101  BP: 99/60  Pulse: 90  Weight: 144 lb (65.3 kg)    Fetal Status: Fetal Heart Rate (bpm): 152   Movement: Present     General:  Alert, oriented and cooperative. Patient is in no acute distress.  Skin: Skin is warm and dry. No rash noted.   Cardiovascular: Normal heart rate noted  Respiratory: Normal respiratory effort, no problems with respiration noted  Abdomen: Soft, gravid, appropriate for gestational age.  Pain/Pressure: Present     Pelvic: Cervical exam deferred       Scarce white clumpy discharge in the vagina.   Extremities: Normal range of motion.  Edema: None  Mental Status: Normal mood and affect. Normal behavior. Normal judgment and thought content.   Assessment and Plan:  Pregnancy: G7P5105 at 7255w0d  1. Discharge of vagina -Wants RX for Terazole 3; ordered.  - Cervicovaginal ancillary only  2. Urinary tract infection in mother during first trimester of pregnancy  - Culture, OB  Urine  3. Supervision of other normal pregnancy, antepartum -Patient refusing two hour GTT next  Vist; she thinks that the drink is toxic. She agrees to bring her own [employer] Jelly Beans and we will have her eat those instead.    Preterm labor symptoms and general obstetric precautions including but not limited to vaginal bleeding, contractions, leaking of fluid and fetal movement were reviewed in detail with the patient. Please refer to After Visit Summary for other counseling recommendations.  Return in about 4 years (around 07/16/2022), or LROB and jelly bean test.  Future Appointments  Date Time Provider Department Center  07/24/2018  1:15 PM [facility] US 4 [facility]  08/13/2018  8:20 AM [facility] LAB [facility]  08/13/2018  9:15 AM Marny LowensteinWenzel, Julie N, PA-C [facility]    Marylene LandKathryn Lorraine Kayah Hecker, [place]

## 2018-07-16 NOTE — Patient Instructions (Signed)
Glucose Tolerance Test The glucose tolerance test (GTT) is one of several tests used to diagnose diabetes mellitus. The GTT is a blood test, and it may include a urine test as well. The GTT checks to see how your body processes sugar (glucose). For this test, you will consume a drink containing a high level of glucose. Your blood glucose levels will be checked before you consume the drink and then again 1, 2, 3, and possibly 4 hours after you consume it. Your health care provider may recommend that you have the GTT if you:  Have a family history of diabetes.  Are very overweight (obese).  Have experienced infections that keep coming back.  Have had numerous cuts or wounds that did not heal quickly, especially on your legs and feet.  Are a woman and have a history of giving birth to very large babies or a history of repeated fetal loss (stillbirth).  Have had glucose in your urine or high blood sugar: ? During pregnancy. ? After a heart attack, surgery, or prolonged periods of high stress.  The GTT lasts 3-4 hours. Other than the glucose solution, you will not be allowed to eat or drink anything during the test. You must remain at the testing location to make sure that your blood and urine samples are taken on time. How do I prepare for this test? Eat normally for 3 days prior to the GTT test, including having plenty of carbohydrate-rich foods. Do not eat or drink anything except water during the final 12 hours before the test. You should not smoke or exercise during the test. In addition, your health care provider may ask you to stop taking certain medicines before the test. What do the results mean? It is your responsibility to obtain your test results. Ask the lab or department performing the test when and how you will get your results. Contact your health care provider to discuss any questions you have about your results. Range of Normal Values Ranges for normal values may vary among  different labs and hospitals. You should always check with your health care provider after having lab work or other tests done to discuss whether your values are considered within normal limits. Normal levels of blood glucose are as follows:  Fasting: less than 110 mg/dL or less than 6.1 mmol/L (SI units).  1 hour after consuming the glucose drink: less than 200 mg/dL or less than 11.1 mmol/L.  2 hours after consuming the glucose drink: less than 140 mg/dL or less than 7.8 mmol/L.  3 hours after consuming the glucose drink: 70-115 mg/dL or less than 6.4 mmol/L.  4 hours after consuming the glucose drink: 70-115 mg/dL or less than 6.4 mmol/L.  The normal result for the urine test is negative, meaning that glucose is absent from your urine. Some substances can interfere with GTT results. These may include:  Blood pressure and heart failure medicines, including beta blockers, furosemide, and thiazides.  Anti-inflammatory medicines, including aspirin.  Nicotine.  Some psychiatric medicines.  Oral contraceptives.  Diuretics or corticosteroids.  Meaning of Results Outside Normal Value Ranges GTT test results that are above normal values may indicate health problems, such as:  Diabetes mellitus.  Acute stress response.  Cushing syndrome.  Tumors such as pheochromocytoma or glucagonoma.  Chronic renal failure.  Pancreatitis.  Hyperthyroidism.  Current infection.  Discuss your test results with your health care provider. He or she will use the results to make a diagnosis and determine a treatment plan  that is right for you. Talk with your health care provider to discuss your results, treatment options, and if necessary, the need for more tests. Talk with your health care provider if you have any questions about your results. This information is not intended to replace advice given to you by your health care provider. Make sure you discuss any questions you have with your  health care provider. Document Released: 12/27/2004 Document Revised: 08/08/2016 Document Reviewed: 04/10/2014 Elsevier Interactive Patient Education  [employer]2018 [employer]

## 2018-07-17 LAB — CERVICOVAGINAL ANCILLARY ONLY
Bacterial vaginitis: NEGATIVE
Candida vaginitis: POSITIVE — AB
Chlamydia: NEGATIVE
Neisseria Gonorrhea: NEGATIVE
Trichomonas: NEGATIVE

## 2018-07-22 ENCOUNTER — Telehealth: Payer: Self-pay | Admitting: *Deleted

## 2018-07-22 NOTE — Telephone Encounter (Signed)
Pt left message requesting results of urine culture to see if she has a UTI.  I returned pt call after checking with the lab and left a message for pt stating that her test result is not yet finalized. Hopefully we will know something later today and I will send a myChart message once results have been received.

## 2018-07-23 ENCOUNTER — Other Ambulatory Visit: Payer: Self-pay | Admitting: Student

## 2018-07-23 LAB — CULTURE, OB URINE

## 2018-07-23 LAB — URINE CULTURE, OB REFLEX

## 2018-07-23 MED ORDER — CEPHALEXIN 500 MG PO CAPS: 500.0000 mg | ORAL_CAPSULE | Freq: Four times a day (QID) | ORAL | 0 refills | Status: DC

## 2018-07-24 ENCOUNTER — Other Ambulatory Visit (HOSPITAL_COMMUNITY): Payer: Self-pay | Admitting: Obstetrics and Gynecology

## 2018-07-24 ENCOUNTER — Ambulatory Visit (HOSPITAL_COMMUNITY)
Admission: RE | Admit: 2018-07-24 | Discharge: 2018-07-24 | Disposition: A | Discharge status: Home / Self Care | Payer: Medicaid Other | Source: Ambulatory Visit | Attending: Obstetrics and Gynecology | Admitting: Obstetrics and Gynecology

## 2018-07-24 DIAGNOSIS — Z362 Encounter for other antenatal screening follow-up: Secondary | ICD-10-CM | POA: Diagnosis not present

## 2018-07-24 DIAGNOSIS — O09212 Supervision of pregnancy with history of pre-term labor, second trimester: Secondary | ICD-10-CM | POA: Insufficient documentation

## 2018-07-24 DIAGNOSIS — O34219 Maternal care for unspecified type scar from previous cesarean delivery: Secondary | ICD-10-CM | POA: Insufficient documentation

## 2018-07-24 DIAGNOSIS — O09219 Supervision of pregnancy with history of pre-term labor, unspecified trimester: Secondary | ICD-10-CM

## 2018-07-24 DIAGNOSIS — Z3A25 25 weeks gestation of pregnancy: Secondary | ICD-10-CM | POA: Insufficient documentation

## 2018-07-25 ENCOUNTER — Telehealth: Payer: Self-pay | Admitting: Student

## 2018-07-25 MED ORDER — NITROFURANTOIN MONOHYD MACRO 100 MG PO CAPS: 100.0000 mg | ORAL_CAPSULE | Freq: Every day | ORAL | 3 refills | Status: DC

## 2018-07-25 NOTE — Telephone Encounter (Signed)
Left message informing patient of urine results and that antibiotic was waiting at her pharmacy on file.

## 2018-07-27 NOTE — Telephone Encounter (Signed)
Entered in error

## 2018-08-12 ENCOUNTER — Other Ambulatory Visit: Payer: Self-pay

## 2018-08-12 DIAGNOSIS — Z3480 Encounter for supervision of other normal pregnancy, unspecified trimester: Secondary | ICD-10-CM

## 2018-08-13 ENCOUNTER — Encounter: Payer: Self-pay | Admitting: Medical

## 2018-08-13 ENCOUNTER — Other Ambulatory Visit: Payer: Self-pay

## 2018-08-21 ENCOUNTER — Telehealth: Payer: Self-pay

## 2018-08-21 NOTE — Telephone Encounter (Signed)
Called patient to reschedule her missed appt from today (9/4) for 9/9 @ 2:35. Patient did not have a voicemail set-up. I messaged her on mychart with her new appt.

## 2018-08-26 ENCOUNTER — Encounter: Payer: Self-pay | Admitting: Advanced Practice Midwife

## 2018-08-27 ENCOUNTER — Other Ambulatory Visit: Payer: Medicaid Other

## 2018-08-27 ENCOUNTER — Ambulatory Visit (INDEPENDENT_AMBULATORY_CARE_PROVIDER_SITE_OTHER): Payer: Medicaid Other | Admitting: Obstetrics and Gynecology

## 2018-08-27 ENCOUNTER — Encounter: Payer: Self-pay | Admitting: *Deleted

## 2018-08-27 VITALS — BP 108/61 | HR 85 | Wt 149.0 lb

## 2018-08-27 DIAGNOSIS — Z23 Encounter for immunization: Secondary | ICD-10-CM | POA: Diagnosis not present

## 2018-08-27 DIAGNOSIS — Z3480 Encounter for supervision of other normal pregnancy, unspecified trimester: Secondary | ICD-10-CM | POA: Diagnosis not present

## 2018-08-27 DIAGNOSIS — Z3483 Encounter for supervision of other normal pregnancy, third trimester: Secondary | ICD-10-CM

## 2018-08-27 DIAGNOSIS — Z98891 History of uterine scar from previous surgery: Secondary | ICD-10-CM

## 2018-08-27 DIAGNOSIS — O98313 Other infections with a predominantly sexual mode of transmission complicating pregnancy, third trimester: Secondary | ICD-10-CM

## 2018-08-27 DIAGNOSIS — O98319 Other infections with a predominantly sexual mode of transmission complicating pregnancy, unspecified trimester: Secondary | ICD-10-CM

## 2018-08-27 DIAGNOSIS — A6009 Herpesviral infection of other urogenital tract: Secondary | ICD-10-CM

## 2018-08-27 DIAGNOSIS — Z8751 Personal history of pre-term labor: Secondary | ICD-10-CM | POA: Diagnosis not present

## 2018-08-27 DIAGNOSIS — O2341 Unspecified infection of urinary tract in pregnancy, first trimester: Secondary | ICD-10-CM | POA: Diagnosis not present

## 2018-08-27 MED ORDER — VALACYCLOVIR HCL 500 MG PO TABS: 500.0000 mg | ORAL_TABLET | Freq: Two times a day (BID) | ORAL | 1 refills | Status: DC

## 2018-08-27 MED ORDER — RANITIDINE HCL 150 MG PO TABS: 150.0000 mg | ORAL_TABLET | Freq: Two times a day (BID) | ORAL | 1 refills | Status: DC

## 2018-08-27 NOTE — Progress Notes (Signed)
   PRENATAL VISIT NOTE  Subjective:  Leah Walls is a 32 y.o. [id number] at [redacted]w[redacted]d being seen today for ongoing prenatal care.  She is currently monitored for the following issues for this low-risk pregnancy and has History of VBAC x4; Grand multiparity, antepartum; Breast lump on right side at 1 o'clock position; Marijuana use; Genital herpes affecting pregnancy, antepartum; Umbilical hernia; UTI (urinary tract infection) during pregnancy; Supervision of other normal pregnancy, antepartum; and History of preterm delivery on their problem list.  Patient reports no complaints.  Contractions: Not present. Vag. Bleeding: None.  Movement: Present. Denies leaking of fluid.   The following portions of the patient's history were reviewed and updated as appropriate: allergies, current medications, past family history, past medical history, past social history, past surgical history and problem list. Problem list updated.  Objective:   Vitals:   08/27/18 0948  BP: 108/61  Pulse: 85  Weight: 149 lb (67.6 kg)    Fetal Status: Fetal Heart Rate (bpm): 150 Fundal Height: 30 cm Movement: Present     General:  Alert, oriented and cooperative. Patient is in no acute distress.  Skin: Skin is warm and dry. No rash noted.   Cardiovascular: Normal heart rate noted  Respiratory: Normal respiratory effort, no problems with respiration noted  Abdomen: Soft, gravid, appropriate for gestational age.  Pain/Pressure: Present     Pelvic: Cervical exam deferred        Extremities: Normal range of motion.  Edema: None  Mental Status: Normal mood and affect. Normal behavior. Normal judgment and thought content.   Assessment and Plan:  Pregnancy: G7P5105 at [redacted]w[redacted]d  1. Need for Tdap vaccination - Tdap vaccine greater than or equal to 7yo IM  2. Urinary tract infection in mother during first trimester of pregnancy  -Urine culture today   3. Supervision of other normal pregnancy, antepartum  - Doing well today    - 2 hour GTT today, patient able to keep juice down.   4. Genital herpes affecting pregnancy, antepartum  - Rx Valtrex suppression start at 36 weeks   5. History of VBAC x4  -Consent signed today   Preterm labor symptoms and general obstetric precautions including but not limited to vaginal bleeding, contractions, leaking of fluid and fetal movement were reviewed in detail with the patient. Please refer to After Visit Summary for other counseling recommendations.  Return in about 2 weeks (around 09/10/2018).  No future appointments.  Venia Carbon, NP

## 2018-08-28 LAB — CBC
Hematocrit: 30.1 % — ABNORMAL LOW (ref 34.0–46.6)
Hemoglobin: 9.9 g/dL — ABNORMAL LOW (ref 11.1–15.9)
MCH: 28.8 pg (ref 26.6–33.0)
MCHC: 32.9 g/dL (ref 31.5–35.7)
MCV: 88 fL (ref 79–97)
Platelets: 237 10*3/uL (ref 150–450)
RBC: 3.44 x10E6/uL — ABNORMAL LOW (ref 3.77–5.28)
RDW: 12.6 % (ref 12.3–15.4)
WBC: 8.4 10*3/uL (ref 3.4–10.8)

## 2018-08-28 LAB — HIV ANTIBODY (ROUTINE TESTING W REFLEX): HIV Screen 4th Generation wRfx: NONREACTIVE

## 2018-08-28 LAB — GLUCOSE TOLERANCE, 2 HOURS W/ 1HR
Glucose, 1 hour: 176 mg/dL (ref 65–179)
Glucose, 2 hour: 155 mg/dL — ABNORMAL HIGH (ref 65–152)
Glucose, Fasting: 86 mg/dL (ref 65–91)

## 2018-08-28 LAB — RPR: RPR Ser Ql: NONREACTIVE

## 2018-08-29 ENCOUNTER — Encounter: Payer: Self-pay | Admitting: Student

## 2018-08-29 ENCOUNTER — Telehealth: Payer: Self-pay | Admitting: Advanced Practice Midwife

## 2018-08-29 ENCOUNTER — Encounter: Payer: Self-pay | Admitting: *Deleted

## 2018-08-29 DIAGNOSIS — Z8632 Personal history of gestational diabetes: Secondary | ICD-10-CM | POA: Insufficient documentation

## 2018-08-29 DIAGNOSIS — O24410 Gestational diabetes mellitus in pregnancy, diet controlled: Secondary | ICD-10-CM | POA: Insufficient documentation

## 2018-08-29 DIAGNOSIS — O24419 Gestational diabetes mellitus in pregnancy, unspecified control: Secondary | ICD-10-CM

## 2018-08-29 NOTE — Telephone Encounter (Signed)
Called appt to let her know about her diabetes appt on 9/19 @ 10a but there was no way to leave a voicemail. Sent patient a Clinical cytogeneticistmychart message with the appt day and time and to reach out to us if she needed to reschedule.

## 2018-09-05 ENCOUNTER — Encounter: Payer: Medicaid Other | Attending: Obstetrics and Gynecology | Admitting: *Deleted

## 2018-09-05 ENCOUNTER — Ambulatory Visit: Payer: Medicaid Other | Admitting: *Deleted

## 2018-09-05 DIAGNOSIS — O24410 Gestational diabetes mellitus in pregnancy, diet controlled: Secondary | ICD-10-CM | POA: Insufficient documentation

## 2018-09-05 DIAGNOSIS — Z713 Dietary counseling and surveillance: Secondary | ICD-10-CM | POA: Diagnosis not present

## 2018-09-05 MED ORDER — ACCU-CHEK FASTCLIX LANCETS MISC: 1.0000 | Freq: Four times a day (QID) | 12 refills | Status: DC

## 2018-09-05 MED ORDER — ACCU-CHEK GUIDE W/DEVICE KIT: 1.0000 | PACK | Freq: Once | 0 refills | Status: AC

## 2018-09-05 MED ORDER — GLUCOSE BLOOD VI STRP: ORAL_STRIP | 12 refills | Status: DC

## 2018-09-05 NOTE — Progress Notes (Signed)
  Patient was seen on 09/05/2018 for Gestational Diabetes self-management. EDD 11/05/2018. Patient states no history of GDM. Diet history obtained. Patient eats poor variety of all food groups with multiple foods and beverages with high concentration of sugar including cookies, desserts and sugary beverages. Beverages include water, sweetened Kool-aid, sweetened coffee occasionally, milkshakes.  The following learning objectives were met by the patient :   States the definition of Gestational Diabetes  States why dietary management is important in controlling blood glucose  Describes the effects of carbohydrates on blood glucose levels  Demonstrates ability to create a balanced meal plan  Demonstrates carbohydrate counting   States when to check blood glucose levels  Demonstrates proper blood glucose monitoring techniques  States the effect of stress and exercise on blood glucose levels  States the importance of limiting caffeine and abstaining from alcohol and smoking  Plan:  Aim for 4 Carb Choices per meal (60 grams) +/- 1 either way  Aim for 1-2 Carbs per snack Begin reading food labels for Total Carbohydrate of foods If OK with your MD, consider  increasing your activity level by walking, Arm Chair Exercises or other activity daily as tolerated Begin checking BG before breakfast and 2 hours after first bite of breakfast, lunch and dinner as directed by MD  Bring Log Book/Sheet to every medical appointment OR  Baby Scripts:  Patient was introduced to [employer] and plans to use as record of BG electronically  Take medication if directed by MD  Blood glucose monitor Rx called into pharmacy: Accu Check Guide with Fast Clix drums Patient instructed to test pre breakfast and 2 hours each meal as directed by MD  Patient instructed to monitor glucose levels: FBS: 60 - 95 mg/dl 2 hour: <120 mg/dl  Patient received the following handouts:  Nutrition Diabetes and  Pregnancy  Carbohydrate Counting List  Patient will be seen for follow-up as needed.

## 2018-09-10 ENCOUNTER — Encounter: Payer: Self-pay | Admitting: Nurse Practitioner

## 2018-09-11 ENCOUNTER — Ambulatory Visit (INDEPENDENT_AMBULATORY_CARE_PROVIDER_SITE_OTHER): Payer: Medicaid Other | Admitting: Obstetrics & Gynecology

## 2018-09-11 ENCOUNTER — Ambulatory Visit: Payer: Self-pay

## 2018-09-11 VITALS — BP 105/82 | HR 99 | Wt 152.3 lb

## 2018-09-11 DIAGNOSIS — Z3480 Encounter for supervision of other normal pregnancy, unspecified trimester: Secondary | ICD-10-CM | POA: Diagnosis not present

## 2018-09-11 DIAGNOSIS — O24419 Gestational diabetes mellitus in pregnancy, unspecified control: Secondary | ICD-10-CM | POA: Diagnosis not present

## 2018-09-11 DIAGNOSIS — Z98891 History of uterine scar from previous surgery: Secondary | ICD-10-CM

## 2018-09-11 DIAGNOSIS — O98313 Other infections with a predominantly sexual mode of transmission complicating pregnancy, third trimester: Secondary | ICD-10-CM | POA: Diagnosis not present

## 2018-09-11 DIAGNOSIS — O0940 Supervision of pregnancy with grand multiparity, unspecified trimester: Secondary | ICD-10-CM | POA: Diagnosis not present

## 2018-09-11 DIAGNOSIS — A6009 Herpesviral infection of other urogenital tract: Secondary | ICD-10-CM | POA: Diagnosis not present

## 2018-09-11 DIAGNOSIS — O98319 Other infections with a predominantly sexual mode of transmission complicating pregnancy, unspecified trimester: Principal | ICD-10-CM

## 2018-09-11 MED ORDER — METFORMIN HCL 500 MG PO TABS: ORAL_TABLET | ORAL | 3 refills | Status: DC

## 2018-09-11 NOTE — Progress Notes (Signed)
   PRENATAL VISIT NOTE  Subjective:  Leah Walls is a 32 y.o. [id number] at 6552w1d being seen today for ongoing prenatal care.  She is currently monitored for the following issues for this high-risk pregnancy and has History of VBAC x4; Grand multiparity, antepartum; Breast lump on right side at 1 o'clock position; Marijuana use; Genital herpes affecting pregnancy, antepartum; Umbilical hernia; UTI (urinary tract infection) during pregnancy; Supervision of other normal pregnancy, antepartum; History of preterm delivery; and Gestational diabetes on their problem list.  Patient reports congestion.  Contractions: Not present. Vag. Bleeding: None.  Movement: Present. Denies leaking of fluid.   The following portions of the patient's history were reviewed and updated as appropriate: allergies, current medications, past family history, past medical history, past social history, past surgical history and problem list. Problem list updated.  Objective:   Vitals:   09/11/18 1337  BP: 105/82  Pulse: 99  Weight: 152 lb 4.8 oz (69.1 kg)    Fetal Status: Fetal Heart Rate (bpm): 154   Movement: Present     General:  Alert, oriented and cooperative. Patient is in no acute distress.  Skin: Skin is warm and dry. No rash noted.   Cardiovascular: Normal heart rate noted  Respiratory: Normal respiratory effort, no problems with respiration noted  Abdomen: Soft, gravid, appropriate for gestational age.  Pain/Pressure: Present     Pelvic: Cervical exam deferred        Extremities: Normal range of motion.  Edema: None  Mental Status: Normal mood and affect. Normal behavior. Normal judgment and thought content.   Assessment and Plan:  Pregnancy: G7P5105 at 6052w1d  1. Genital herpes affecting pregnancy, antepartum - on valtrex  2. Gestational diabetes mellitus (GDM) in third trimester, gestational diabetes method of control unspecified  - Hemoglobin A1c - fastings in the low 100s - start metformin 500  mg qhs - start weekly BPP with NST with MFM  3. Supervision of other normal pregnancy, antepartum   4. History of VBAC x4 -has signed VBAC consent  5. Grand multiparity, antepartum   Preterm labor symptoms and general obstetric precautions including but not limited to vaginal bleeding, contractions, leaking of fluid and fetal movement were reviewed in detail with the patient. Please refer to After Visit Summary for other counseling recommendations.  No follow-ups on file.  No future appointments.  Allie BossierMyra C Christain Niznik, MD

## 2018-09-12 ENCOUNTER — Encounter: Payer: Self-pay | Admitting: General Practice

## 2018-09-12 ENCOUNTER — Other Ambulatory Visit: Payer: Self-pay | Admitting: Obstetrics and Gynecology

## 2018-09-12 DIAGNOSIS — K219 Gastro-esophageal reflux disease without esophagitis: Secondary | ICD-10-CM

## 2018-09-12 DIAGNOSIS — Z3480 Encounter for supervision of other normal pregnancy, unspecified trimester: Secondary | ICD-10-CM

## 2018-09-12 LAB — HEMOGLOBIN A1C
Est. average glucose Bld gHb Est-mCnc: 123 mg/dL
Hgb A1c MFr Bld: 5.9 % — ABNORMAL HIGH (ref 4.8–5.6)

## 2018-09-12 NOTE — Progress Notes (Signed)
Patient triggered in BRX for elevated blood sugars. Per chart review, patient saw Dr Marice Potter yesterday who reviewed her blood sugars and started patient on Metformin. Patient has follow up appt in office 10/4.

## 2018-09-16 ENCOUNTER — Telehealth: Payer: Self-pay | Admitting: General Practice

## 2018-09-16 NOTE — Telephone Encounter (Signed)
Patient called and left message on nurse voicemail line stating she needs a refill on her heartburn medication.

## 2018-09-17 ENCOUNTER — Telehealth: Payer: Self-pay | Admitting: *Deleted

## 2018-09-17 ENCOUNTER — Telehealth: Payer: Self-pay | Admitting: Family Medicine

## 2018-09-17 ENCOUNTER — Other Ambulatory Visit: Payer: Self-pay

## 2018-09-17 DIAGNOSIS — Z3480 Encounter for supervision of other normal pregnancy, unspecified trimester: Secondary | ICD-10-CM

## 2018-09-17 MED ORDER — RANITIDINE HCL 150 MG PO TABS: 150.0000 mg | ORAL_TABLET | Freq: Two times a day (BID) | ORAL | 1 refills | Status: DC

## 2018-09-17 NOTE — Telephone Encounter (Signed)
Patient called in regards to getting her medication refilled. Stated that she has left several messages on the nurse line and no one has gotten back to her yet.

## 2018-09-17 NOTE — Telephone Encounter (Signed)
Received a fax re: refill of omeprazole. Do see she had refill of zantac recently. Will route to provider. See there is a pended order already.

## 2018-09-17 NOTE — Telephone Encounter (Signed)
Pt called and left several voicemails to refill her Zantac, Per Arbie Cookey, CNM I refilled her Prescription and called pt and left a voicemail letting her know I sent it to her pharmacy.

## 2018-09-18 ENCOUNTER — Other Ambulatory Visit: Payer: Self-pay

## 2018-09-18 DIAGNOSIS — Z3480 Encounter for supervision of other normal pregnancy, unspecified trimester: Secondary | ICD-10-CM

## 2018-09-18 MED ORDER — RANITIDINE HCL 150 MG PO TABS: 150.0000 mg | ORAL_TABLET | Freq: Two times a day (BID) | ORAL | 2 refills | Status: DC

## 2018-09-18 NOTE — Telephone Encounter (Signed)
Pt called in for an rx refill for her heartburn rx.states her pharmacy in file is "not getting it" printed rx for pick up.

## 2018-09-19 ENCOUNTER — Other Ambulatory Visit: Payer: Self-pay

## 2018-09-19 DIAGNOSIS — Z3480 Encounter for supervision of other normal pregnancy, unspecified trimester: Secondary | ICD-10-CM

## 2018-09-19 DIAGNOSIS — K219 Gastro-esophageal reflux disease without esophagitis: Secondary | ICD-10-CM

## 2018-09-19 MED ORDER — OMEPRAZOLE 40 MG PO CPDR: DELAYED_RELEASE_CAPSULE | ORAL | 2 refills | Status: DC

## 2018-09-19 NOTE — Telephone Encounter (Signed)
Pt was prescribed wrong heart burn rx, changed rx and printed so pt could take to pharmacy. Advised pt to have Zantac removed from her rx list so there is no further confusion.

## 2018-09-20 ENCOUNTER — Encounter (HOSPITAL_COMMUNITY): Payer: Self-pay

## 2018-09-20 ENCOUNTER — Other Ambulatory Visit (HOSPITAL_COMMUNITY): Payer: Self-pay | Admitting: *Deleted

## 2018-09-20 ENCOUNTER — Ambulatory Visit (INDEPENDENT_AMBULATORY_CARE_PROVIDER_SITE_OTHER): Payer: Medicaid Other | Admitting: Obstetrics and Gynecology

## 2018-09-20 ENCOUNTER — Ambulatory Visit (INDEPENDENT_AMBULATORY_CARE_PROVIDER_SITE_OTHER): Payer: Medicaid Other | Admitting: *Deleted

## 2018-09-20 ENCOUNTER — Ambulatory Visit (HOSPITAL_COMMUNITY)
Admission: RE | Admit: 2018-09-20 | Discharge: 2018-09-20 | Disposition: A | Discharge status: Home / Self Care | Payer: Medicaid Other | Source: Ambulatory Visit | Attending: Obstetrics & Gynecology | Admitting: Obstetrics & Gynecology

## 2018-09-20 VITALS — BP 100/62 | HR 96 | Wt 152.4 lb

## 2018-09-20 DIAGNOSIS — A6009 Herpesviral infection of other urogenital tract: Secondary | ICD-10-CM

## 2018-09-20 DIAGNOSIS — O34219 Maternal care for unspecified type scar from previous cesarean delivery: Secondary | ICD-10-CM

## 2018-09-20 DIAGNOSIS — O09213 Supervision of pregnancy with history of pre-term labor, third trimester: Secondary | ICD-10-CM

## 2018-09-20 DIAGNOSIS — Z98891 History of uterine scar from previous surgery: Secondary | ICD-10-CM

## 2018-09-20 DIAGNOSIS — O2341 Unspecified infection of urinary tract in pregnancy, first trimester: Secondary | ICD-10-CM

## 2018-09-20 DIAGNOSIS — Z8751 Personal history of pre-term labor: Secondary | ICD-10-CM

## 2018-09-20 DIAGNOSIS — Z3A33 33 weeks gestation of pregnancy: Secondary | ICD-10-CM | POA: Insufficient documentation

## 2018-09-20 DIAGNOSIS — Z3480 Encounter for supervision of other normal pregnancy, unspecified trimester: Secondary | ICD-10-CM

## 2018-09-20 DIAGNOSIS — O24419 Gestational diabetes mellitus in pregnancy, unspecified control: Secondary | ICD-10-CM

## 2018-09-20 DIAGNOSIS — O24415 Gestational diabetes mellitus in pregnancy, controlled by oral hypoglycemic drugs: Secondary | ICD-10-CM | POA: Diagnosis not present

## 2018-09-20 DIAGNOSIS — O98319 Other infections with a predominantly sexual mode of transmission complicating pregnancy, unspecified trimester: Secondary | ICD-10-CM

## 2018-09-20 DIAGNOSIS — O0990 Supervision of high risk pregnancy, unspecified, unspecified trimester: Secondary | ICD-10-CM

## 2018-09-20 MED ORDER — METFORMIN HCL 500 MG PO TABS: ORAL_TABLET | ORAL | 3 refills | Status: DC

## 2018-09-20 NOTE — Progress Notes (Signed)
[place] for growth and BPP done today. Pt states she was not told about needing Nitrofurantoin daily.

## 2018-09-23 ENCOUNTER — Encounter: Payer: Self-pay | Admitting: Obstetrics and Gynecology

## 2018-09-23 ENCOUNTER — Encounter: Payer: Self-pay | Admitting: General Practice

## 2018-09-23 NOTE — Progress Notes (Signed)
Prenatal Visit Note Date: 09/20/2018 Clinic: [facility]  Subjective:  Leah Walls is a 32 y.o. [phone] at [redacted]w[redacted]d being seen today for ongoing prenatal care.  She is currently monitored for the following issues for this high-risk pregnancy and has History of VBAC x4; Grand multiparity, antepartum; Breast lump on right side at 1 o'clock position; Marijuana use; Genital herpes affecting pregnancy, antepartum; Umbilical hernia; UTI (urinary tract infection) during pregnancy; Supervision of other normal pregnancy, antepartum; History of preterm delivery; and Gestational diabetes on their problem list.  Patient reports no complaints.   Contractions: Irregular. Vag. Bleeding: None.  Movement: Present. Denies leaking of fluid.   The following portions of the patient's history were reviewed and updated as appropriate: allergies, current medications, past family history, past medical history, past social history, past surgical history and problem list. Problem list updated.  Objective:   Vitals:   09/20/18 1100  BP: 100/62  Pulse: 96  Weight: 152 lb 6.4 oz (69.1 kg)    Fetal Status: Fetal Heart Rate (bpm): NST   Movement: Present     General:  Alert, oriented and cooperative. Patient is in no acute distress.  Skin: Skin is warm and dry. No rash noted.   Cardiovascular: Normal heart rate noted  Respiratory: Normal respiratory effort, no problems with respiration noted  Abdomen: Soft, gravid, appropriate for gestational age. Pain/Pressure: Present     Pelvic:  Cervical exam deferred        Extremities: Normal range of motion.     Mental Status: Normal mood and affect. Normal behavior. Normal judgment and thought content.   Urinalysis:      Assessment and Plan:  Pregnancy: G7P5106 at [redacted]w[redacted]d  1. Supervision of other normal pregnancy, antepartum Routine care. Doesn't want btl  2. GDMa2 Pt on metformin 500 qhs. No GI s/s. Recommend going to 1000mg  qhs to help with AM  fastings which are running in the 100s  3. Genital herpes affecting pregnancy, antepartum Start ppx nv. U/s today with bpp 8/8, cephalic, efw 47%, 67% AC. rNST today.   4. History of VBAC x4 tolac consent already signed  Preterm labor symptoms and general obstetric precautions including but not limited to vaginal bleeding, contractions, leaking of fluid and fetal movement were reviewed in detail with the patient. Please refer to After Visit Summary for other counseling recommendations.  Return in about 1 week (around 09/27/2018) for NST only;  10/18 needs HOB and NST.   Cacao Bing, MD

## 2018-09-23 NOTE — Progress Notes (Signed)
Patient triggered in BRX for elevated blood sugars. Per chart review, patient was seen for appt in office 10/4 & metformin dose was increased. Patient has follow up appt 10/11 for NST.

## 2018-09-27 ENCOUNTER — Ambulatory Visit (HOSPITAL_COMMUNITY): Admission: RE | Admit: 2018-09-27 | Payer: Medicaid Other | Source: Ambulatory Visit

## 2018-09-27 ENCOUNTER — Other Ambulatory Visit: Payer: Self-pay

## 2018-10-01 ENCOUNTER — Telehealth: Payer: Self-pay | Admitting: *Deleted

## 2018-10-03 ENCOUNTER — Telehealth: Payer: Self-pay | Admitting: Obstetrics & Gynecology

## 2018-10-03 NOTE — Telephone Encounter (Signed)
I have tried to reach patient on several occasions about moving her appointment to 8:15. I was not able to reach her due to her phone being off. I tried to call her mother, but was not able to reach her or leave a message. If patient comes in at 11:15, we will still see her.

## 2018-10-04 ENCOUNTER — Ambulatory Visit (INDEPENDENT_AMBULATORY_CARE_PROVIDER_SITE_OTHER): Payer: Medicaid Other | Admitting: *Deleted

## 2018-10-04 ENCOUNTER — Ambulatory Visit: Payer: Self-pay

## 2018-10-04 ENCOUNTER — Ambulatory Visit (INDEPENDENT_AMBULATORY_CARE_PROVIDER_SITE_OTHER): Payer: Medicaid Other | Admitting: Obstetrics & Gynecology

## 2018-10-04 ENCOUNTER — Ambulatory Visit (HOSPITAL_COMMUNITY): Payer: Medicaid Other

## 2018-10-04 VITALS — BP 102/57 | HR 82 | Wt 150.9 lb

## 2018-10-04 DIAGNOSIS — O24419 Gestational diabetes mellitus in pregnancy, unspecified control: Secondary | ICD-10-CM

## 2018-10-04 DIAGNOSIS — A6009 Herpesviral infection of other urogenital tract: Secondary | ICD-10-CM

## 2018-10-04 DIAGNOSIS — O0940 Supervision of pregnancy with grand multiparity, unspecified trimester: Secondary | ICD-10-CM

## 2018-10-04 DIAGNOSIS — Z3480 Encounter for supervision of other normal pregnancy, unspecified trimester: Secondary | ICD-10-CM

## 2018-10-04 DIAGNOSIS — Z98891 History of uterine scar from previous surgery: Secondary | ICD-10-CM

## 2018-10-04 DIAGNOSIS — O2340 Unspecified infection of urinary tract in pregnancy, unspecified trimester: Secondary | ICD-10-CM

## 2018-10-04 DIAGNOSIS — Z8751 Personal history of pre-term labor: Secondary | ICD-10-CM

## 2018-10-04 DIAGNOSIS — Z9119 Patient's noncompliance with other medical treatment and regimen: Secondary | ICD-10-CM

## 2018-10-04 DIAGNOSIS — O0990 Supervision of high risk pregnancy, unspecified, unspecified trimester: Secondary | ICD-10-CM

## 2018-10-04 DIAGNOSIS — Z91199 Patient's noncompliance with other medical treatment and regimen due to unspecified reason: Secondary | ICD-10-CM

## 2018-10-04 DIAGNOSIS — O98319 Other infections with a predominantly sexual mode of transmission complicating pregnancy, unspecified trimester: Secondary | ICD-10-CM

## 2018-10-04 NOTE — Progress Notes (Signed)
   PRENATAL VISIT NOTE  Subjective:  Leah Walls is a 32 y.o. G7P5106 at [redacted]w[redacted]d being seen today for ongoing prenatal care.  She is currently monitored for the following issues for this high-risk pregnancy and has Supervision of high risk pregnancy, antepartum; History of VBAC x4; Grand multiparity, antepartum; Breast lump on right side at 1 o'clock position; Marijuana use; Genital herpes affecting pregnancy, antepartum; Umbilical hernia; UTI (urinary tract infection) during pregnancy; History of preterm delivery; and GDM, class A2 on their problem list.  Patient reports no complaints.  Contractions: Irregular. Vag. Bleeding: None.  Movement: Present. Denies leaking of fluid.   The following portions of the patient's history were reviewed and updated as appropriate: allergies, current medications, past family history, past medical history, past social history, past surgical history and problem list. Problem list updated.  Objective:   Vitals:   10/04/18 1058  BP: (!) 102/57  Pulse: 82  Weight: 150 lb 14.4 oz (68.4 kg)    Fetal Status: Fetal Heart Rate (bpm): NST   Movement: Present     General:  Alert, oriented and cooperative. Patient is in no acute distress.  Skin: Skin is warm and dry. No rash noted.   Cardiovascular: Normal heart rate noted  Respiratory: Normal respiratory effort, no problems with respiration noted  Abdomen: Soft, gravid, appropriate for gestational age.  Pain/Pressure: Present     Pelvic: Cervical exam deferred        Extremities: Normal range of motion.     Mental Status: Normal mood and affect. Normal behavior. Normal judgment and thought content.   Assessment and Plan:  Pregnancy: G7P5106 at [redacted]w[redacted]d  1. Supervision of other normal pregnancy, antepartum  2. GDM, class A2 On Metformin 1000mg  po qhs. Will increase to 1000mg  qhs and 500mg  in the am  NST reviewed and reactive.  Pt reports that she has not been charting her glc but that her fasting glc was 91  this am but, all other related levels were >100.  There were few other glc levels noted.         3. History of VBAC x5  4. Poor compliance Pt encouraged to chart her glc and bring to the next appt.   5. Supervision of high risk pregnancy, antepartum  6. Urinary tract infection in mother during pregnancy, antepartum  7. History of preterm delivery  8. Grand multiparity, antepartum  9. Genital herpes affecting pregnancy, antepartum Valtrex  Preterm labor symptoms and general obstetric precautions including but not limited to vaginal bleeding, contractions, leaking of fluid and fetal movement were reviewed in detail with the patient. Please refer to After Visit Summary for other counseling recommendations.  Return in about 1 week (around 10/11/2018) for NST/BPP and HOB;  11/1 HOB - has [place] @ 0945.  Future Appointments  Date Time Provider Department Center  10/18/2018  9:45 AM [facility] [place] 2 [facility]    Willodean Rosenthal, MD

## 2018-10-04 NOTE — Progress Notes (Addendum)
Pt informed that the ultrasound is considered a limited OB ultrasound and is not intended to be a complete ultrasound exam.  Patient also informed that the ultrasound is not being completed with the intent of assessing for fetal or placental anomalies or any pelvic abnormalities.  Explained that the purpose of today's ultrasound is to assess for presentation, BPP and amniotic fluid volume.  Patient acknowledges the purpose of the exam and the limitations of the study.    Attestation of Attending Supervision of RN: Evaluation and management procedures were performed by the nurse under my supervision and collaboration.  I have reviewed the nursing note and chart, and I agree with the management and plan.  Carolyn L. Harraway-Smith, M.D., FACOG   

## 2018-10-04 NOTE — Progress Notes (Signed)
Pt states she did not know about Rx for Macrobid and is not taking. Pt requests to have weekly BPP in this office rather than MFM.

## 2018-10-07 ENCOUNTER — Encounter: Payer: Self-pay | Admitting: *Deleted

## 2018-10-10 ENCOUNTER — Encounter: Payer: Self-pay | Admitting: Student

## 2018-10-11 ENCOUNTER — Other Ambulatory Visit (HOSPITAL_COMMUNITY)
Admission: RE | Admit: 2018-10-11 | Discharge: 2018-10-11 | Disposition: A | Discharge status: Home / Self Care | Payer: Medicaid Other | Source: Ambulatory Visit | Attending: Student | Admitting: Student

## 2018-10-11 ENCOUNTER — Ambulatory Visit (INDEPENDENT_AMBULATORY_CARE_PROVIDER_SITE_OTHER): Payer: Medicaid Other | Admitting: *Deleted

## 2018-10-11 ENCOUNTER — Other Ambulatory Visit (HOSPITAL_COMMUNITY): Payer: Self-pay

## 2018-10-11 ENCOUNTER — Ambulatory Visit: Payer: Self-pay

## 2018-10-11 ENCOUNTER — Ambulatory Visit (HOSPITAL_COMMUNITY): Payer: Medicaid Other

## 2018-10-11 ENCOUNTER — Ambulatory Visit (INDEPENDENT_AMBULATORY_CARE_PROVIDER_SITE_OTHER): Payer: Medicaid Other | Admitting: Student

## 2018-10-11 VITALS — BP 109/58 | HR 100 | Wt 149.5 lb

## 2018-10-11 DIAGNOSIS — Z91199 Patient's noncompliance with other medical treatment and regimen due to unspecified reason: Secondary | ICD-10-CM

## 2018-10-11 DIAGNOSIS — Z9119 Patient's noncompliance with other medical treatment and regimen: Secondary | ICD-10-CM

## 2018-10-11 DIAGNOSIS — O24419 Gestational diabetes mellitus in pregnancy, unspecified control: Secondary | ICD-10-CM

## 2018-10-11 DIAGNOSIS — Z3483 Encounter for supervision of other normal pregnancy, third trimester: Secondary | ICD-10-CM | POA: Diagnosis not present

## 2018-10-11 DIAGNOSIS — Z3480 Encounter for supervision of other normal pregnancy, unspecified trimester: Secondary | ICD-10-CM

## 2018-10-11 DIAGNOSIS — B373 Candidiasis of vulva and vagina: Secondary | ICD-10-CM

## 2018-10-11 DIAGNOSIS — Z3A36 36 weeks gestation of pregnancy: Secondary | ICD-10-CM | POA: Diagnosis not present

## 2018-10-11 DIAGNOSIS — B3731 Acute candidiasis of vulva and vagina: Secondary | ICD-10-CM

## 2018-10-11 DIAGNOSIS — N898 Other specified noninflammatory disorders of vagina: Secondary | ICD-10-CM

## 2018-10-11 LAB — GLUCOSE, CAPILLARY: Glucose-Capillary: 88 mg/dL (ref 70–99)

## 2018-10-11 MED ORDER — TERCONAZOLE 0.8 % VA CREA: 1.0000 | TOPICAL_CREAM | Freq: Every day | VAGINAL | 0 refills | Status: DC

## 2018-10-11 NOTE — Progress Notes (Signed)
   PRENATAL VISIT NOTE  Subjective:  Leah Walls is a 32 y.o. G7P5106 at [redacted]w[redacted]d being seen today for ongoing prenatal care.  She is currently monitored for the following issues for this high-risk pregnancy and has History of VBAC x4; Grand multiparity, antepartum; Breast lump on right side at 1 o'clock position; Marijuana use; Genital herpes affecting pregnancy, antepartum; Umbilical hernia; UTI (urinary tract infection) during pregnancy; Supervision of other normal pregnancy, antepartum; History of preterm delivery; and GDM, class A2 on their problem list.  Patient reports vaginal irritation & thick white discharge.  Contractions: Irregular. Vag. Bleeding: None.  Movement: Present. Denies leaking of fluid.   The following portions of the patient's history were reviewed and updated as appropriate: allergies, current medications, past family history, past medical history, past social history, past surgical history and problem list. Problem list updated.  Objective:   Vitals:   10/11/18 1025  BP: (!) 109/58  Pulse: 100  Weight: 149 lb 8 oz (67.8 kg)    Fetal Status: Fetal Heart Rate (bpm): NST Fundal Height: 35 cm Movement: Present  Presentation: Vertex  General:  Alert, oriented and cooperative. Patient is in no acute distress.  Skin: Skin is warm and dry. No rash noted.   Cardiovascular: Normal heart rate noted  Respiratory: Normal respiratory effort, no problems with respiration noted  Abdomen: Soft, gravid, appropriate for gestational age.  Pain/Pressure: Present     Pelvic: Cervical exam performed Dilation: 1 Effacement (%): 50 Station: -3  Moderate amount of yellow clumpy discharge.   Extremities: Normal range of motion.     Mental Status: Normal mood and affect. Normal behavior. Normal judgment and thought content.   Assessment and Plan:  Pregnancy: G7P5106 at [redacted]w[redacted]d  1. Supervision of other normal pregnancy, antepartum -Taking valtrex for suppression, no recent outbreaks -  Culture, beta strep (group b only) - Cervicovaginal ancillary only  2. GDM, class A2 -Only able to recall her fasting BS this morning which was 107. States she ate rice & green beans at 1 am this morning.  -States she's taking her metformin as prescribed, 2 pills at night & 1 pill in the morning -CBG in office= 88  3. Poor compliance -issues with blood sugar logs  4. Vaginal discharge  - Cervicovaginal ancillary only  5. Vaginal yeast infection  - terconazole (TERAZOL 3) 0.8 % vaginal cream; Place 1 applicator vaginally at bedtime.  Dispense: 20 g; Refill: 0  Term labor symptoms and general obstetric precautions including but not limited to vaginal bleeding, contractions, leaking of fluid and fetal movement were reviewed in detail with the patient. Please refer to After Visit Summary for other counseling recommendations.  Return in about 1 week (around 10/18/2018) for as scheduled, High Risk OB.  Future Appointments  Date Time Provider Department Center  10/18/2018  9:45 AM [facility] [place] 2 [facility]  10/18/2018 11:15 AM Hermina Staggers, MD [facility]    Judeth Horn, NP

## 2018-10-11 NOTE — Progress Notes (Addendum)
Pt informed that the ultrasound is considered a limited OB ultrasound and is not intended to be a complete ultrasound exam.  Patient also informed that the ultrasound is not being completed with the intent of assessing for fetal or placental anomalies or any pelvic abnormalities.  Explained that the purpose of today's ultrasound is to assess for presentation, BPP and amniotic fluid volume.  Patient acknowledges the purpose of the exam and the limitations of the study.   Pt continues to have elevated PHQ-9 score. Behavioral Health Counselor was not available during pt's visit today.

## 2018-10-11 NOTE — Patient Instructions (Signed)
Gestational Diabetes Mellitus, Self Care When you have gestational diabetes (gestational diabetes mellitus), you must keep your blood sugar (glucose) under control. You can do this with:  Nutrition.  Exercise.  Lifestyle changes.  Medicines or insulin, if needed.  Support from your doctors and others.  How do I manage my blood sugar?  Check your blood sugar every day during pregnancy. Check it as often as told.  Call your doctor if your blood sugar is above your goal numbers for 2 tests in a row. Your doctor will set treatment goals for you. Try to have these blood sugars:  After not eating for a long time (after fasting): at or below 95 mg/dL (5.3 mmol/L).  After meals (postprandial): ? One hour after a meal: at or below 140 mg/dL (7.8 mmol/L). ? Two hours after a meal: at or below 120 mg/dL (6.7 mmol/L).  A1c (hemoglobin A1c) level: 6-6.5%.  What do I need to know about high blood sugar? High blood sugar is called hyperglycemia. Know the early signs of high blood sugar. Signs include:  Feeling: ? Thirsty. ? Hungry. ? Very tired.  Needing to pee (urinate) more than usual.  Blurry vision.  What do I need to know about low blood sugar? Low blood sugar is called hypoglycemia. This is when blood sugar is at or below 70 mg/dL (3.9 mmol/L). Symptoms may include:  Feeling: ? Hungry. ? Worried or nervous (anxious). ? Sweaty or clammy. ? Confused. ? Dizzy. ? Sleepy. ? Sick to your stomach (nauseous).  Having: ? A fast heartbeat. ? A headache. ? A change in vision. ? Jerky movements that you cannot control (seizure). ? Nightmares. ? Tingling or no feeling (numbness) around the mouth, lips, or tongue.  Having trouble with: ? Talking. ? Paying attention (concentrating). ? Moving (coordination). ? Sleeping.  Shaking.  Passing out (fainting).  Getting upset easily (irritability).  Treating low blood sugar  To treat low blood sugar, eat or drink something  sugary right away. If you can think clearly and swallow safely, follow the 15:15 rule:  Take 15 grams of a fast-acting carb (carbohydrate). Some fast-acting carbs are: ? 1 tube of glucose gel. ? 3 sugar tablets (glucose pills). ? 6-8 pieces of hard candy. ? 4 oz (120 mL) of fruit juice. ? 4 oz (120 mL) regular (not diet) soda.  Check your blood sugar 15 minutes after you take the carb.  If your blood sugar is still at or below 70 mg/dL (3.9 mmol/L), take 15 grams of a carb again.  If your blood sugar does not go above 70 mg/dL (3.9 mmol/L) after 3 tries, get help right away.  After your blood sugar goes back to normal, eat a meal or a snack within 1 hour.  Treating very low blood sugar If your blood sugar is at or below 54 mg/dL (3 mmol/L), you have very low blood sugar (severe hypoglycemia). This is an emergency. Do not wait to see if the symptoms will go away. Get medical help right away. Call your local emergency services (911 in the [place] Do not drive yourself to the hospital. If you have very low blood sugar and you cannot eat or drink, you may need a glucagon shot (injection). A family member or friend should learn:  How to check your blood sugar.  How to give you a glucagon shot.  Ask your doctor if you need a glucagon shot kit at home. What else is important to manage my diabetes? Medicine  Take your insulin and diabetes medicines as told.  Do not run out of insulin or medicines.  Adjust your insulin and diabetes medicines as told. Food   Make healthy food choices. These include: ? Chicken, fish, egg whites, and beans. ? Oats, whole wheat, bulgur, brown rice, quinoa, and millet. ? Fresh fruits and vegetables. ? Low-fat dairy products. ? Nuts, avocado, olive oil, and canola oil.  Make an eating plan. A food specialist (dietitian) can help you.  Follow instructions from your doctor about what you cannot eat or drink.  Drink enough fluid to keep your pee (urine)  clear or pale yellow.  Eat healthy snacks between healthy meals.  Keep track of carbs you eat. Read food labels. Learn about food serving sizes.  Follow your sick day plan when you cannot eat or drink normally. Make this plan with your doctor. Activity  Exercise 30 minutes or more a day or as much as told by your doctor.  Talk with your doctor if you start a new exercise. Your doctor may need to adjust your insulin, medicines, or food. Lifestyle  Do not drink alcohol.  Do not use any tobacco products. If you need help quitting, ask your doctor.  Learn how to deal with stress. If you need help with this, ask your doctor. Body care   Stay up to date with your shots (immunizations).  Brush your teeth and gums two times a day. Floss at least one time a day.  Go to the dentist least one time every 6 months.  Stay at a healthy weight while you are pregnant. General instructions  Take over-the-counter and prescription medicines only as told by your doctor.  Ask your doctor about risks of high blood pressure in pregnancy. These are called preeclampsia and eclampsia.  Share your diabetes care plan with: ? Your work or school. ? People you live with.  Check your pee for ketones: ? When you are sick. ? As told by your doctor.  Ask your doctor: ? Do I need to meet with a diabetes educator? ? Where can I find a support group for people with diabetes?  Carry a card or wear jewelry that says that you have diabetes.  Keep all follow-up visits with your doctor. This is important. Care after giving birth   Have your blood sugar checked 4-12 weeks after you give birth.  Get checked for diabetes at least every 3 years. Where to find more information: To learn more about diabetes, visit:  American Diabetes Association: www.diabetes.org/diabetes-basics/gestational  Centers for Disease Control and Prevention (CDC): http://sanchez-watson.com/.pdf  This  information is not intended to replace advice given to you by your health care provider. Make sure you discuss any questions you have with your health care provider. Document Released: 03/27/2016 Document Revised: 05/11/2016 Document Reviewed: 01/07/2016 Elsevier Interactive Patient Education  [employer].

## 2018-10-11 NOTE — Progress Notes (Signed)
Pt reports having increased amount of UC's and sharp abdominal pain.  Pt has [place] for growth and BPP @ [facility] on 11/1

## 2018-10-15 LAB — CULTURE, BETA STREP (GROUP B ONLY): Strep Gp B Culture: NEGATIVE

## 2018-10-15 LAB — CERVICOVAGINAL ANCILLARY ONLY
Bacterial vaginitis: NEGATIVE
Candida vaginitis: POSITIVE — AB
Chlamydia: NEGATIVE
Neisseria Gonorrhea: NEGATIVE
Trichomonas: NEGATIVE

## 2018-10-18 ENCOUNTER — Encounter: Payer: Self-pay | Admitting: Obstetrics and Gynecology

## 2018-10-18 ENCOUNTER — Telehealth (HOSPITAL_COMMUNITY): Payer: Self-pay | Admitting: *Deleted

## 2018-10-18 ENCOUNTER — Ambulatory Visit (HOSPITAL_COMMUNITY)
Admission: RE | Admit: 2018-10-18 | Discharge: 2018-10-18 | Disposition: A | Discharge status: Home / Self Care | Payer: Medicaid Other | Source: Ambulatory Visit | Attending: Obstetrics & Gynecology | Admitting: Obstetrics & Gynecology

## 2018-10-18 ENCOUNTER — Ambulatory Visit (INDEPENDENT_AMBULATORY_CARE_PROVIDER_SITE_OTHER): Payer: Medicaid Other | Admitting: Obstetrics and Gynecology

## 2018-10-18 ENCOUNTER — Other Ambulatory Visit: Payer: Self-pay | Admitting: Obstetrics and Gynecology

## 2018-10-18 ENCOUNTER — Encounter (HOSPITAL_COMMUNITY): Payer: Self-pay

## 2018-10-18 ENCOUNTER — Encounter (HOSPITAL_COMMUNITY): Payer: Self-pay | Admitting: *Deleted

## 2018-10-18 VITALS — BP 114/70 | HR 87 | Wt 154.0 lb

## 2018-10-18 DIAGNOSIS — O34219 Maternal care for unspecified type scar from previous cesarean delivery: Secondary | ICD-10-CM

## 2018-10-18 DIAGNOSIS — Z3480 Encounter for supervision of other normal pregnancy, unspecified trimester: Secondary | ICD-10-CM

## 2018-10-18 DIAGNOSIS — O24419 Gestational diabetes mellitus in pregnancy, unspecified control: Secondary | ICD-10-CM | POA: Diagnosis not present

## 2018-10-18 DIAGNOSIS — O98319 Other infections with a predominantly sexual mode of transmission complicating pregnancy, unspecified trimester: Secondary | ICD-10-CM

## 2018-10-18 DIAGNOSIS — O24415 Gestational diabetes mellitus in pregnancy, controlled by oral hypoglycemic drugs: Secondary | ICD-10-CM

## 2018-10-18 DIAGNOSIS — Z98891 History of uterine scar from previous surgery: Secondary | ICD-10-CM | POA: Diagnosis not present

## 2018-10-18 DIAGNOSIS — Z9119 Patient's noncompliance with other medical treatment and regimen: Secondary | ICD-10-CM | POA: Diagnosis not present

## 2018-10-18 DIAGNOSIS — Z3A37 37 weeks gestation of pregnancy: Secondary | ICD-10-CM

## 2018-10-18 DIAGNOSIS — Z362 Encounter for other antenatal screening follow-up: Secondary | ICD-10-CM | POA: Diagnosis not present

## 2018-10-18 DIAGNOSIS — O09213 Supervision of pregnancy with history of pre-term labor, third trimester: Secondary | ICD-10-CM

## 2018-10-18 DIAGNOSIS — A6009 Herpesviral infection of other urogenital tract: Secondary | ICD-10-CM | POA: Diagnosis not present

## 2018-10-18 DIAGNOSIS — Z91199 Patient's noncompliance with other medical treatment and regimen due to unspecified reason: Secondary | ICD-10-CM

## 2018-10-18 NOTE — Progress Notes (Signed)
Pt had [place] growth and BPP @ [facility] today. Pt states she has been having diarrhea and does not always take the Metformin as prescribed. Pt was offered [facility] today and declined, stating, "I'm doing better." pt advised that [facility] is available daily and she may call at any time to schedule appt.

## 2018-10-18 NOTE — Progress Notes (Signed)
Subjective:  Leah Walls is a 31 y.o. G7P5106 at [redacted]w[redacted]d being seen today for ongoing prenatal care.  She is currently monitored for the following issues for this high-risk pregnancy and has History of VBAC x4; Grand multiparity, antepartum; Breast lump on right side at 1 o'clock position; Marijuana use; Genital herpes affecting pregnancy, antepartum; Umbilical hernia; UTI (urinary tract infection) during pregnancy; Supervision of other normal pregnancy, antepartum; History of preterm delivery; and GDM, class A2 on their problem list.  Patient reports no complaints.  Contractions: Irregular. Vag. Bleeding: None.  Movement: Present. Denies leaking of fluid.   The following portions of the patient's history were reviewed and updated as appropriate: allergies, current medications, past family history, past medical history, past social history, past surgical history and problem list. Problem list updated.  Objective:   Vitals:   10/18/18 1117  BP: 114/70  Pulse: 87  Weight: 154 lb (69.9 kg)    Fetal Status: Fetal Heart Rate (bpm): NST   Movement: Present     General:  Alert, oriented and cooperative. Patient is in no acute distress.  Skin: Skin is warm and dry. No rash noted.   Cardiovascular: Normal heart rate noted  Respiratory: Normal respiratory effort, no problems with respiration noted  Abdomen: Soft, gravid, appropriate for gestational age. Pain/Pressure: Present     Pelvic:  Cervical exam deferred        Extremities: Normal range of motion.     Mental Status: Normal mood and affect. Normal behavior. Normal judgment and thought content.   Urinalysis:      Assessment and Plan:  Pregnancy: G7P5106 at [redacted]w[redacted]d  1. Supervision of other normal pregnancy, antepartum Stable Labor precautions  2. GDM, class A2 Pt not checking CBG's regular CBG's for the most part in goal range Importance of checking CBG's reviewed with pt Will continue with Metformin. BPP 10/10 today Growth scan  today Continue with antenatal testing IOL scheduled - Fetal nonstress test  3. Poor compliance See above - Fetal nonstress test  4. Genital herpes affecting pregnancy, antepartum On Valtrex suppression  5. History of VBAC x4 For TOLAC Consent signed  Term labor symptoms and general obstetric precautions including but not limited to vaginal bleeding, contractions, leaking of fluid and fetal movement were reviewed in detail with the patient. Please refer to After Visit Summary for other counseling recommendations.  Return in about 1 week (around 10/25/2018) for NST/BPP and HOB.   Hermina Staggers, MD

## 2018-10-18 NOTE — Patient Instructions (Signed)
Vaginal Delivery Vaginal delivery means that you will give birth by pushing your baby out of your birth canal (vagina). A team of health care providers will help you before, during, and after vaginal delivery. Birth experiences are unique for every woman and every pregnancy, and birth experiences vary depending on where you choose to give birth. What should I do to prepare for my baby's birth? Before your baby is born, it is important to talk with your health care provider about:  Your labor and delivery preferences. These may include: ? Medicines that you may be given. ? How you will manage your pain. This might include non-medical pain relief techniques or injectable pain relief such as epidural analgesia. ? How you and your baby will be monitored during labor and delivery. ? Who may be in the labor and delivery room with you. ? Your feelings about surgical delivery of your baby (cesarean delivery, or C-section) if this becomes necessary. ? Your feelings about receiving donated blood through an IV tube (blood transfusion) if this becomes necessary.  Whether you are able: ? To take pictures or videos of the birth. ? To eat during labor and delivery. ? To move around, walk, or change positions during labor and delivery.  What to expect after your baby is born, such as: ? Whether delayed umbilical cord clamping and cutting is offered. ? Who will care for your baby right after birth. ? Medicines or tests that may be recommended for your baby. ? Whether breastfeeding is supported in your hospital or birth center. ? How long you will be in the hospital or birth center.  How any medical conditions you have may affect your baby or your labor and delivery experience.  To prepare for your baby's birth, you should also:  Attend all of your health care visits before delivery (prenatal visits) as recommended by your health care provider. This is important.  Prepare your home for your baby's  arrival. Make sure that you have: ? Diapers. ? Baby clothing. ? Feeding equipment. ? Safe sleeping arrangements for you and your baby.  Install a car seat in your vehicle. Have your car seat checked by a certified car seat installer to make sure that it is installed safely.  Think about who will help you with your new baby at home for at least the first several weeks after delivery.  What can I expect when I arrive at the birth center or hospital? Once you are in labor and have been admitted into the hospital or birth center, your health care provider may:  Review your pregnancy history and any concerns you have.  Insert an IV tube into one of your veins. This is used to give you fluids and medicines.  Check your blood pressure, pulse, temperature, and heart rate (vital signs).  Check whether your bag of water (amniotic sac) has broken (ruptured).  Talk with you about your birth plan and discuss pain control options.  Monitoring Your health care provider may monitor your contractions (uterine monitoring) and your baby's heart rate (fetal monitoring). You may need to be monitored:  Often, but not continuously (intermittently).  All the time or for long periods at a time (continuously). Continuous monitoring may be needed if: ? You are taking certain medicines, such as medicine to relieve pain or make your contractions stronger. ? You have pregnancy or labor complications.  Monitoring may be done by:  Placing a special stethoscope or a handheld monitoring device on your abdomen to   check your baby's heartbeat, and feeling your abdomen for contractions. This method of monitoring does not continuously record your baby's heartbeat or your contractions.  Placing monitors on your abdomen (external monitors) to record your baby's heartbeat and the frequency and length of contractions. You may not have to wear external monitors all the time.  Placing monitors inside of your uterus  (internal monitors) to record your baby's heartbeat and the frequency, length, and strength of your contractions. ? Your health care provider may use internal monitors if he or she needs more information about the strength of your contractions or your baby's heart rate. ? Internal monitors are put in place by passing a thin, flexible wire through your vagina and into your uterus. Depending on the type of monitor, it may remain in your uterus or on your baby's head until birth. ? Your health care provider will discuss the benefits and risks of internal monitoring with you and will ask for your permission before inserting the monitors.  Telemetry. This is a type of continuous monitoring that can be done with external or internal monitors. Instead of having to stay in bed, you are able to move around during telemetry. Ask your health care provider if telemetry is an option for you.  Physical exam Your health care provider may perform a physical exam. This may include:  Checking whether your baby is positioned: ? With the head toward your vagina (head-down). This is most common. ? With the head toward the top of your uterus (head-up or breech). If your baby is in a breech position, your health care provider may try to turn your baby to a head-down position so you can deliver vaginally. If it does not seem that your baby can be born vaginally, your provider may recommend surgery to deliver your baby. In rare cases, you may be able to deliver vaginally if your baby is head-up (breech delivery). ? Lying sideways (transverse). Babies that are lying sideways cannot be delivered vaginally.  Checking your cervix to determine: ? Whether it is thinning out (effacing). ? Whether it is opening up (dilating). ? How low your baby has moved into your birth canal.  What are the three stages of labor and delivery?  Normal labor and delivery is divided into the following three stages: Stage 1  Stage 1 is the  longest stage of labor, and it can last for hours or days. Stage 1 includes: ? Early labor. This is when contractions may be irregular, or regular and mild. Generally, early labor contractions are more than 10 minutes apart. ? Active labor. This is when contractions get longer, more regular, more frequent, and more intense. ? The transition phase. This is when contractions happen very close together, are very intense, and may last longer than during any other part of labor.  Contractions generally feel mild, infrequent, and irregular at first. They get stronger, more frequent (about every 2-3 minutes), and more regular as you progress from early labor through active labor and transition.  Many women progress through stage 1 naturally, but you may need help to continue making progress. If this happens, your health care provider may talk with you about: ? Rupturing your amniotic sac if it has not ruptured yet. ? Giving you medicine to help make your contractions stronger and more frequent.  Stage 1 ends when your cervix is completely dilated to 4 inches (10 cm) and completely effaced. This happens at the end of the transition phase. Stage 2  Once   your cervix is completely effaced and dilated to 4 inches (10 cm), you may start to feel an urge to push. It is common for the body to naturally take a rest before feeling the urge to push, especially if you received an epidural or certain other pain medicines. This rest period may last for up to 1-2 hours, depending on your unique labor experience.  During stage 2, contractions are generally less painful, because pushing helps relieve contraction pain. Instead of contraction pain, you may feel stretching and burning pain, especially when the widest part of your baby's head passes through the vaginal opening (crowning).  Your health care provider will closely monitor your pushing progress and your baby's progress through the vagina during stage 2.  Your  health care provider may massage the area of skin between your vaginal opening and anus (perineum) or apply warm compresses to your perineum. This helps it stretch as the baby's head starts to crown, which can help prevent perineal tearing. ? In some cases, an incision may be made in your perineum (episiotomy) to allow the baby to pass through the vaginal opening. An episiotomy helps to make the opening of the vagina larger to allow more room for the baby to fit through.  It is very important to breathe and focus so your health care provider can control the delivery of your baby's head. Your health care provider may have you decrease the intensity of your pushing, to help prevent perineal tearing.  After delivery of your baby's head, the shoulders and the rest of the body generally deliver very quickly and without difficulty.  Once your baby is delivered, the umbilical cord may be cut right away, or this may be delayed for 1-2 minutes, depending on your baby's health. This may vary among health care providers, hospitals, and birth centers.  If you and your baby are healthy enough, your baby may be placed on your chest or abdomen to help maintain the baby's temperature and to help you bond with each other. Some mothers and babies start breastfeeding at this time. Your health care team will dry your baby and help keep your baby warm during this time.  Your baby may need immediate care if he or she: ? Showed signs of distress during labor. ? Has a medical condition. ? Was born too early (prematurely). ? Had a bowel movement before birth (meconium). ? Shows signs of difficulty transitioning from being inside the uterus to being outside of the uterus. If you are planning to breastfeed, your health care team will help you begin a feeding. Stage 3  The third stage of labor starts immediately after the birth of your baby and ends after you deliver the placenta. The placenta is an organ that develops  during pregnancy to provide oxygen and nutrients to your baby in the womb.  Delivering the placenta may require some pushing, and you may have mild contractions. Breastfeeding can stimulate contractions to help you deliver the placenta.  After the placenta is delivered, your uterus should tighten (contract) and become firm. This helps to stop bleeding in your uterus. To help your uterus contract and to control bleeding, your health care provider may: ? Give you medicine by injection, through an IV tube, by mouth, or through your rectum (rectally). ? Massage your abdomen or perform a vaginal exam to remove any blood clots that are left in your uterus. ? Empty your bladder by placing a thin, flexible tube (catheter) into your bladder. ? Encourage   you to breastfeed your baby. After labor is over, you and your baby will be monitored closely to ensure that you are both healthy until you are ready to go home. Your health care team will teach you how to care for yourself and your baby. This information is not intended to replace advice given to you by your health care provider. Make sure you discuss any questions you have with your health care provider. Document Released: 09/12/2008 Document Revised: 06/23/2016 Document Reviewed: 12/19/2015 Elsevier Interactive Patient Education  2018 [employer]  

## 2018-10-18 NOTE — Telephone Encounter (Signed)
Preadmission screen  

## 2018-10-23 ENCOUNTER — Encounter: Payer: Self-pay | Admitting: *Deleted

## 2018-10-25 ENCOUNTER — Ambulatory Visit: Payer: Medicaid Other | Admitting: *Deleted

## 2018-10-25 ENCOUNTER — Ambulatory Visit: Payer: Self-pay

## 2018-10-25 ENCOUNTER — Ambulatory Visit (INDEPENDENT_AMBULATORY_CARE_PROVIDER_SITE_OTHER): Payer: Medicaid Other | Admitting: Obstetrics & Gynecology

## 2018-10-25 VITALS — BP 103/60 | HR 100 | Wt 152.6 lb

## 2018-10-25 DIAGNOSIS — Z98891 History of uterine scar from previous surgery: Secondary | ICD-10-CM

## 2018-10-25 DIAGNOSIS — Z9119 Patient's noncompliance with other medical treatment and regimen: Secondary | ICD-10-CM

## 2018-10-25 DIAGNOSIS — Z91199 Patient's noncompliance with other medical treatment and regimen due to unspecified reason: Secondary | ICD-10-CM

## 2018-10-25 DIAGNOSIS — O24419 Gestational diabetes mellitus in pregnancy, unspecified control: Secondary | ICD-10-CM

## 2018-10-25 DIAGNOSIS — O219 Vomiting of pregnancy, unspecified: Secondary | ICD-10-CM

## 2018-10-25 DIAGNOSIS — O0993 Supervision of high risk pregnancy, unspecified, third trimester: Secondary | ICD-10-CM

## 2018-10-25 MED ORDER — PROMETHAZINE HCL 25 MG PO TABS: 25.0000 mg | ORAL_TABLET | Freq: Four times a day (QID) | ORAL | 0 refills | Status: DC | PRN

## 2018-10-25 NOTE — Progress Notes (Signed)

## 2018-10-25 NOTE — Progress Notes (Signed)
PRENATAL VISIT NOTE  Subjective:  Leah Walls is a 32 y.o. G7P5106 at [redacted]w[redacted]d being seen today for ongoing prenatal care.  She is currently monitored for the following issues for this high-risk pregnancy and has History of VBAC x4; Grand multiparity, antepartum; Breast lump on right side at 1 o'clock position; Marijuana use; Genital herpes affecting pregnancy, antepartum; Umbilical hernia; UTI (urinary tract infection) during pregnancy; Supervision of high-risk pregnancy; History of preterm delivery; and GDM, class A2 on their problem list.  Patient reports nausea and vomiting.  Contractions: Irregular. Vag. Bleeding: None.  Movement: Present. Denies leaking of fluid.   The following portions of the patient's history were reviewed and updated as appropriate: allergies, current medications, past family history, past medical history, past social history, past surgical history and problem list. Problem list updated.  Objective:   Vitals:   10/25/18 0946  BP: 103/60  Pulse: 100  Weight: 152 lb 9.6 oz (69.2 kg)    Fetal Status: Fetal Heart Rate (bpm): NST   Movement: Present     General:  Alert, oriented and cooperative. Patient is in no acute distress.  Skin: Skin is warm and dry. No rash noted.   Cardiovascular: Normal heart rate noted  Respiratory: Normal respiratory effort, no problems with respiration noted  Abdomen: Soft, gravid, appropriate for gestational age.  Pain/Pressure: Present     Pelvic: Cervical exam deferred        Extremities: Normal range of motion.     Mental Status: Normal mood and affect. Normal behavior. Normal judgment and thought content.   [place] Mfm Fetal Bpp Wo Non Stress  Result Date: 10/18/2018 ----------------------------------------------------------------------  OBSTETRICS REPORT                       (Signed Final 10/18/2018 02:16 pm) ---------------------------------------------------------------------- Patient Info  ID #:       [id number]                           D.O.B.:  1986-05-26 (32 yrs)  Name:       Leah Walls                  Visit Date: 10/18/2018 09:44 am ---------------------------------------------------------------------- Performed By  Performed By:     Tomma Lightning             Ref. Address:      [facility]                    RDMS,RVT                                                              OB[facility]Gyn Clinic                                                              [address]                                                              [place], [place]                                                              [address]  Attending:        Lin Landsman      Location:          [facility]                    MD  Referred By:      [facility] for                    [facility]                    Healthcare ---------------------------------------------------------------------- Orders   #  Description                          Code         Ordered By   1  [place] MFM FETAL BPP WO NON              76819.01     RAVI SHANKAR      STRESS   2  [place] MFM OB FOLLOW UP                  [id number]     RAVI [facility]  ----------------------------------------------------------------------   #  Order #                    Accession #                 Episode #   1  [id number]                  [id number]                  [id number]   2  [id number]                  [id number]                  [id number]  ---------------------------------------------------------------------- Indications   Encounter for other antenatal screening        Z36.2   follow-up   [redacted] weeks gestation of pregnancy                Z3A.37   Gestational diabetes in pregnancy,             O24.415   controlled by oral hypoglycemic drugs   (metformin)   Poor obstetric history: Previous preterm       O09.219   delivery, antepartum (36 weeks)   Previous cesarean delivery, antepartum         O34.219   ---------------------------------------------------------------------- Vital Signs  Weight (lb): 155                               Height:        4'11"  BMI:         31.3         Pulse:  95  BP:          118/64 ---------------------------------------------------------------------- Fetal Evaluation  Num Of Fetuses:          1  Fetal Heart Rate(bpm):   149  Cardiac Activity:        Observed  Presentation:            Cephalic  Placenta:                Fundal  P. Cord Insertion:       Previously Visualized  Amniotic Fluid  AFI FV:      Within normal limits  AFI Sum(cm)     %Tile       Largest Pocket(cm)  8.9             16          5.19  RUQ(cm)       RLQ(cm)       LUQ(cm)        LLQ(cm)  0             1.64          5.19           2.07 ---------------------------------------------------------------------- Biophysical Evaluation  Amniotic F.V:   Within normal limits       F. Tone:         Observed  F. Movement:    Observed                   Score:           8/8  F. Breathing:   Observed ---------------------------------------------------------------------- Biometry  BPD:      89.2  mm     G. Age:  36w 1d         31  %    CI:        76.46   %    70 - 86                                                          FL/HC:       20.1  %    20.8 - 22.6  HC:      323.2  mm     G. Age:  36w 4d         11  %    HC/AC:       0.96       0.92 - 1.05  AC:      335.6  mm     G. Age:  37w 3d         66  %    FL/BPD:      72.8  %    71 - 87  FL:       64.9  mm     G. Age:  33w 3d        < 3  %    FL/AC:  19.3  %    20 - 24  HUM:      61.1  mm     G. Age:  35w 3d         35  %  LV:        3.3  mm  Est. FW:    2888   gm     6 lb 6 oz     45  % ---------------------------------------------------------------------- OB History  Gravidity:    7         Term:   6        Prem:   0        SAB:   0  TOP:          0       Ectopic:  0        Living: 4 ---------------------------------------------------------------------- Gestational Age  LMP:            30w 6d        Date:  03/16/18                 EDD:   12/21/18  U/S Today:     35w 6d                                        EDD:   11/16/18  Best:          37w 3d     Det. ByMarcella Dubs         EDD:   11/05/18                                      (04/01/18) ---------------------------------------------------------------------- Anatomy  Cranium:               Appears normal         Aortic Arch:            Previously seen  Cavum:                 Previously seen        Ductal Arch:            Previously seen  Ventricles:            Appears normal         Diaphragm:              Appears normal  Choroid Plexus:        Previously seen        Stomach:                Appears normal, left                                                                        sided  Cerebellum:            Previously seen        Abdomen:                Previously seen  Posterior Fossa:  Previously seen        Abdominal Wall:         Previously seen  Nuchal Fold:           Not applicable (>20    Cord Vessels:           Previously seen                         wks GA)  Face:                  Orbits and profile     Kidneys:                Appear normal                         previously seen  Lips:                  Previously seen        Bladder:                Appears normal  Thoracic:              Appears normal         Spine:                  Previously seen  Heart:                 Appears normal         Upper Extremities:      Previously seen                         (4CH, axis, and situs  RVOT:                  Previously seen        Lower Extremities:      Previously seen  LVOT:                  Appears normal  Other:  Fetus appears to be a female.  Heels and 5th digit previously          visualized. ---------------------------------------------------------------------- Cervix Uterus Adnexa  Cervix  Not visualized (advanced GA >24wks) ---------------------------------------------------------------------- Impression  Normal  interval growth. ---------------------------------------------------------------------- Recommendations  Follow up as clinically indicated.  Continue weekly testing. ----------------------------------------------------------------------               Lin Landsman, MD Electronically Signed Final Report   10/18/2018 02:16 pm ----------------------------------------------------------------------  [place] Mfm Ob Follow Up  Result Date: 10/18/2018 ----------------------------------------------------------------------  OBSTETRICS REPORT                       (Signed Final 10/18/2018 02:16 pm) ---------------------------------------------------------------------- Patient Info  ID #:       [id number]                          D.O.B.:  [birth year] (32 yrs)  Name:       Leah Walls                  Visit Date: 10/18/2018 09:44 am ---------------------------------------------------------------------- Performed By  Performed By:     Tomma Lightning             Ref. Address:      [facility]  RDMS,RVT                                                              OB/Gyn Clinic                                                              [address]                                                              [place], [place]                                                              [address]  Attending:        Lin Landsman      Location:          [facility]                    MD  Referred By:      [facility] for                    [facility]                    Healthcare ---------------------------------------------------------------------- Orders   #  Description                          Code         Ordered By   1  [place] MFM FETAL BPP WO NON              19147.82     RAVI SHANKAR      STRESS   2  [place] MFM OB FOLLOW UP                  95621.30     RAVI [facility]   ----------------------------------------------------------------------   #  Order #                    Accession #                 Episode #   1  [id number]  [id number]                  [id number]   2  [id number]                  [id number]                  [id number]  ---------------------------------------------------------------------- Indications   Encounter for other antenatal screening        Z36.2   follow-up   [redacted] weeks gestation of pregnancy                Z3A.37   Gestational diabetes in pregnancy,             O24.415   controlled by oral hypoglycemic drugs   (metformin)   Poor obstetric history: Previous preterm       O09.219   delivery, antepartum (36 weeks)   Previous cesarean delivery, antepartum         O34.219  ---------------------------------------------------------------------- Vital Signs  Weight (lb): 155                               Height:        4'11"  BMI:         31.3         Pulse:  95  BP:          118/64 ---------------------------------------------------------------------- Fetal Evaluation  Num Of Fetuses:          1  Fetal Heart Rate(bpm):   149  Cardiac Activity:        Observed  Presentation:            Cephalic  Placenta:                Fundal  P. Cord Insertion:       Previously Visualized  Amniotic Fluid  AFI FV:      Within normal limits  AFI Sum(cm)     %Tile       Largest Pocket(cm)  8.9             16          5.19  RUQ(cm)       RLQ(cm)       LUQ(cm)        LLQ(cm)  0             1.64          5.19           2.07 ---------------------------------------------------------------------- Biophysical Evaluation  Amniotic F.V:   Within normal limits       F. Tone:         Observed  F. Movement:    Observed                   Score:           8/8  F. Breathing:   Observed ---------------------------------------------------------------------- Biometry  BPD:      89.2  mm     G. Age:  36w 1d         31  %    CI:        76.46   %    70 - 86  FL/HC:       20.1  %    20.8 - 22.6  HC:      323.2  mm     G. Age:  36w 4d         11  %    HC/AC:       0.96       0.92 - 1.05  AC:      335.6  mm     G. Age:  37w 3d         66  %    FL/BPD:      72.8  %    71 - 87  FL:       64.9  mm     G. Age:  33w 3d        < 3  %    FL/AC:       19.3  %    20 - 24  HUM:      61.1  mm     G. Age:  35w 3d         35  %  LV:        3.3  mm  Est. FW:    2888   gm     6 lb 6 oz     45  % ---------------------------------------------------------------------- OB History  Gravidity:    7         Term:   6        Prem:   0        SAB:   0  TOP:          0       Ectopic:  0        Living: 4 ---------------------------------------------------------------------- Gestational Age  LMP:           30w 6d        Date:  03/16/18                 EDD:   12/21/18  U/S Today:     35w 6d                                        EDD:   11/16/18  Best:          37w 3d     Det. By:  Marcella Dubs         EDD:   11/05/18                                      (04/01/18) ---------------------------------------------------------------------- Anatomy  Cranium:               Appears normal         Aortic Arch:            Previously seen  Cavum:                 Previously seen        Ductal Arch:            Previously seen  Ventricles:            Appears normal         Diaphragm:              Appears normal  Choroid Plexus:        Previously seen        Stomach:                Appears normal, left                                                                        sided  Cerebellum:            Previously seen        Abdomen:                Previously seen  Posterior Fossa:       Previously seen        Abdominal Wall:         Previously seen  Nuchal Fold:           Not applicable (>20    Cord Vessels:           Previously seen                         wks GA)  Face:                  Orbits and profile     Kidneys:                Appear normal                         previously seen   Lips:                  Previously seen        Bladder:                Appears normal  Thoracic:              Appears normal         Spine:                  Previously seen  Heart:                 Appears normal         Upper Extremities:      Previously seen                         (4CH, axis, and situs  RVOT:                  Previously seen        Lower Extremities:      Previously seen  LVOT:                  Appears normal  Other:  Fetus appears to be a female.  Heels and 5th digit previously          visualized. ---------------------------------------------------------------------- Cervix Uterus Adnexa  Cervix  Not visualized (advanced GA >24wks) ---------------------------------------------------------------------- Impression  Normal interval growth. ---------------------------------------------------------------------- Recommendations  Follow up as clinically indicated.  Continue weekly testing. ----------------------------------------------------------------------               Lin Landsman, MD Electronically Signed Final Report  10/18/2018 02:16 pm ----------------------------------------------------------------------  US Fetal Bpp W/nonstress  Result Date: 10/15/2018 ----------------------------------------------------------------------  OBSTETRICS REPORT                       (Signed Final 10/15/2018 01:10 pm) ---------------------------------------------------------------------- Patient Info  ID #:       [id number]                          D.O.B.:  07/27/1986 (32 yrs)  Name:       Leah Walls                  Visit Date: 10/11/2018 12:42 pm ---------------------------------------------------------------------- Performed By  Performed By:     Sedalia [facility]          Ref. Address:     [facility]                                                             [address]                                                             [place], [place]                                                             [address]  Attending:        Jaynie Collins         [facility]  Referred By:      [facility] for                    [facility]                    Healthcare ---------------------------------------------------------------------- Orders   #  Description                          Code         Ordered By   1  US FETAL BPP W/NONSTRESS             16109.6      Jaynie Collins  ----------------------------------------------------------------------   #  Order #                    Accession #                 Episode #   1  [id number]                  [id number]                  [id number]  ---------------------------------------------------------------------- Service(s) Provided   US Fetal BPP W NST                                   [phone]  ---------------------------------------------------------------------- Indications   [redacted] weeks gestation of pregnancy                Z3A.36   Gestational diabetes in pregnancy,             O24.415   controlled by oral hypoglycemic drugs  ---------------------------------------------------------------------- Fetal Evaluation  Num Of Fetuses:         1  Preg. Location:         Intrauterine  Cardiac Activity:       Observed  Presentation:           Cephalic  Amniotic Fluid  AFI FV:      Within normal limits  AFI Sum(cm)     %Tile       Largest Pocket(cm)  16.08           60          5.61  RUQ(cm)       RLQ(cm)       LUQ(cm)        LLQ(cm)  5.61          2.2           4.69           3.58 ---------------------------------------------------------------------- Biophysical Evaluation  Amniotic F.V:   Pocket => 2 cm two         F. Tone:        Observed                   planes  F. Movement:    Observed                   N.S.T:          Reactive  F. Breathing:   Observed  Score:          10/10 ---------------------------------------------------------------------- OB History  Gravidity:    7         Term:   6        Prem:   0        SAB:   0  TOP:          0       Ectopic:  0        Living: 4 ---------------------------------------------------------------------- Gestational Age  LMP:           29w 6d        Date:  03/16/18                 EDD:   12/21/18  Best:          Stevie Kern 3d     Det. ByMarcella Dubs         EDD:   11/05/18                                      (04/01/18) ---------------------------------------------------------------------- Impression  Normal amniotic fluid volume. Cephalic presentation.  Antenatal testing is reassuring with BPP10/10. ---------------------------------------------------------------------- Recommendations  Continue weekly antenatal testing till delivery. ----------------------------------------------------------------------               Jaynie Collins, MD Electronically Signed Final Report   10/15/2018 01:10 pm ----------------------------------------------------------------------   Assessment and Plan:  Pregnancy: [id number] at [redacted]w[redacted]d  1. GDM, class A2 2. History of VBAC x4 Blood sugars are within range.  NST performed today was reviewed and was found to be reactive. Subsequent BPP performed today was also reviewed and was found to be 10/10. AFI was also normal.IOL on 11/12. Will need postpartum 2 hr GTT.  3. Nausea and vomiting in pregnancy Phenergan prescribed as needed - promethazine (PHENERGAN) 25 MG tablet; Take 1 tablet (25 mg total) by mouth every 6 (six) hours as needed for nausea or vomiting.  Dispense: 30 tablet; Refill: 0  4. Supervision of high risk pregnancy in third trimester Term labor symptoms and general obstetric precautions including but not limited to vaginal bleeding, contractions, leaking of  fluid and fetal movement were reviewed in detail with the patient. Please refer to After Visit Summary for other counseling recommendations.  Return in about 7 weeks (around 12/13/2018) for  2 hr GTT and postpartum visit.  IOL on 11/12.  Future Appointments  Date Time Provider Department Center  10/29/2018  7:30 AM [facility] ROOM [facility] None    Jaynie Collins, MD

## 2018-10-25 NOTE — Patient Instructions (Addendum)
Return to clinic for any scheduled appointments or obstetric concerns, or go to [facility] for evaluation   Labor Induction Labor induction is when steps are taken to cause a pregnant woman to begin the labor process. Most women go into labor on their own between 37 weeks and 42 weeks of the pregnancy. When this does not happen or when there is a medical need, methods may be used to induce labor. Labor induction causes a pregnant woman's uterus to contract. It also causes the cervix to soften (ripen), open (dilate), and thin out (efface). Usually, labor is not induced before 39 weeks of the pregnancy unless there is a problem with the baby or mother. Before inducing labor, your health care provider will consider a number of factors, including the following:  The medical condition of you and the baby.  How many weeks along you are.  The status of the baby's lung maturity.  The condition of the cervix.  The position of the baby.  What are the reasons for labor induction? Labor may be induced for the following reasons:  The health of the baby or mother is at risk.  The pregnancy is overdue by 1 week or more.  The water breaks but labor does not start on its own.  The mother has a health condition or serious illness, such as high blood pressure, infection, placental abruption, or diabetes.  The amniotic fluid amounts are low around the baby.  The baby is distressed.  Convenience or wanting the baby to be born on a certain date is not a reason for inducing labor. What methods are used for labor induction? Several methods of labor induction may be used, such as:  Prostaglandin medicine. This medicine causes the cervix to dilate and ripen. The medicine will also start contractions. It can be taken by mouth or by inserting a suppository into the vagina.  Inserting a thin tube (catheter) with a balloon on the end into the vagina to dilate the cervix. Once inserted, the balloon is expanded  with water, which causes the cervix to open.  Stripping the membranes. Your health care provider separates amniotic sac tissue from the cervix, causing the cervix to be stretched and causing the release of a hormone called progesterone. This may cause the uterus to contract. It is often done during an office visit. You will be sent home to wait for the contractions to begin. You will then come in for an induction.  Breaking the water. Your health care provider makes a hole in the amniotic sac using a small instrument. Once the amniotic sac breaks, contractions should begin. This may still take hours to see an effect.  Medicine to trigger or strengthen contractions. This medicine is given through an IV access tube inserted into a vein in your arm.  All of the methods of induction, besides stripping the membranes, will be done in the hospital. Induction is done in the hospital so that you and the baby can be carefully monitored. How long does it take for labor to be induced? Some inductions can take up to 2-3 days. Depending on the cervix, it usually takes less time. It takes longer when you are induced early in the pregnancy or if this is your first pregnancy. If a mother is still pregnant and the induction has been going on for 2-3 days, either the mother will be sent home or a cesarean delivery will be needed. What are the risks associated with labor induction? Some of the risks   of induction include:  Changes in fetal heart rate, such as too high, too low, or erratic.  Fetal distress.  Chance of infection for the mother and baby.  Increased chance of having a cesarean delivery.  Breaking off (abruption) of the placenta from the uterus (rare).  Uterine rupture (very rare).  When induction is needed for medical reasons, the benefits of induction may outweigh the risks. What are some reasons for not inducing labor? Labor induction should not be done if:  It is shown that your baby does  not tolerate labor.  You have had previous surgeries on your uterus, such as a myomectomy or the removal of fibroids.  Your placenta lies very low in the uterus and blocks the opening of the cervix (placenta previa).  Your baby is not in a head-down position.  The umbilical cord drops down into the birth canal in front of the baby. This could cut off the baby's blood and oxygen supply.  You have had a previous cesarean delivery.  There are unusual circumstances, such as the baby being extremely premature.  This information is not intended to replace advice given to you by your health care provider. Make sure you discuss any questions you have with your health care provider. Document Released: 04/25/2007 Document Revised: 05/11/2016 Document Reviewed: 07/03/2013 Elsevier Interactive Patient Education  2017 Elsevier Inc.  

## 2018-10-25 NOTE — Progress Notes (Signed)
Pt reports having more frequent indigestion and vomiting after eating. She requests refill of phenergan.  Pt declined [facility] today - states she is just tired of being pregnant and is ready to give birth.  IOL scheduled on 11/12 @ 0730

## 2018-10-28 ENCOUNTER — Telehealth: Payer: Self-pay | Admitting: *Deleted

## 2018-10-28 NOTE — Telephone Encounter (Signed)
Called pt and discussed her concern.  She stated that she ran out of medication 2 days ago. She ran out of the metformin early and was not able to get a refill because the dosage had been increased but the pharmacy had not been informed. Pt is scheduled for IOL tomorrow @ 0730. Pt advised she will only need medication for today as she will likely not require it after delivery of baby. I called Rx to pharmacy for today's doses (3 tablets). Pt voiced understanding.

## 2018-10-28 NOTE — Telephone Encounter (Signed)
Received a voice message from 10/27/18 pm stating she ran out of her sugar medicine and is requesting a refill, requests a call back.

## 2018-10-29 ENCOUNTER — Inpatient Hospital Stay (HOSPITAL_COMMUNITY): Payer: Medicaid Other | Admitting: Anesthesiology

## 2018-10-29 ENCOUNTER — Encounter (HOSPITAL_COMMUNITY): Payer: Self-pay

## 2018-10-29 ENCOUNTER — Inpatient Hospital Stay (HOSPITAL_COMMUNITY)
Admission: RE | Admit: 2018-10-29 | Discharge: 2018-10-31 | DRG: 806 | Disposition: A | Discharge status: Home / Self Care | Payer: Medicaid Other | Attending: Family Medicine | Admitting: Family Medicine

## 2018-10-29 DIAGNOSIS — O34219 Maternal care for unspecified type scar from previous cesarean delivery: Secondary | ICD-10-CM | POA: Diagnosis present

## 2018-10-29 DIAGNOSIS — F1290 Cannabis use, unspecified, uncomplicated: Secondary | ICD-10-CM | POA: Diagnosis present

## 2018-10-29 DIAGNOSIS — Z3A39 39 weeks gestation of pregnancy: Secondary | ICD-10-CM

## 2018-10-29 DIAGNOSIS — O9852 Other viral diseases complicating childbirth: Secondary | ICD-10-CM

## 2018-10-29 DIAGNOSIS — O9832 Other infections with a predominantly sexual mode of transmission complicating childbirth: Secondary | ICD-10-CM | POA: Diagnosis present

## 2018-10-29 DIAGNOSIS — A6000 Herpesviral infection of urogenital system, unspecified: Secondary | ICD-10-CM | POA: Diagnosis present

## 2018-10-29 DIAGNOSIS — O24425 Gestational diabetes mellitus in childbirth, controlled by oral hypoglycemic drugs: Secondary | ICD-10-CM | POA: Diagnosis present

## 2018-10-29 DIAGNOSIS — O24429 Gestational diabetes mellitus in childbirth, unspecified control: Secondary | ICD-10-CM | POA: Diagnosis not present

## 2018-10-29 DIAGNOSIS — O99324 Drug use complicating childbirth: Secondary | ICD-10-CM | POA: Diagnosis present

## 2018-10-29 DIAGNOSIS — F1210 Cannabis abuse, uncomplicated: Secondary | ICD-10-CM

## 2018-10-29 DIAGNOSIS — O24419 Gestational diabetes mellitus in pregnancy, unspecified control: Secondary | ICD-10-CM | POA: Diagnosis present

## 2018-10-29 DIAGNOSIS — Z87891 Personal history of nicotine dependence: Secondary | ICD-10-CM | POA: Diagnosis not present

## 2018-10-29 LAB — CBC
HCT: 30.9 % — ABNORMAL LOW (ref 36.0–46.0)
Hemoglobin: 10.2 g/dL — ABNORMAL LOW (ref 12.0–15.0)
MCH: 30.1 pg (ref 26.0–34.0)
MCHC: 33 g/dL (ref 30.0–36.0)
MCV: 91.2 fL (ref 80.0–100.0)
Platelets: 191 10*3/uL (ref 150–400)
RBC: 3.39 MIL/uL — ABNORMAL LOW (ref 3.87–5.11)
RDW: 13.5 % (ref 11.5–15.5)
WBC: 7.8 10*3/uL (ref 4.0–10.5)
nRBC: 0 % (ref 0.0–0.2)

## 2018-10-29 LAB — GLUCOSE, CAPILLARY
Glucose-Capillary: 98 mg/dL (ref 70–99)
Glucose-Capillary: 64 mg/dL — ABNORMAL LOW (ref 70–99)

## 2018-10-29 LAB — RAPID URINE DRUG SCREEN, HOSP PERFORMED
Amphetamines: NOT DETECTED
Barbiturates: NOT DETECTED
Benzodiazepines: NOT DETECTED
Cocaine: NOT DETECTED
Opiates: NOT DETECTED
Tetrahydrocannabinol: POSITIVE — AB

## 2018-10-29 LAB — TYPE AND SCREEN
ABO/RH(D): O POS
Antibody Screen: NEGATIVE

## 2018-10-29 LAB — RPR: RPR Ser Ql: NONREACTIVE

## 2018-10-29 MED ORDER — EPHEDRINE 5 MG/ML INJ
10.0000 mg | INTRAVENOUS | Status: DC | PRN
  Filled 2018-10-29: qty 2

## 2018-10-29 MED ORDER — OXYTOCIN 40 UNITS IN LACTATED RINGERS INFUSION - SIMPLE MED
1.0000 m[IU]/min | INTRAVENOUS | Status: DC
  Administered 2018-10-29: 2 m[IU]/min via INTRAVENOUS
  Filled 2018-10-29: qty 1000

## 2018-10-29 MED ORDER — PRENATAL MULTIVITAMIN CH
1.0000 | ORAL_TABLET | Freq: Every day | ORAL | Status: DC
  Administered 2018-10-31: 1 via ORAL
  Filled 2018-10-29: qty 1

## 2018-10-29 MED ORDER — LIDOCAINE HCL (PF) 1 % IJ SOLN
INTRAMUSCULAR | Status: DC | PRN
  Administered 2018-10-29 (×2): 4 mL via EPIDURAL

## 2018-10-29 MED ORDER — COCONUT OIL OIL: 1.0000 "application " | TOPICAL_OIL | Status: DC | PRN

## 2018-10-29 MED ORDER — MISOPROSTOL 200 MCG PO TABS
800.0000 ug | ORAL_TABLET | Freq: Once | ORAL | Status: DC
  Filled 2018-10-29: qty 4

## 2018-10-29 MED ORDER — SENNOSIDES-DOCUSATE SODIUM 8.6-50 MG PO TABS
2.0000 | ORAL_TABLET | ORAL | Status: DC
  Administered 2018-10-30: 2 via ORAL
  Filled 2018-10-29 (×2): qty 2

## 2018-10-29 MED ORDER — OXYCODONE-ACETAMINOPHEN 5-325 MG PO TABS: 2.0000 | ORAL_TABLET | ORAL | Status: DC | PRN

## 2018-10-29 MED ORDER — ZOLPIDEM TARTRATE 5 MG PO TABS: 5.0000 mg | ORAL_TABLET | Freq: Every evening | ORAL | Status: DC | PRN

## 2018-10-29 MED ORDER — MISOPROSTOL 50MCG HALF TABLET: 50.0000 ug | ORAL_TABLET | ORAL | Status: DC | PRN

## 2018-10-29 MED ORDER — DIPHENHYDRAMINE HCL 50 MG/ML IJ SOLN: 12.5000 mg | INTRAMUSCULAR | Status: DC | PRN

## 2018-10-29 MED ORDER — SIMETHICONE 80 MG PO CHEW: 80.0000 mg | CHEWABLE_TABLET | ORAL | Status: DC | PRN

## 2018-10-29 MED ORDER — OXYCODONE-ACETAMINOPHEN 5-325 MG PO TABS: 1.0000 | ORAL_TABLET | Freq: Once | ORAL | Status: DC

## 2018-10-29 MED ORDER — LACTATED RINGERS IV SOLN: 500.0000 mL | Freq: Once | INTRAVENOUS | Status: DC

## 2018-10-29 MED ORDER — TERBUTALINE SULFATE 1 MG/ML IJ SOLN
0.2500 mg | Freq: Once | INTRAMUSCULAR | Status: DC | PRN
  Filled 2018-10-29: qty 1

## 2018-10-29 MED ORDER — SOD CITRATE-CITRIC ACID 500-334 MG/5ML PO SOLN: 30.0000 mL | ORAL | Status: DC | PRN

## 2018-10-29 MED ORDER — PHENYLEPHRINE 40 MCG/ML (10ML) SYRINGE FOR IV PUSH (FOR BLOOD PRESSURE SUPPORT)
80.0000 ug | PREFILLED_SYRINGE | INTRAVENOUS | Status: DC | PRN
  Filled 2018-10-29: qty 5
  Filled 2018-10-29: qty 10

## 2018-10-29 MED ORDER — LACTATED RINGERS IV SOLN: 500.0000 mL | INTRAVENOUS | Status: DC | PRN

## 2018-10-29 MED ORDER — ONDANSETRON HCL 4 MG/2ML IJ SOLN: 4.0000 mg | Freq: Four times a day (QID) | INTRAMUSCULAR | Status: DC | PRN

## 2018-10-29 MED ORDER — OXYCODONE-ACETAMINOPHEN 5-325 MG PO TABS
1.0000 | ORAL_TABLET | ORAL | Status: DC | PRN
  Administered 2018-10-29: 1 via ORAL
  Filled 2018-10-29: qty 1

## 2018-10-29 MED ORDER — WITCH HAZEL-GLYCERIN EX PADS: 1.0000 "application " | MEDICATED_PAD | CUTANEOUS | Status: DC | PRN

## 2018-10-29 MED ORDER — ONDANSETRON HCL 4 MG PO TABS: 4.0000 mg | ORAL_TABLET | ORAL | Status: DC | PRN

## 2018-10-29 MED ORDER — TETANUS-DIPHTH-ACELL PERTUSSIS 5-2.5-18.5 LF-MCG/0.5 IM SUSP: 0.5000 mL | Freq: Once | INTRAMUSCULAR | Status: DC

## 2018-10-29 MED ORDER — ONDANSETRON HCL 4 MG/2ML IJ SOLN: 4.0000 mg | INTRAMUSCULAR | Status: DC | PRN

## 2018-10-29 MED ORDER — DIBUCAINE 1 % RE OINT: 1.0000 "application " | TOPICAL_OINTMENT | RECTAL | Status: DC | PRN

## 2018-10-29 MED ORDER — ACETAMINOPHEN 325 MG PO TABS
650.0000 mg | ORAL_TABLET | ORAL | Status: DC | PRN
  Administered 2018-10-30 – 2018-10-31 (×6): 650 mg via ORAL
  Filled 2018-10-29 (×6): qty 2

## 2018-10-29 MED ORDER — LIDOCAINE HCL (PF) 1 % IJ SOLN
30.0000 mL | INTRAMUSCULAR | Status: DC | PRN
  Filled 2018-10-29: qty 30

## 2018-10-29 MED ORDER — DIPHENHYDRAMINE HCL 25 MG PO CAPS: 25.0000 mg | ORAL_CAPSULE | Freq: Four times a day (QID) | ORAL | Status: DC | PRN

## 2018-10-29 MED ORDER — LACTATED RINGERS IV SOLN
INTRAVENOUS | Status: DC
  Administered 2018-10-29 (×3): via INTRAVENOUS

## 2018-10-29 MED ORDER — PHENYLEPHRINE 40 MCG/ML (10ML) SYRINGE FOR IV PUSH (FOR BLOOD PRESSURE SUPPORT)
80.0000 ug | PREFILLED_SYRINGE | INTRAVENOUS | Status: DC | PRN
  Filled 2018-10-29: qty 5

## 2018-10-29 MED ORDER — ACETAMINOPHEN 325 MG PO TABS: 650.0000 mg | ORAL_TABLET | ORAL | Status: DC | PRN

## 2018-10-29 MED ORDER — OXYTOCIN BOLUS FROM INFUSION
500.0000 mL | Freq: Once | INTRAVENOUS | Status: AC
  Administered 2018-10-29: 500 mL via INTRAVENOUS

## 2018-10-29 MED ORDER — OXYTOCIN 40 UNITS IN LACTATED RINGERS INFUSION - SIMPLE MED: 2.5000 [IU]/h | INTRAVENOUS | Status: DC

## 2018-10-29 MED ORDER — MEASLES, MUMPS & RUBELLA VAC IJ SOLR
0.5000 mL | Freq: Once | INTRAMUSCULAR | Status: DC
  Filled 2018-10-29: qty 0.5

## 2018-10-29 MED ORDER — IBUPROFEN 600 MG PO TABS
600.0000 mg | ORAL_TABLET | Freq: Four times a day (QID) | ORAL | Status: DC
  Administered 2018-10-30 – 2018-10-31 (×6): 600 mg via ORAL
  Filled 2018-10-29 (×7): qty 1

## 2018-10-29 MED ORDER — BENZOCAINE-MENTHOL 20-0.5 % EX AERO: 1.0000 "application " | INHALATION_SPRAY | CUTANEOUS | Status: DC | PRN

## 2018-10-29 MED ORDER — FENTANYL 2.5 MCG/ML BUPIVACAINE 1/10 % EPIDURAL INFUSION (WH - ANES)
14.0000 mL/h | INTRAMUSCULAR | Status: DC | PRN
  Administered 2018-10-29: 12 mL/h via EPIDURAL
  Filled 2018-10-29: qty 100

## 2018-10-29 MED ORDER — TRANEXAMIC ACID-NACL 1000-0.7 MG/100ML-% IV SOLN
1000.0000 mg | INTRAVENOUS | Status: DC
  Filled 2018-10-29: qty 100

## 2018-10-29 NOTE — Lactation Note (Signed)
This note was copied from a baby's chart. Lactation Consultation Note  Patient Name: Leah Joannie SpringsSade Hazard [id number]'[id number]'s Date: 10/29/2018  [facility]C entered room, mom is eating dinner now. LC will return later to help with latch assistance.   Maternal Data    Feeding    LATCH Score Latch: Repeated attempts needed to sustain latch, nipple held in mouth throughout feeding, stimulation needed to elicit sucking reflex.  Audible Swallowing: Walls  Type of Nipple: Flat  Comfort (Breast/Nipple): Soft / non-tender  Hold (Positioning): Assistance needed to correctly position infant at breast and maintain latch.  LATCH Score: 5  Interventions    Lactation Tools Discussed/Used     Consult Status      Danelle EarthlyRobin Monty Spicher 10/29/2018, 9:36 PM

## 2018-10-29 NOTE — Anesthesia Procedure Notes (Signed)
Epidural Patient location during procedure: OB Start time: 10/29/2018 5:00 PM End time: 10/29/2018 5:03 PM  Staffing Anesthesiologist: Kaylyn LayerHowze, Kage Willmann E, MD Performed: anesthesiologist   Preanesthetic Checklist Completed: patient identified, pre-op evaluation, timeout performed, IV checked, risks and benefits discussed and monitors and equipment checked  Epidural Patient position: sitting Prep: site prepped and draped and DuraPrep Patient monitoring: continuous pulse ox, blood pressure, heart rate and cardiac monitor Approach: midline Location: L3-L4 Injection technique: LOR air  Needle:  Needle type: Tuohy  Needle gauge: 17 G Needle length: 9 cm Needle insertion depth: 5 cm Catheter type: closed end flexible Catheter size: 19 Gauge Catheter at skin depth: 10 cm Test dose: negative and Other (1% lidocaine)  Assessment Events: blood not aspirated, injection not painful, no injection resistance, negative IV test and no paresthesia  Additional Notes Patient identified. Risks, benefits, and alternatives discussed with patient including but not limited to bleeding, infection, nerve damage, paralysis, failed block, incomplete pain control, headache, blood pressure changes, nausea, vomiting, reactions to medication, itching, and postpartum back pain. Confirmed with bedside nurse the patient's most recent platelet count. Confirmed with patient that they are not currently taking any anticoagulation, have any bleeding history, or any family history of bleeding disorders. Patient expressed understanding and wished to proceed. All questions were answered. Sterile technique was used throughout the entire procedure. Please see nursing notes for vital signs. Crisp LOR on first pass. Test dose was given through epidural catheter and negative prior to continuing to dose epidural or start infusion. Warning signs of high block given to the patient including shortness of breath, tingling/numbness in  hands, complete motor block, or any concerning symptoms with instructions to call for help. Patient was given instructions on fall risk and not to get out of bed. All questions and concerns addressed with instructions to call with any issues or inadequate analgesia.  Reason for block:procedure for pain

## 2018-10-29 NOTE — Lactation Note (Signed)
This note was copied from a baby's chart. Lactation Consultation Note  Patient Name: Leah Walls Date: 10/29/2018 Reason for consult: Initial assessment;Term;Maternal endocrine disorder Type of Endocrine Disorder?: Diabetes  P7, 4 hour female infant, DAT+ BF concerns: Per mom, never latched other 6 children  due latch difficulties, gave other children pumped breast milk for one year.  Infant to sleepy and reluctant to latch at this time, 5 ml given to infant on spoon by nurse and infant took additional 5 ml by spoon from LC. Mom has high volume of colostrum and easily  hand expressed additional 10 ml of colostrum for future feeding. LC gave mom breast shells to wear and explained how to use.  LC notice mom has short shafted large nipples that are inverted. Mom was given harmony hand pump to pre-pump prior to latching infant to breast to help extended nipple shaft out more.  Mom will ask for assistance from Nurse or LC for next feeding.  LC discussed I & O. Reviewed Baby & Me book's Breastfeeding Basics.  Mom made aware of O/P services, breastfeeding support groups, community resources, and our phone # for post-discharge questions.  Mom's plan: 1. BF according hunger cues, 8 to 12 times within 24 hours including nights. 2. Mom will ask for assistance with next feeding in latching infant to breast. 3. Wear breast shells to help elongate nipple shaft out more. 4.  Will pre-pump to prior to latching infant to breast. 5.  Hand express and give additional EBM after latching infant to breast.   Maternal Data Formula Feeding for Exclusion: No Has patient been taught Hand Expression?: Yes(Mom hand expressed 20 ml of colostrum) Does the patient have breastfeeding experience prior to this delivery?: Yes  Feeding Feeding Type: Breast Fed  LATCH Score Latch: Too sleepy or reluctant, no latch achieved, no sucking elicited.  Audible Swallowing: None  Type of Nipple:  Inverted  Comfort (Breast/Nipple): Soft / non-tender  Hold (Positioning): Assistance needed to correctly position infant at breast and maintain latch.  LATCH Score: 3  Interventions Interventions: Breast feeding basics reviewed;Assisted with latch;Skin to skin;Hand express;Pre-pump if needed;Adjust position;Hand pump  Lactation Tools Discussed/Used Tools: Shells Shell Type: Inverted(short shafted) WIC Program: No Pump Review: Setup, frequency, and cleaning Initiated by:: Leah Walls, IBCLC Date initiated:: 10/29/18   Consult Status Consult Status: Follow-up Date: 10/30/18 Follow-up type: In-patient    Leah Walls 10/29/2018, 11:11 PM

## 2018-10-29 NOTE — H&P (Addendum)
LABOR AND DELIVERY ADMISSION HISTORY AND PHYSICAL NOTE  Leah Walls is a 32 y.o. female [phone] with IUP at [redacted]w[redacted]d presenting for IOL for A2GMD.  She reports positive fetal movement. She denies leakage of fluid or vaginal bleeding.  Prenatal History/Complications: PNC at [facility] Pregnancy complications:  - A2 GDM  - h/o VBAC x4 - grand multip  - marijuana use  - genital herpes (on valtrex suppression)  - UTI during pregnancy   Sono: @[redacted]w[redacted]d , CWD, normal anatomy, cephalic presentation, fundal placental lie, 2888g, 45% EFW  Past Medical History: Past Medical History:  Diagnosis Date  . Gestational diabetes   . Headache   . Hernia, abdominal 2011  . Supervision of other normal pregnancy, antepartum 05/17/2018    Nursing Staff Provider Office Location  [facility] Dating   Language  English Anatomy US  Normal female Flu Vaccine  Declined  Genetic Screen   NIPS: low risk  TDaP vaccine   08/27/18 Hgb A1C or  GTT Early  Third trimester: GDM  Rhogam  n/a   LAB RESULTS  Feeding Plan Breast Blood Type O/Positive/-- (05/31 1059) O pos Contraception nexplanon Antibody Negative (05/31 1059)neg Circumcision Female  Rub    Past Surgical History: Past Surgical History:  Procedure Laterality Date  . CESAREAN SECTION    . DILATION AND EVACUATION Bilateral 07/14/2015   Procedure: DILATATION AND EVACUATION;  Surgeon: Kathreen Cosier, MD;  Location: [facility];  Service: Gynecology;  Laterality: Bilateral;  . FOREARM FRACTURE SURGERY Right 2008  . WISDOM TOOTH EXTRACTION      Obstetrical History: OB History    Gravida  7   Para  6   Term  5   Preterm  1   AB  0   Living  6     SAB  0   TAB  0   Ectopic  0   Multiple  0   Live Births  4           Social History: Social History   Socioeconomic History  . Marital status: Single    Spouse name: Not on file  . Number of children: Not on file  . Years of education: Not on file  . Highest education level: Not on file   Occupational History  . Not on file  Social Needs  . Financial resource strain: Not hard at all  . Food insecurity:    Worry: Never true    Inability: Never true  . Transportation needs:    Medical: No    Non-medical: Not on file  Tobacco Use  . Smoking status: Former Smoker    Types: Cigarettes  . Smokeless tobacco: Never Used  Substance and Sexual Activity  . Alcohol use: No  . Drug use: No  . Sexual activity: Yes    Birth control/protection: None    Comment: implant removed July 2015 per pt.  Lifestyle  . Physical activity:    Days per week: Not on file    Minutes per session: Not on file  . Stress: Only a little  Relationships  . Social connections:    Talks on phone: Not on file    Gets together: Not on file    Attends religious service: Not on file    Active member of club or organization: Not on file    Attends meetings of clubs or organizations: Not on file    Relationship status: Not on file  Other Topics Concern  . Not on file  Social  History Narrative  . Not on file    Family History: Family History  Problem Relation Age of Onset  . Hypertension Mother   . Hypertension Maternal Grandmother   . Hypertension Paternal Grandmother     Allergies: No Known Allergies  Medications Prior to Admission  Medication Sig Dispense Refill Last Dose  . metFORMIN (GLUCOPHAGE) 500 MG tablet Take 2 tablet every night before bedtime 30 tablet 3 10/28/2018 at Unknown time  . omeprazole (PRILOSEC) 40 MG capsule TAKE 1 CAPSULE BY MOUTH EVERY DAY 30 capsule 2 10/28/2018 at Unknown time  . Prenat-FeAsp-Meth-FA-DHA w/o A (PRENATE PIXIE) 10-0.6-0.4-200 MG CAPS Take 1 tablet by mouth daily. 30 capsule 7 10/28/2018 at Unknown time  . promethazine (PHENERGAN) 25 MG tablet Take 1 tablet (25 mg total) by mouth every 6 (six) hours as needed for nausea or vomiting. 30 tablet 0 Past Month at Unknown time  . valACYclovir (VALTREX) 500 MG tablet Take 1 tablet (500 mg total) by mouth 2  (two) times daily. 90 tablet 1 10/28/2018 at Unknown time  . ACCU-CHEK FASTCLIX LANCETS MISC 1 Device by Percutaneous route 4 (four) times daily. 100 each 12 Taking  . docusate sodium (COLACE) 100 MG capsule Take 1 capsule (100 mg total) by mouth 2 (two) times daily as needed. (Patient not taking: Reported on 09/20/2018) 30 capsule 2 Not Taking at Unknown time  . glucose blood (ACCU-CHEK GUIDE) test strip Use as instructed QID 100 each 12 Taking  . nitrofurantoin, macrocrystal-monohydrate, (MACROBID) 100 MG capsule Take 1 capsule (100 mg total) by mouth at bedtime. (Patient not taking: Reported on 10/04/2018) 60 capsule 3 Not Taking at Unknown time     Review of Systems  All systems reviewed and negative except as stated in HPI  Physical Exam Blood pressure 99/63, pulse 97, resp. rate 18, height 4\' 11"  (1.499 m), weight 70.4 kg, last menstrual period 03/16/2018, not currently breastfeeding. General appearance: alert, oriented, NAD Lungs: normal respiratory effort Heart: regular rate Abdomen: soft, non-tender; gravid, FH appropriate for GA Extremities: No calf swelling or tenderness GU: no herpetic lesions  Presentation: cephalic (confirmed with [place]) Fetal monitoring: baseline 140, mod variability, +accel, -decel Uterine activity: irregular  Dilation: 1 Effacement (%): Thick Station: -3 Exam by:: Darin Engels, MD  Prenatal labs: ABO, Rh: O/Positive/-- (05/31 1059) Antibody: Negative (05/31 1059) Rubella: 2.47 (05/31 1059) RPR: Non Reactive (09/10 0846)  HBsAg: Negative (05/31 1059)  HIV: Non Reactive (09/10 0846)  GC/Chlamydia: Neg (10/25) GBS:   Neg (10/25) 2-hr GTT: 86/176/155 Genetic screening:  Low risk NIPS Anatomy US: normal female @[redacted]w[redacted]d   Prenatal Transfer Tool  Maternal Diabetes: Yes:  Diabetes Type:  Insulin/Medication controlled Genetic Screening: Normal Maternal Ultrasounds/Referrals: Normal Fetal Ultrasounds or other Referrals:  None Maternal Substance Abuse:  Yes:   Type: Marijuana Significant Maternal Medications:  None Significant Maternal Lab Results: Lab values include: Group B Strep negative  Results for orders placed or performed during the hospital encounter of 10/29/18 (from the past 24 hour(s))  CBC   Collection Time: 10/29/18  8:44 AM  Result Value Ref Range   WBC 7.8 4.0 - 10.5 K/uL   RBC 3.39 (L) 3.87 - 5.11 MIL/uL   Hemoglobin 10.2 (L) 12.0 - 15.0 g/dL   HCT 16.1 (L) 09.6 - 04.5 %   MCV 91.2 80.0 - 100.0 fL   MCH 30.1 26.0 - 34.0 pg   MCHC 33.0 30.0 - 36.0 g/dL   RDW 40.9 81.1 - 91.4 %   Platelets 191 150 -  400 K/uL   nRBC 0.0 0.0 - 0.2 %    Patient Active Problem List   Diagnosis Date Noted  . Gestational diabetes 10/29/2018  . GDM, class A2 08/29/2018  . History of preterm delivery 08/27/2018  . Supervision of high-risk pregnancy 05/17/2018  . UTI (urinary tract infection) during pregnancy 04/05/2018  . Umbilical hernia 11/15/2016  . Genital herpes affecting pregnancy, antepartum 10/18/2016  . Marijuana use 07/30/2016  . History of VBAC x4 07/24/2016  . Grand multiparity, antepartum 07/24/2016  . Breast lump on right side at 1 o'clock position 07/24/2016    Assessment: Leah Walls is a 32 y.o. [id number] at [redacted]w[redacted]d here for IOl for A2GDM  #Labor: IOL, will begin with low dose Pit given h/o Csection (up to 6). Will attempt FB once some cervical change.  #Pain: Per patient request, considering epidural  #FWB: Cat 1 #A2GDM: will monitor glucose q4hrs in latent labor, q2hrs active labor #ID:  GBS neg, HSV+ on valtrex suppression therapy  #MOF: breast  #H/o marijuana use: will obtain UDS #MOC:Nexplanon #Circ:  N/A  Oralia Manis, DO PGY-2 10/29/2018, 9:20 AM  OB FELLOW HISTORY AND PHYSICAL ATTESTATION  I have seen and examined this patient; I agree with above documentation in the resident's note.   Marcy Siren, D.O. OB Fellow  10/29/2018, 9:51 AM

## 2018-10-29 NOTE — Anesthesia Preprocedure Evaluation (Signed)
Anesthesia Evaluation  Patient identified by MRN, date of birth, ID band Patient awake    Reviewed: Allergy & Precautions, Patient's Chart, lab work & pertinent test results  History of Anesthesia Complications Negative for: history of anesthetic complications  Airway Mallampati: II  TM Distance: >3 FB Neck ROM: Full    Dental no notable dental hx. (+) Teeth Intact   Pulmonary former smoker,    Pulmonary exam normal breath sounds clear to auscultation       Cardiovascular negative cardio ROS Normal cardiovascular exam Rhythm:Regular Rate:Normal     Neuro/Psych negative neurological ROS  negative psych ROS   GI/Hepatic negative GI ROS, Neg liver ROS,   Endo/Other  diabetes, Gestational  Renal/GU negative Renal ROS  negative genitourinary   Musculoskeletal negative musculoskeletal ROS (+)   Abdominal   Peds negative pediatric ROS (+)  Hematology negative hematology ROS (+)   Anesthesia Other Findings   Reproductive/Obstetrics (+) Pregnancy Hx of C/S x1 with multiple subsequent NSVDs, hx of retained placenta/PPH                             Anesthesia Physical Anesthesia Plan  ASA: II  Anesthesia Plan: Epidural   Post-op Pain Management:    Induction:   PONV Risk Score and Plan: 2 and Treatment may vary due to age or medical condition  Airway Management Planned: Natural Airway  Additional Equipment:   Intra-op Plan:   Post-operative Plan:   Informed Consent: I have reviewed the patients History and Physical, chart, labs and discussed the procedure including the risks, benefits and alternatives for the proposed anesthesia with the patient or authorized representative who has indicated his/her understanding and acceptance.     Plan Discussed with: CRNA  Anesthesia Plan Comments:         Anesthesia Quick Evaluation

## 2018-10-29 NOTE — Progress Notes (Signed)
LABOR PROGRESS NOTE  Anders SimmondsSade I Libbey is a 32 y.o. [id number] at 9724w0d  admitted for IOL for A2GDM  Subjective: Patient reports she is feeling more pressure now. AROM with clear fluid   Objective: BP (!) 91/54   Pulse 78   Temp (!) 97.5 F (36.4 C) (Axillary)   Resp 16   Ht 4\' 11"  (1.499 m)   Wt 70.4 kg   LMP 03/16/2018 (Approximate)   SpO2 98%   BMI 31.35 kg/m  or  Vitals:   10/29/18 1715 10/29/18 1721 10/29/18 1725 10/29/18 1731  BP: 111/65 106/64 115/72 (!) 91/54  Pulse: 80 78 75 78  Resp: 18  16 16   Temp:      TempSrc:      SpO2: 98%     Weight:      Height:        Dilation: 7 Effacement (%): 70 Station: -1 Presentation: Vertex Exam by:: Dr. Wilburt FinlayAbraham FHT: baseline rate 130, moderate varibility, +acel, +early decel Toco: irregular, q2-163min  Labs: Lab Results  Component Value Date   WBC 7.8 10/29/2018   HGB 10.2 (L) 10/29/2018   HCT 30.9 (L) 10/29/2018   MCV 91.2 10/29/2018   PLT 191 10/29/2018    Patient Active Problem List   Diagnosis Date Noted  . Gestational diabetes 10/29/2018  . GDM, class A2 08/29/2018  . History of preterm delivery 08/27/2018  . Supervision of high-risk pregnancy 05/17/2018  . UTI (urinary tract infection) during pregnancy 04/05/2018  . Umbilical hernia 11/15/2016  . Genital herpes affecting pregnancy, antepartum 10/18/2016  . Marijuana use 07/30/2016  . History of VBAC x4 07/24/2016  . Grand multiparity, antepartum 07/24/2016  . Breast lump on right side at 1 o'clock position 07/24/2016    Assessment / Plan: 32 y.o. [id number] at 5524w0d here for IOL for A2GDM  Labor: IOL, doing well on Pitocin. AROM  Fetal Wellbeing:  Cat 1 Pain Control:  epidural  Anticipated MOD:  SVD H/o maternal drug use: UDS positive for THC, CSW consult placed  A2GDM: last CBG 64, juice given. Will continue q4h checks, q2h during active labor   Oralia ManisSherin Emric Kowalewski, DO PGY-2 10/29/2018, 6:04 PM

## 2018-10-29 NOTE — Anesthesia Pain Management Evaluation Note (Signed)
  CRNA Pain Management Visit Note  Patient: Leah Walls, 32 y.o., female  "Hello I am a member of the anesthesia team at [facility]. We have an anesthesia team available at all times to provide care throughout the hospital, including epidural management and anesthesia for C-section. I don't know your plan for the delivery whether it a natural birth, water birth, IV sedation, nitrous supplementation, doula or epidural, but we want to meet your pain goals."   1.Was your pain managed to your expectations on prior hospitalizations?   Yes   2.What is your expectation for pain management during this hospitalization?     Epidural  3.How can we help you reach that goal? epidural  Record the patient's initial score and the patient's pain goal.   Pain: 0  Pain Goal: 5 [facility] wants you to be able to say your pain was always managed very well.  Shanaiya Bene 10/29/2018

## 2018-10-29 NOTE — Progress Notes (Signed)
LABOR PROGRESS NOTE  Leah Walls is a 32 y.o. [id number] at 5878w0d  admitted for IOl for A2GDM  Subjective: Patient reports she now feels her contractions more. Otherwise doing well.   Objective: BP 118/68   Pulse 75   Temp (!) 97.3 F (36.3 C) (Axillary)   Resp 18   Ht 4\' 11"  (1.499 m)   Wt 70.4 kg   LMP 03/16/2018 (Approximate)   BMI 31.35 kg/m  or  Vitals:   10/29/18 1303 10/29/18 1340 10/29/18 1405 10/29/18 1432  BP: 111/64 104/61 (!) 109/57 118/68  Pulse: 84 73 79 75  Resp: 20 18 20 18   Temp:      TempSrc:      Weight:      Height:        Dilation: 2.5 Effacement (%): 50 Station: Ballotable Presentation: Vertex Exam by:: Dr. Wilburt FinlayAbraham FHT: baseline rate 140, moderate varibility, +acel, -decel Toco: irregular, q2-153min  Labs: Lab Results  Component Value Date   WBC 7.8 10/29/2018   HGB 10.2 (L) 10/29/2018   HCT 30.9 (L) 10/29/2018   MCV 91.2 10/29/2018   PLT 191 10/29/2018    Patient Active Problem List   Diagnosis Date Noted  . Gestational diabetes 10/29/2018  . GDM, class A2 08/29/2018  . History of preterm delivery 08/27/2018  . Supervision of high-risk pregnancy 05/17/2018  . UTI (urinary tract infection) during pregnancy 04/05/2018  . Umbilical hernia 11/15/2016  . Genital herpes affecting pregnancy, antepartum 10/18/2016  . Marijuana use 07/30/2016  . History of VBAC x4 07/24/2016  . Grand multiparity, antepartum 07/24/2016  . Breast lump on right side at 1 o'clock position 07/24/2016    Assessment / Plan: 32 y.o. G7P5106 at 7278w0d here for IOL for A2GDM  Labor: IOL, doing well on Pitocin. No need for FB at this time  Fetal Wellbeing:  Cat 1 Pain Control:  Per patient request, desires epidural  Anticipated MOD:  SVD H/o maternal drug use: UDS positive for THC, CSW consult placed  A2GDM: last CBG 98, will continue q4h checks, q2h during active labor   Leah ManisSherin Telecia Larocque, DO PGY-2 10/29/2018, 2:37 PM

## 2018-10-30 LAB — BIRTH TISSUE RECOVERY COLLECTION (PLACENTA DONATION)

## 2018-10-30 LAB — GLUCOSE, CAPILLARY: Glucose-Capillary: 74 mg/dL (ref 70–99)

## 2018-10-30 MED ORDER — OXYCODONE HCL 5 MG PO TABS
5.0000 mg | ORAL_TABLET | ORAL | Status: AC | PRN
  Administered 2018-10-30 (×2): 5 mg via ORAL
  Filled 2018-10-30 (×2): qty 1

## 2018-10-30 NOTE — Anesthesia Postprocedure Evaluation (Signed)
Anesthesia Post Note  Patient: Anders SimmondsSade I Hashem  Procedure(s) Performed: AN AD HOC LABOR EPIDURAL     Patient location during evaluation: Mother Baby Anesthesia Type: Epidural Level of consciousness: awake and alert Pain management: pain level controlled Vital Signs Assessment: post-procedure vital signs reviewed and stable Respiratory status: spontaneous breathing, nonlabored ventilation and respiratory function stable Cardiovascular status: stable Postop Assessment: no headache, no backache, epidural receding, no apparent nausea or vomiting, patient able to bend at knees, adequate PO intake and able to ambulate Anesthetic complications: no    Last Vitals:  Vitals:   10/30/18 0122 10/30/18 0445  BP: 104/70 95/62  Pulse: 69 63  Resp:    Temp: 36.9 C 36.9 C  SpO2: 99% 100%    Last Pain:  Vitals:   10/30/18 0925  TempSrc:   PainSc: 9    Pain Goal: Patients Stated Pain Goal: 8 (10/29/18 1303)               Laban EmperorMalinova,Zamya Culhane Hristova

## 2018-10-30 NOTE — Discharge Instructions (Signed)

## 2018-10-30 NOTE — Clinical Social Work Maternal (Signed)
CLINICAL SOCIAL WORK MATERNAL/CHILD NOTE  Patient Details  Name: Anajah I Huegel MRN: [id number] Date of Birth: 04/19/1986  Date:  10/30/2018  Clinical Social Worker Initiating Note:  Haygen Zebrowski Boyd-Gilyard Date/Time: Initiated:  10/30/18/1246     Child's Name:  Summer Lowe   Biological Parents:  Mother(MOB refused to provide any information to about FOB. )   Need for Interpreter:  None   Reason for Referral:  Current Substance Use/Substance Use During Pregnancy (hx of marijuana use. )   Address:  [address] [place] [address]    Phone number:  [phone] (home)     Additional phone number:   Household Members/Support Persons (HM/SP):   Household Member/Support Person 1, Household Member/Support Person 2, Household Member/Support Person 3(MOB has a total of 7 children that lives in MOB's home(see below; Boy 15, Boy 13, and Boy 11).)   HM/SP Name Relationship DOB or Age  HM/SP -1 Clark Lowe son 02/06/27  HM/SP -2 Sanaa Lowe daughter 07/07/15  HM/SP -3 Elizabeth Speciale daughter 03/11/09  HM/SP -4        HM/SP -5        HM/SP -6        HM/SP -7        HM/SP -8          Natural Supports (not living in the home):  Parent, Immediate Family   Professional Supports: Case Manager/Social Worker(MOB has a Baby Love worker, but is unsure of her name. )   Employment: Unemployed   Type of Work:     Education:  9 to 11 years   Homebound arranged: No  Financial Resources:  Medicaid   Other Resources:  WIC, Food Stamps    Cultural/Religious Considerations Which May Impact Care:  Per MOB's Face Sheet, MOB is Christian.   Strengths:  Ability to meet basic needs , Pediatrician chosen, Home prepared for child    Psychotropic Medications:         Pediatrician:    [place] area  Pediatrician List:   [facility]  [place]    [place]    [place]    [place]    [place]      Pediatrician Fax Number:    Risk  Factors/Current Problems:  Substance Use    Cognitive State:  Able to Concentrate , Alert , Linear Thinking    Mood/Affect:  Apprehensive , Agitated , Comfortable    CSW Assessment: CSW met with MOB at bedside to complete assessment for consult regarding hx of THC use. Upon this writers arrival, MOB was resting in bed and MOB's niece was on her phone on the couch. With MOB's permission, CSW asked MOB's aunt to leave the room in orde to meet with MOB in private. MOB appeared irritated and not interested in meeting with CSW as evidence by her lack of eye contact and her straight forward responses to CSW.   This writer inquired about substance use hx. MOB noted she used THC regularly to increase with her daily appetite. This writer educated MOB on the hospitals policy and procedure regarding substance use and mandated reporting for positive screens. MOB verbalized understanding. This writer informed MOB that if UDS or CDS come back positive she will be notified and a report will be made to [place] DSS-CPS. MOB notes she has no further needs and has everything she needs for baby's arrival home to include car seat, crib and clothing items. At this time, CSW has no   barriers to d/c at this time  CSW Plan/Description:  No Further Intervention Required/No Barriers to Discharge, Perinatal Mood and Anxiety Disorder (PMADs) Education, Other Patient/Family Education, Other Information/Referral to [employer], CSW Will Continue to Monitor Umbilical Cord Tissue Drug Screen Results and Make Report if Vil[place], MSW, LCSW Clinical Social Work [phone]  Dimple Nanas, LCSW 07/12/18, 2:54 PM

## 2018-10-30 NOTE — Progress Notes (Addendum)
POSTPARTUM PROGRESS NOTE  Post Partum Day 1  Subjective:  Leah Walls is a 32 y.o. [id number] s/p VBAC at 4457w1d.  She reports she is doing well. No acute events overnight. She denies any problems with ambulating, voiding or po intake. Reports gas but no BM. Denies nausea or vomiting.  Pain is moderately controlled.  Lochia is light.  Objective: Blood pressure 95/62, pulse 63, temperature 98.5 F (36.9 C), temperature source Oral, resp. rate 17, height 4\' 11"  (1.499 m), weight 70.4 kg, last menstrual period 03/16/2018, SpO2 100 %, not currently breastfeeding.  Physical Exam:  General: alert, cooperative and no distress Chest: no respiratory distress Heart:regular rate, distal pulses intact Abdomen: soft, nontender,  Uterine Fundus: firm, appropriately tender DVT Evaluation: No calf swelling or tenderness Extremities: no edema Skin: warm, dry  Recent Labs    10/29/18 0844  HGB 10.2*  HCT 30.9*    Assessment/Plan: Leah Walls is a 32 y.o. [id number] s/p VBAC at 6257w1d   PPD#1 - Doing well  Routine postpartum care  Contraception: Nexplanon Feeding: breast  Dispo: Plan for discharge 11/13 vs 11/14 pending CSW evaluation.   LOS: 1 day   Oralia ManisSherin Abraham, DO PGY-2 10/30/2018, 7:42 AM   OB FELLOW POSTPARTUM PROGRESS NOTE ATTESTATION  I have seen and examined this patient and agree with above documentation in the student's note.   Gwenevere AbbotNimeka Artesia Berkey, MD OB Fellow  10/30/2018, 8:22 PM

## 2018-10-31 ENCOUNTER — Encounter (HOSPITAL_COMMUNITY): Payer: Self-pay | Admitting: *Deleted

## 2018-10-31 LAB — GLUCOSE, CAPILLARY: Glucose-Capillary: 106 mg/dL — ABNORMAL HIGH (ref 70–99)

## 2018-10-31 MED ORDER — IBUPROFEN 600 MG PO TABS: 600.0000 mg | ORAL_TABLET | Freq: Four times a day (QID) | ORAL | 0 refills | Status: DC

## 2018-10-31 NOTE — Lactation Note (Signed)
This note was copied from a baby's chart. Lactation Consultation Note; Mom is P7 and  reports this baby latched better than her other babies and she has been nursing her some. Reports her breasts feel a little fuller this morning but when she pumped she only obtained a few ml's. Has her own Medela Sonata pump and wants assist with it. Also asking for larger flanges. #30 flanges given, but they were too small. #36 flanges given. Reviewed setup, use and cleaning of pump pieces. Mom reports that size feels much better. Mom pumping as I left room. States she still plans to put the baby to breast some. No questions at present. Reviewed our phone number to call with questions/concerns  Patient Name: Leah Walls ZOXWR'UToday's Date: 10/31/2018 Reason for consult: Follow-up assessment   Maternal Data Formula Feeding for Exclusion: No Has patient been taught Hand Expression?: Yes Does the patient have breastfeeding experience prior to this delivery?: Yes  Feeding Feeding Type: Bottle Fed - Formula Nipple Type: Regular  LATCH Score                   Interventions    Lactation Tools Discussed/Used WIC Program: No   Consult Status Consult Status: Complete    Pamelia HoitWeeks, Cataleah Stites D 10/31/2018, 10:40 AM

## 2018-10-31 NOTE — Discharge Summary (Addendum)
Postpartum Discharge Summary     Patient Name: Leah Walls DOB: 15-Jun-1986 MRN: [id number]  Date of admission: 10/29/2018 Delivering Provider: Burman Nieves A   Date of discharge: 10/31/2018  Admitting diagnosis: 39 wks induction Intrauterine pregnancy: [redacted]w[redacted]d     Secondary diagnosis:  Principal Problem:   VBAC, delivered Active Problems:   Gestational diabetes  Additional problems:  - A2 GDM  - h/o VBAC x4 - grand multip  - marijuana use  - genital herpes (on valtrex suppression)  - UTI during pregnancy      Discharge diagnosis: Term Pregnancy Delivered, VBAC and GDM A2                                                                                                Post partum procedures:none  Augmentation: AROM and Pitocin  Complications: None  Hospital course:  Induction of Labor With Vaginal Delivery   32 y.o. yo [phone] at [redacted]w[redacted]d was admitted to the hospital 10/29/2018 for induction of labor.  Indication for induction: A2 DM.  Patient had an uncomplicated labor course as follows: Membrane Rupture Time/Date: 5:56 PM ,10/29/2018   Intrapartum Procedures: Episiotomy: None [1]                                         Lacerations:  None [1]  Patient had delivery of a Viable infant.  Information for the patient's newborn:  Lilas, Diefendorf [[id number]]  Delivery Method: VBAC, Spontaneous(Filed from Delivery Summary)   10/29/2018  Details of delivery can be found in separate delivery note.  Patient had a routine postpartum course. She is pumping breastmilk without difficulty. She is worried about getting a carseat, and social work will see her prior to discharge. Patient is discharged home 10/31/18.  Magnesium Sulfate recieved: No BMZ received: No  Physical exam  Vitals:   10/30/18 0445 10/30/18 1437 10/30/18 2217 10/31/18 0542  BP: 95/62 108/70 103/64 109/84  Pulse: 63 75 69 63  Resp:  18 18   Temp: 98.5 F (36.9 C)  (!) 97.5 F (36.4 C) (!) 97.5 F (36.4  C)  TempSrc: Oral  Oral Oral  SpO2: 100% 100%  100%  Weight:      Height:       General: alert, cooperative and no distress Lochia: appropriate Uterine Fundus: firm Incision: N/A DVT Evaluation: No evidence of DVT seen on physical exam. No cords or calf tenderness. No significant calf/ankle edema. Labs: Lab Results  Component Value Date   WBC 7.8 10/29/2018   HGB 10.2 (L) 10/29/2018   HCT 30.9 (L) 10/29/2018   MCV 91.2 10/29/2018   PLT 191 10/29/2018   CMP Latest Ref Rng & Units 07/14/2015  Glucose 65 - 99 mg/dL 130(Q)  BUN 6 - 20 mg/dL 7  Creatinine 6.57 - 8.46 mg/dL 9.62  Sodium 952 - 841 mmol/L 140  Potassium 3.5 - 5.1 mmol/L 3.2(L)  Chloride 101 - 111 mmol/L 113(H)  CO2 22 - 32 mmol/L 22  Calcium 8.9 -  10.3 mg/dL 8.1(L)  Total Protein 6.5 - 8.1 g/dL 6.3(L)  Total Bilirubin 0.3 - 1.2 mg/dL 0.5  Alkaline Phos 38 - 126 U/L 105  AST 15 - 41 U/L 15  ALT 14 - 54 U/L 10(L)    Discharge instruction: per After Visit Summary and "Baby and Me Booklet".  After visit meds:  Allergies as of 10/31/2018   No Known Allergies     Medication List    STOP taking these medications   docusate sodium 100 MG capsule Commonly known as:  COLACE   metFORMIN 500 MG tablet Commonly known as:  GLUCOPHAGE   nitrofurantoin (macrocrystal-monohydrate) 100 MG capsule Commonly known as:  MACROBID     TAKE these medications   ACCU-CHEK FASTCLIX LANCETS Misc 1 Device by Percutaneous route 4 (four) times daily.   glucose blood test strip Use as instructed QID   ibuprofen 600 MG tablet Commonly known as:  ADVIL,MOTRIN Take 1 tablet (600 mg total) by mouth every 6 (six) hours.   omeprazole 40 MG capsule Commonly known as:  PRILOSEC TAKE 1 CAPSULE BY MOUTH EVERY DAY   PRENATE PIXIE 10-0.6-0.4-200 MG Caps Take 1 tablet by mouth daily.   promethazine 25 MG tablet Commonly known as:  PHENERGAN Take 1 tablet (25 mg total) by mouth every 6 (six) hours as needed for nausea or  vomiting.   valACYclovir 500 MG tablet Commonly known as:  VALTREX Take 1 tablet (500 mg total) by mouth 2 (two) times daily.       Diet: routine diet  Activity: Advance as tolerated. Pelvic rest for 6 weeks.   Outpatient follow up:4 weeks Follow up Appt: Future Appointments  Date Time Provider Department Center  12/05/2018  8:20 AM [facility] LAB [facility] W[facility]  12/05/2018  9:00 AM [facility]-BEHAVIORAL HEALTH CLINICIAN [facility]  12/05/2018  9:35 AM Rasch, Harolyn RutherfordJennifer I, NP [facility]   Follow up Visit: Follow-up Information    [facility]   Specialty:  Obstetrics and Gynecology Why:  @9 :35AM Contact information: [address] [place] [id number] [phone]         Please schedule this patient for Postpartum visit in: 4 weeks with the following provider: MD For C/S patients schedule nurse incision check in weeks 2 weeks: no High risk pregnancy complicated by: GDM Delivery mode:  SVD Anticipated Birth Control:  Nexplanon PP Procedures needed: 2 hour GTT  Schedule Integrated BH visit: yes  Newborn Data: Live born female  Birth Weight: 6 lb 11.9 oz (3059 g) APGAR: 7, 8  Newborn Delivery   Birth date/time:  10/29/2018 18:14:00 Delivery type:  VBAC, Spontaneous     Baby Feeding: Breast Disposition:home with mother   10/31/2018 Oralia ManisSherin Abraham, DO  PGY-2  Attestation: I have seen this patient and agree with the resident's documentation. I have examined them separately, and we have discussed the plan of care.  Cristal DeerLaurel S. Earlene PlaterWallace, DO OB/GYN Fellow

## 2018-11-04 ENCOUNTER — Encounter: Payer: Self-pay | Admitting: Family Medicine

## 2018-12-04 ENCOUNTER — Other Ambulatory Visit: Payer: Self-pay | Admitting: *Deleted

## 2018-12-04 DIAGNOSIS — O24429 Gestational diabetes mellitus in childbirth, unspecified control: Secondary | ICD-10-CM

## 2018-12-05 ENCOUNTER — Ambulatory Visit: Payer: Self-pay | Admitting: Clinical

## 2018-12-05 ENCOUNTER — Other Ambulatory Visit: Payer: Medicaid Other

## 2018-12-05 ENCOUNTER — Ambulatory Visit (INDEPENDENT_AMBULATORY_CARE_PROVIDER_SITE_OTHER): Payer: Medicaid Other | Admitting: Obstetrics and Gynecology

## 2018-12-05 ENCOUNTER — Ambulatory Visit: Payer: Self-pay | Admitting: Obstetrics & Gynecology

## 2018-12-05 DIAGNOSIS — O24419 Gestational diabetes mellitus in pregnancy, unspecified control: Secondary | ICD-10-CM

## 2018-12-05 DIAGNOSIS — R030 Elevated blood-pressure reading, without diagnosis of hypertension: Secondary | ICD-10-CM

## 2018-12-05 DIAGNOSIS — Z1389 Encounter for screening for other disorder: Secondary | ICD-10-CM

## 2018-12-05 DIAGNOSIS — Z3202 Encounter for pregnancy test, result negative: Secondary | ICD-10-CM

## 2018-12-05 DIAGNOSIS — Z87898 Personal history of other specified conditions: Secondary | ICD-10-CM

## 2018-12-05 DIAGNOSIS — O24429 Gestational diabetes mellitus in childbirth, unspecified control: Secondary | ICD-10-CM | POA: Diagnosis not present

## 2018-12-05 DIAGNOSIS — F1291 Cannabis use, unspecified, in remission: Secondary | ICD-10-CM

## 2018-12-05 DIAGNOSIS — Z30017 Encounter for initial prescription of implantable subdermal contraceptive: Secondary | ICD-10-CM | POA: Diagnosis not present

## 2018-12-05 LAB — POCT PREGNANCY, URINE: Preg Test, Ur: NEGATIVE

## 2018-12-05 MED ORDER — AMLODIPINE BESYLATE 5 MG PO TABS: 5.0000 mg | ORAL_TABLET | Freq: Every day | ORAL | 0 refills | Status: DC

## 2018-12-05 NOTE — Progress Notes (Signed)
Subjective:     Leah Walls is a 32 y.o. female who presents for a postpartum visit. She is 5 weeks postpartum following a spontaneous vaginal delivery. I have fully reviewed the prenatal and intrapartum course. The delivery was at 39w 2d gestational weeks. Outcome: vaginal birth after cesarean (VBAC). Anesthesia: epidural. Postpartum course has been uncomplicated. Baby's course has been uncomplicated. Baby is feeding by breast. Bleeding no bleeding. Bowel function is normal. Bladder function is normal. Patient is not sexually active. Contraception method is Nexplanon. Postpartum depression screening: negative.  The following portions of the patient's history were reviewed and updated as appropriate: allergies, current medications, past family history, past medical history, past social history, past surgical history and problem list.  Review of Systems Pertinent items are noted in HPI.   Objective:    BP (!) 140/109   Pulse 73   Wt 141 lb 8 oz (64.2 kg)   BMI 28.58 kg/m   General:  alert, cooperative and appears stated age   Breasts:  inspection negative, no nipple discharge or bleeding, no masses or nodularity palpable  Heart:  regular rate and rhythm, S1, S2 normal, no murmur, click, rub or gallop  Abdomen: soft, non-tender; bowel sounds normal; no masses,  no organomegaly   Vulva:  not evaluated        Assessment:   Normal postpartum exam. Pap smear not done at today's visit. Last pap was in 2017 and it was normal Elevated BP today: asymptomatic; no HA, no scotoma GDM: GTT done today   Surveillance of birth control   Plan:   1. Contraception: Nexplanon 2. Rx: Norvasc 10 mg daily. Discussed the importance of PCP follow up.  3. Follow up in: or as needed.   Breven Guidroz, Harolyn RutherfordJennifer I, NP       -    GYNECOLOGY OFFICE PROCEDURE NOTE  Leah Walls is a 32 y.o. [id number] here for Nexplanon insertion. Last pap smear was in 2017 and it was normal.  No other gynecologic concerns.     Nexplanon Insertion Procedure Patient identified, informed consent performed, consent signed.   Patient does understand that irregular bleeding is a very common side effect of this medication. She was advised to have backup contraception for one week after placement. Pregnancy test in clinic today was negative.  Appropriate time out taken.  Patient's left arm was prepped and draped in the usual sterile fashion. The ruler used to measure and mark insertion area.  Patient was prepped with alcohol swab and then injected with 3 ml of 1% lidocaine.  She was prepped with betadine, Nexplanon removed from packaging,  Device confirmed in needle, then inserted full length of needle and withdrawn per handbook instructions. Nexplanon was able to palpated in the patient's arm; patient palpated the insert herself. There was minimal blood loss.  Patient insertion site covered with guaze and a pressure bandage to reduce any bruising.  The patient tolerated the procedure well and was given post procedure instructions.      Tamar Miano, Harolyn RutherfordJennifer I, NP Faculty Practice Center for [facility], [facility]

## 2018-12-05 NOTE — BH Specialist Note (Signed)
Integrated Behavioral Health Initial Visit  MRN: [id number] Name: Leah Walls  Number of Integrated Behavioral Health Clinician visits:: 1/6 2 Total Session Start time: 9:00  Session End time: 9:13 Total time: 15 minutes  Type of Service: Integrated Behavioral Health- Individual/Family Interpretor:No. Interpretor Name and Language: n/a   Warm Hand Off Completed.       SUBJECTIVE: Leah Walls is a 32 y.o. female accompanied by n/a Patient was referred by Rhett BannisterLaurel Wallace, DO [facility] use during pregnancy. Patient reports the following symptoms/concerns: Pt states she is no longer using marijuana; used to manage ongoing nausea and sickness in pregnancy only. Pt is currently breastfeeding, and is open to receiving educational material today regarding breastfeeding and marijuana use; denies current use.  Duration of problem: Self-reported discontinued use postpartum; Severity of problem: mild  OBJECTIVE: Mood: Normal and Affect: Appropriate Risk of harm to self or others: No plan to harm self or others  LIFE CONTEXT: Family and Social: Pt lives with her seven children (15,13,11,9,3,1; newborn) School/Work: - Self-Care: - Life Changes: Recent childbirth  GOALS ADDRESSED: Patient will: 1. Maintain reduction of symptoms of: anxiety and depression 2. Increase knowledge and/or ability of: healthy habits  3. Demonstrate ability to: Increase motivation to adhere to plan of care  INTERVENTIONS: Interventions utilized: Psychoeducation and/or Health Education  Standardized Assessments completed: Edinburgh Postnatal Depression  ASSESSMENT: Patient currently experiencing History of marijuana use in pregnancy.    Patient may benefit from psychoeducation regarding marijuana use while breastfeeding, and tips for maintaining reduction of symptoms of depression and anxiety.   PLAN: 1. Follow up with behavioral health clinician on : As needed 2. Behavioral recommendations:  -Read  educational materials regarding maintaining reduction of  symptoms of depression and anxiety, along with marijuana use while breastfeeding.  -Continue with no substance use while breastfeeding -Consider continuing prenatal vitamins until they run out  3. Referral(s): Integrated Behavioral Health Services (In Clinic) 4. "From scale of 1-10, how likely are you to follow plan?": -  Rae LipsJamie C Tayva Easterday, LCSW   Depression screen [facility] 2/9 10/25/2018 10/18/2018 10/11/2018 10/04/2018 09/20/2018  Decreased Interest 3 3 2 3 3   Down, Depressed, Hopeless 0 0 1 1 0  PHQ - 2 Score 3 3 3 4 3   Altered sleeping 3 3 3 3 3   Tired, decreased energy 3 3 3 3 3   Change in appetite 2 3 3 3 2   Feeling bad or failure about yourself  0 0 1 0 0  Trouble concentrating 1 1 2 1  0  Moving slowly or fidgety/restless 0 0 0 0 0  Suicidal thoughts 0 0 0 0 0  PHQ-9 Score 12 13 15 14 11   Some recent data might be hidden   GAD 7 : Generalized Anxiety Score 10/25/2018 10/18/2018 10/11/2018 10/04/2018  Nervous, Anxious, on Edge 1 2 1 2   Control/stop worrying 1 1 1 2   Worry too much - different things 1 1 1 2   Trouble relaxing 2 2 2 2   Restless 1 2 2 2   Easily annoyed or irritable 3 3 3 2   Afraid - awful might happen 0 1 1 2   Total GAD 7 Score 9 12 11  14

## 2018-12-06 LAB — GLUCOSE TOLERANCE, 2 HOURS
Glucose, 2 hour: 82 mg/dL (ref 65–139)
Glucose, GTT - Fasting: 91 mg/dL (ref 65–99)

## 2018-12-07 ENCOUNTER — Encounter: Payer: Self-pay | Admitting: Obstetrics and Gynecology

## 2019-01-06 ENCOUNTER — Telehealth: Payer: Self-pay | Admitting: *Deleted

## 2019-01-06 DIAGNOSIS — R7302 Impaired glucose tolerance (oral): Secondary | ICD-10-CM

## 2019-01-06 DIAGNOSIS — Z8632 Personal history of gestational diabetes: Secondary | ICD-10-CM

## 2019-01-06 NOTE — Telephone Encounter (Signed)
Received a message from [facility] called and left message that she never heard back about test results.  I called Leah Walls and she wanted to know her 2 hr gtt results. I  informed her that her fasting level was normal but her 2 hour was elevated . I informed her that she will need to be followed by a PCP and that we will send a referral to [facility] and she should call us if she does not hear from them.  I discussed with Dr. Shawnie Pons and informed Soon she does not have Diabetes but her level puts her int he impaired level because it was elevated and she should be counselled re: diet and we will call her with appointment for that.  She voices understanding.

## 2019-01-06 NOTE — Addendum Note (Signed)
Addended by: Gerome Apley on: 01/06/2019 11:37 AM   Modules accepted: Orders

## 2019-01-07 ENCOUNTER — Other Ambulatory Visit: Payer: Self-pay | Admitting: *Deleted

## 2019-01-07 NOTE — Progress Notes (Signed)
Opened in error

## 2019-01-15 ENCOUNTER — Telehealth: Payer: Self-pay | Admitting: *Deleted

## 2019-01-15 DIAGNOSIS — R7302 Impaired glucose tolerance (oral): Secondary | ICD-10-CM

## 2019-01-15 NOTE — Telephone Encounter (Signed)
Called patient to discuss with her that because of her results of her 2 hour gtt showing she has impaired glucose recommend diet education/ counselling. I have checked with our dietician and she can see her in this office. Left message for patient was calling re: appointment/ results.  Will message front office to schedule.

## 2019-02-01 ENCOUNTER — Other Ambulatory Visit: Payer: Self-pay | Admitting: Obstetrics & Gynecology

## 2019-02-01 DIAGNOSIS — Z3480 Encounter for supervision of other normal pregnancy, unspecified trimester: Secondary | ICD-10-CM

## 2019-02-01 DIAGNOSIS — K219 Gastro-esophageal reflux disease without esophagitis: Secondary | ICD-10-CM

## 2019-02-04 ENCOUNTER — Other Ambulatory Visit: Payer: Self-pay

## 2019-02-19 ENCOUNTER — Ambulatory Visit: Payer: Self-pay | Admitting: *Deleted

## 2019-02-20 ENCOUNTER — Emergency Department (HOSPITAL_COMMUNITY)
Admission: EM | Admit: 2019-02-20 | Discharge: 2019-02-21 | Disposition: A | Discharge status: Home / Self Care | Payer: Medicaid Other | Attending: Emergency Medicine | Admitting: Emergency Medicine

## 2019-02-20 ENCOUNTER — Encounter (HOSPITAL_COMMUNITY): Payer: Self-pay | Admitting: Emergency Medicine

## 2019-02-20 DIAGNOSIS — Z79899 Other long term (current) drug therapy: Secondary | ICD-10-CM | POA: Diagnosis not present

## 2019-02-20 DIAGNOSIS — N3000 Acute cystitis without hematuria: Secondary | ICD-10-CM | POA: Diagnosis not present

## 2019-02-20 DIAGNOSIS — R300 Dysuria: Secondary | ICD-10-CM | POA: Diagnosis present

## 2019-02-20 DIAGNOSIS — Z87891 Personal history of nicotine dependence: Secondary | ICD-10-CM | POA: Insufficient documentation

## 2019-02-20 LAB — URINALYSIS, ROUTINE W REFLEX MICROSCOPIC
Bilirubin Urine: NEGATIVE
Glucose, UA: NEGATIVE mg/dL
Hgb urine dipstick: NEGATIVE
Ketones, ur: NEGATIVE mg/dL
Nitrite: NEGATIVE
Protein, ur: 30 mg/dL — AB
Specific Gravity, Urine: 1.023 (ref 1.005–1.030)
WBC, UA: >50 WBC/hpf — ABNORMAL HIGH (ref 0–5)
pH: 6 (ref 5.0–8.0)

## 2019-02-20 LAB — POC URINE PREG, ED: Preg Test, Ur: NEGATIVE

## 2019-02-20 NOTE — ED Triage Notes (Signed)
Pt reports she believes that she has a UTI, symptoms include painful urination and "strong pee".  Hx of UTI when pregnant.

## 2019-02-21 ENCOUNTER — Other Ambulatory Visit: Payer: Self-pay

## 2019-02-21 MED ORDER — CEPHALEXIN 500 MG PO CAPS: 500.0000 mg | ORAL_CAPSULE | Freq: Two times a day (BID) | ORAL | 0 refills | Status: AC

## 2019-02-21 MED ORDER — PHENAZOPYRIDINE HCL 200 MG PO TABS: 200.0000 mg | ORAL_TABLET | Freq: Three times a day (TID) | ORAL | 0 refills | Status: DC

## 2019-02-21 NOTE — Discharge Instructions (Addendum)
Thank you for allowing me to care for you today in the Emergency Department.   Your urine appears consistent with a UTI or a urinary tract infection today.  Your urine has been sent to the lab for culture.  In the past, your urine has grown a bacteria that can be treated by an antibiotic called Keflex.  Keflex is considered safe to take when you are breast-feeding.  Take 1 tablet by mouth 2 times daily for the next 5 days.  Make sure to take all the tablets and not stop taking the medication until you have completed the entire course.  Stopping an antibiotic early can increase your risk for antibiotic resistance.  If your bacteria grows another antibiotic in the lab that is not treated by Keflex, someone from the hospital would call you at the number you provided registration today.  You can take 1 tablet of Pyridium by mouth every 8 hours for the next 2 days to help with symptoms associated with your infection.  There are no recommendations regarding this medication when you are breast-feeding.  Call the number on your discharge paperwork to get established with a primary care provider for follow-up.  Return to the emergency department if you develop high fevers, worsening abdominal pain, particularly after you have been on antibiotics for 72 hours, if you develop persistent vomiting, or other new, concerning symptoms.

## 2019-02-21 NOTE — ED Provider Notes (Signed)
[facility] Provider Note   CSN: [id number] Arrival date & time: 02/20/19  2155    History   Chief Complaint Chief Complaint  Patient presents with  . Urinary Tract Infection    HPI Leah Walls is a 33 y.o. female with a history of gestational diabetes and recurrent UTIs who presents to the emergency department with a chief complaint of urinary hesitancy and dysuria for the last few days.  She states that she has had similar symptoms before when she has had a urinary tract infection.  She denies fever, chills, abdominal pain, back pain, hematuria, vaginal pain, bleeding, discharge, dyspareunia, diarrhea, constipation, vomiting, nausea, chest pain, or shortness of breath.  No treatment prior to arrival.  She reports that she is currently breast-feeding.     The history is provided by the patient. No language interpreter was used.    Past Medical History:  Diagnosis Date  . Gestational diabetes   . Headache   . Hernia, abdominal 2011  . Supervision of other normal pregnancy, antepartum 05/17/2018    Nursing Staff Provider Office Location  [facility] Dating   Language  English Anatomy US  Normal female Flu Vaccine  Declined  Genetic Screen   NIPS: low risk  TDaP vaccine   08/27/18 Hgb A1C or  GTT Early  Third trimester: GDM  Rhogam  n/a   LAB RESULTS  Feeding Plan Breast Blood Type O/Positive/-- (05/31 1059) O pos Contraception nexplanon Antibody Negative (05/31 1059)neg Circumcision Female  Rub    Patient Active Problem List   Diagnosis Date Noted  . Gestational diabetes 10/29/2018  . VBAC, delivered 10/29/2018  . GDM, class A2 08/29/2018  . History of preterm delivery 08/27/2018  . Supervision of high-risk pregnancy 05/17/2018  . UTI (urinary tract infection) during pregnancy 04/05/2018  . Umbilical hernia 11/15/2016  . Genital herpes affecting pregnancy, antepartum 10/18/2016  . Marijuana use 07/30/2016  . History of VBAC x4 07/24/2016    . Grand multiparity, antepartum 07/24/2016  . Breast lump on right side at 1 o'clock position 07/24/2016    Past Surgical History:  Procedure Laterality Date  . CESAREAN SECTION    . DILATION AND EVACUATION Bilateral 07/14/2015   Procedure: DILATATION AND EVACUATION;  Surgeon: Kathreen CosierBernard A Marshall, MD;  Location: [facility];  Service: Gynecology;  Laterality: Bilateral;  . FOREARM FRACTURE SURGERY Right 2008  . WISDOM TOOTH EXTRACTION       OB History    Gravida  7   Para  7   Term  6   Preterm  1   AB  0   Living  7     SAB  0   TAB  0   Ectopic  0   Multiple  0   Live Births  5            Home Medications    Prior to Admission medications   Medication Sig Start Date End Date Taking? Authorizing Provider  amLODipine (NORVASC) 5 MG tablet Take 1 tablet (5 mg total) by mouth daily. 12/05/18   Rasch, Victorino DikeJennifer I, NP  cephALEXin (KEFLEX) 500 MG capsule Take 1 capsule (500 mg total) by mouth 2 (two) times daily for 5 days. 02/21/19 02/26/19  Evelyna Folker A, PA-C  ibuprofen (ADVIL,MOTRIN) 600 MG tablet Take 1 tablet (600 mg total) by mouth every 6 (six) hours. 10/31/18   Oralia ManisAbraham, Sherin, DO  omeprazole (PRILOSEC) 40 MG capsule TAKE 1 CAPSULE BY MOUTH EVERY DAY  09/19/18   Rasch, Victorino Dike I, NP  phenazopyridine (PYRIDIUM) 200 MG tablet Take 1 tablet (200 mg total) by mouth 3 (three) times daily. 02/21/19   Dandra Velardi A, PA-C  Prenat-FeAsp-Meth-FA-DHA w/o A (PRENATE PIXIE) 10-0.6-0.4-200 MG CAPS TAKE 1 CAPSULE BY MOUTH EVERY DAY 02/01/19   Anyanwu, Jethro Bastos, MD  promethazine (PHENERGAN) 25 MG tablet Take 1 tablet (25 mg total) by mouth every 6 (six) hours as needed for nausea or vomiting. Patient not taking: Reported on 12/05/2018 10/25/18   Anyanwu, Jethro Bastos, MD  valACYclovir (VALTREX) 500 MG tablet Take 1 tablet (500 mg total) by mouth 2 (two) times daily. Patient not taking: Reported on 12/05/2018 08/27/18   Rasch, Harolyn Rutherford, NP    Family History Family History  Problem  Relation Age of Onset  . Hypertension Mother   . Hypertension Maternal Grandmother   . Hypertension Paternal Grandmother     Social History Social History   Tobacco Use  . Smoking status: Former Smoker    Types: Cigarettes  . Smokeless tobacco: Never Used  Substance Use Topics  . Alcohol use: No  . Drug use: No     Allergies   Patient has no known allergies.   Review of Systems Review of Systems  Constitutional: Negative for activity change, chills and fever.  Respiratory: Negative for shortness of breath.   Cardiovascular: Negative for chest pain.  Gastrointestinal: Negative for abdominal pain, anal bleeding, blood in stool, diarrhea, nausea and vomiting.  Genitourinary: Positive for dysuria. Negative for dyspareunia, frequency, hematuria, vaginal bleeding, vaginal discharge and vaginal pain.       Urinary hesitancy  Musculoskeletal: Negative for back pain, myalgias, neck pain and neck stiffness.  Skin: Negative for rash.  Allergic/Immunologic: Negative for immunocompromised state.  Neurological: Negative for headaches.  Psychiatric/Behavioral: Negative for confusion.     Physical Exam Updated Vital Signs BP (!) 144/108 (BP Location: Left Arm)   Pulse 98   Temp 97.7 F (36.5 C) (Oral)   Resp (!) 22   SpO2 100%   Physical Exam Vitals signs and nursing note reviewed.  Constitutional:      General: She is not in acute distress. HENT:     Head: Normocephalic.  Eyes:     Conjunctiva/sclera: Conjunctivae normal.  Neck:     Musculoskeletal: Neck supple.  Cardiovascular:     Rate and Rhythm: Normal rate and regular rhythm.     Heart sounds: No murmur. No friction rub. No gallop.   Pulmonary:     Effort: Pulmonary effort is normal. No respiratory distress.     Breath sounds: No stridor. No wheezing, rhonchi or rales.  Chest:     Chest wall: No tenderness.  Abdominal:     General: There is no distension.     Palpations: Abdomen is soft. There is no mass.      Tenderness: There is no abdominal tenderness. There is no right CVA tenderness, left CVA tenderness, guarding or rebound.     Hernia: No hernia is present.     Comments: Abdomen is soft, nontender, nondistended.  No CVA tenderness bilaterally.  Skin:    General: Skin is warm.     Findings: No rash.  Neurological:     Mental Status: She is alert.  Psychiatric:        Behavior: Behavior normal.      ED Treatments / Results  Labs (all labs ordered are listed, but only abnormal results are displayed) Labs Reviewed  URINALYSIS, ROUTINE W  REFLEX MICROSCOPIC - Abnormal; Notable for the following components:      Result Value   APPearance HAZY (*)    Protein, ur 30 (*)    Leukocytes,Ua SMALL (*)    WBC, UA >50 (*)    Bacteria, UA FEW (*)    All other components within normal limits  URINE CULTURE  POC URINE PREG, ED    EKG None  Radiology No results found.  Procedures Procedures (including critical care time)  Medications Ordered in ED Medications - No data to display   Initial Impression / Assessment and Plan / ED Course  I have reviewed the triage vital signs and the nursing notes.  Pertinent labs & imaging results that were available during my care of the patient were reviewed by me and considered in my medical decision making (see chart for details).        33 year old female with a history of gestational diabetes and recurrent UTIs presenting with urinary symptoms over the last few days.  She is currently breast-feeding.  No vaginal complaints.  She is afebrile and vital signs are reassuring.  No constitutional symptoms.  Previous urine cultures reviewed and show susceptibility to Keflex.  Urine culture has been sent.  Low suspicion for STI, urosepsis, obstructive uropathy.  Will discharge with Pyridium for symptomatic relief.  I have also advised the patient to get established with a primary care provider for follow-up.  All questions answered.  She is  hemodynamically stable and in no acute distress.  Return precautions given.  She is safe for discharge to home with outpatient follow-up.    Final Clinical Impressions(s) / ED Diagnoses   Final diagnoses:  Acute cystitis without hematuria    ED Discharge Orders         Ordered    cephALEXin (KEFLEX) 500 MG capsule  2 times daily     02/21/19 0053    phenazopyridine (PYRIDIUM) 200 MG tablet  3 times daily     02/21/19 0053           Jed Kutch A, PA-C 02/21/19 0100    Geoffery Lyons, MD 02/21/19 [phone]

## 2019-02-23 LAB — URINE CULTURE: Culture: >=100000 — AB

## 2019-02-24 ENCOUNTER — Telehealth: Payer: Self-pay | Admitting: Emergency Medicine

## 2019-02-24 NOTE — Telephone Encounter (Signed)
Post ED Visit - Positive Culture Follow-up  Culture report reviewed by antimicrobial stewardship pharmacist: [facility] []  Enzo Bi, Pharm.D. []  Celedonio Miyamoto, Pharm.D., BCPS AQ-ID []  Garvin Fila, Pharm.D., BCPS []  Georgina Pillion, Pharm.D., BCPS []  [place], [address].D., BCPS, AAHIVP []  Estella Husk, Pharm.D., BCPS, AAHIVP []  Lysle Pearl, PharmD, BCPS []  Phillips Climes, PharmD, BCPS []  Agapito Games, PharmD, BCPS []  Verlan Friends, PharmD []  Mervyn Gay, PharmD, BCPS []  Vinnie Level, PharmD Wendelyn Breslow PharmD  [facility] []  Len Childs, PharmD []  Greer Pickerel, PharmD []  Adalberto Cole, PharmD []  Perlie Gold, Rph []  Lonell Face) Jean Rosenthal, PharmD []  Earl Many, PharmD []  Junita Push, PharmD []  Dorna Leitz, PharmD []  Terrilee Files, PharmD []  Lynann Beaver, PharmD []  Keturah Barre, PharmD []  Loralee Pacas, PharmD []  Bernadene Person, PharmD   Positive urine culture Treated with cephalexin, organism sensitive to the same and no further patient follow-up is required at this time.  Berle Mull 02/24/2019, 1:37 PM

## 2019-03-16 ENCOUNTER — Encounter (HOSPITAL_COMMUNITY): Payer: Self-pay | Admitting: Emergency Medicine

## 2019-03-16 ENCOUNTER — Emergency Department (HOSPITAL_COMMUNITY)
Admission: EM | Admit: 2019-03-16 | Discharge: 2019-03-17 | Disposition: A | Discharge status: Home / Self Care | Payer: Medicaid Other | Attending: Emergency Medicine | Admitting: Emergency Medicine

## 2019-03-16 DIAGNOSIS — Z79899 Other long term (current) drug therapy: Secondary | ICD-10-CM | POA: Diagnosis not present

## 2019-03-16 DIAGNOSIS — Z87891 Personal history of nicotine dependence: Secondary | ICD-10-CM | POA: Diagnosis not present

## 2019-03-16 DIAGNOSIS — K047 Periapical abscess without sinus: Secondary | ICD-10-CM | POA: Diagnosis not present

## 2019-03-16 DIAGNOSIS — K0889 Other specified disorders of teeth and supporting structures: Secondary | ICD-10-CM | POA: Diagnosis present

## 2019-03-16 MED ORDER — BUPIVACAINE-EPINEPHRINE (PF) 0.5% -1:200000 IJ SOLN
1.8000 mL | Freq: Once | INTRAMUSCULAR | Status: AC
  Administered 2019-03-16: 1.8 mL
  Filled 2019-03-16: qty 1.8

## 2019-03-16 NOTE — ED Provider Notes (Signed)
[facility] EMERGENCY DEPARTMENT Provider Note   CSN: [id number] Arrival date & time: 03/16/19  2346    History   Chief Complaint Chief Complaint  Patient presents with  . Dental Pain    HPI Leah Walls is a 33 y.o. female with a hx of gestational diabetes, UTI presents to the Emergency Department complaining of gradual, persistent, progressively worsening left lower dental pain onset this morning.  Pt reports associated gingival swelling and tenderness at the site.  Pt reports last dentist visit was more than 1 year ago.  Pt reports taking tylenol without relief of pain. She denies fever, chills, speech/voice changes, sore throat, difficulty swallowing, abd pain, vomiting, facial swelling, CP, SOB.  Pt reports she has not had trouble with her blood sugar since the birth of her child.       The history is provided by the patient and medical records. No language interpreter was used.    Past Medical History:  Diagnosis Date  . Gestational diabetes   . Headache   . Hernia, abdominal 2011  . Supervision of other normal pregnancy, antepartum 05/17/2018    Nursing Staff Provider Office Location  [facility] Dating   Language  English Anatomy US  Normal female Flu Vaccine  Declined  Genetic Screen   NIPS: low risk  TDaP vaccine   08/27/18 Hgb A1C or  GTT Early  Third trimester: GDM  Rhogam  n/a   LAB RESULTS  Feeding Plan Breast Blood Type O/Positive/-- (05/31 1059) O pos Contraception nexplanon Antibody Negative (05/31 1059)neg Circumcision Female  Rub    Patient Active Problem List   Diagnosis Date Noted  . Gestational diabetes 10/29/2018  . VBAC, delivered 10/29/2018  . GDM, class A2 08/29/2018  . History of preterm delivery 08/27/2018  . Supervision of high-risk pregnancy 05/17/2018  . UTI (urinary tract infection) during pregnancy 04/05/2018  . Umbilical hernia 11/15/2016  . Genital herpes affecting pregnancy, antepartum 10/18/2016  . Marijuana use 07/30/2016   . History of VBAC x4 07/24/2016  . Grand multiparity, antepartum 07/24/2016  . Breast lump on right side at 1 o'clock position 07/24/2016    Past Surgical History:  Procedure Laterality Date  . CESAREAN SECTION    . DILATION AND EVACUATION Bilateral 07/14/2015   Procedure: DILATATION AND EVACUATION;  Surgeon: Kathreen Cosier, MD;  Location: [facility];  Service: Gynecology;  Laterality: Bilateral;  . FOREARM FRACTURE SURGERY Right 2008  . WISDOM TOOTH EXTRACTION       OB History    Gravida  7   Para  7   Term  6   Preterm  1   AB  0   Living  7     SAB  0   TAB  0   Ectopic  0   Multiple  0   Live Births  5            Home Medications    Prior to Admission medications   Medication Sig Start Date End Date Taking? Authorizing Provider  amLODipine (NORVASC) 5 MG tablet Take 1 tablet (5 mg total) by mouth daily. 12/05/18   Rasch, Victorino Dike I, NP  amoxicillin-clavulanate (AUGMENTIN) 875-125 MG tablet Take 1 tablet by mouth 2 (two) times daily. One po bid x 7 days 03/17/19   Zak Gondek, Dahlia Client, PA-C  ibuprofen (ADVIL,MOTRIN) 600 MG tablet Take 1 tablet (600 mg total) by mouth every 6 (six) hours. 10/31/18   Oralia Manis, DO  omeprazole (PRILOSEC) 40 MG  capsule TAKE 1 CAPSULE BY MOUTH EVERY DAY 09/19/18   Rasch, Victorino Dike I, NP  phenazopyridine (PYRIDIUM) 200 MG tablet Take 1 tablet (200 mg total) by mouth 3 (three) times daily. 02/21/19   McDonald, Mia A, PA-C  Prenat-FeAsp-Meth-FA-DHA w/o A (PRENATE PIXIE) 10-0.6-0.4-200 MG CAPS TAKE 1 CAPSULE BY MOUTH EVERY DAY 02/01/19   Anyanwu, Jethro Bastos, MD  promethazine (PHENERGAN) 25 MG tablet Take 1 tablet (25 mg total) by mouth every 6 (six) hours as needed for nausea or vomiting. Patient not taking: Reported on 12/05/2018 10/25/18   Anyanwu, Jethro Bastos, MD  valACYclovir (VALTREX) 500 MG tablet Take 1 tablet (500 mg total) by mouth 2 (two) times daily. Patient not taking: Reported on 12/05/2018 08/27/18   Rasch, Harolyn Rutherford, NP     Family History Family History  Problem Relation Age of Onset  . Hypertension Mother   . Hypertension Maternal Grandmother   . Hypertension Paternal Grandmother     Social History Social History   Tobacco Use  . Smoking status: Former Smoker    Types: Cigarettes  . Smokeless tobacco: Never Used  Substance Use Topics  . Alcohol use: No  . Drug use: No     Allergies   Patient has no known allergies.   Review of Systems Review of Systems  Constitutional: Negative for chills and fever.  HENT: Positive for facial swelling.   Respiratory: Negative for chest tightness.   Cardiovascular: Negative for chest pain.  Gastrointestinal: Negative for abdominal pain and vomiting.  Endocrine: Negative for polydipsia, polyphagia and polyuria.  Skin: Negative for color change.  Allergic/Immunologic: Negative for immunocompromised state.  Neurological: Negative for numbness.  Hematological: Negative for adenopathy.     Physical Exam Updated Vital Signs BP (!) 120/107 (BP Location: Right Arm)   Pulse 90   Temp 98.7 F (37.1 C) (Oral)   Resp 18   Ht 4\' 11"  (1.499 m)   Wt 61.2 kg   SpO2 99%   BMI 27.27 kg/m   Physical Exam Vitals signs and nursing note reviewed.  Constitutional:      Appearance: She is well-developed.  HENT:     Head: Normocephalic.     Right Ear: External ear normal.     Left Ear: External ear normal.     Nose: Nose normal.     Right Sinus: No maxillary sinus tenderness.     Left Sinus: No maxillary sinus tenderness.     Mouth/Throat:     Mouth: No lacerations or oral lesions.     Dentition: Gingival swelling and dental abscesses present.     Pharynx: Uvula midline. No oropharyngeal exudate, posterior oropharyngeal erythema or uvula swelling.     Tonsils: No tonsillar abscesses.      Comments: No obvious dental caries or broken teeth Eyes:     General:        Right eye: No discharge.        Left eye: No discharge.     Conjunctiva/sclera:  Conjunctivae normal.  Neck:     Musculoskeletal: Normal range of motion and neck supple.     Comments: No stridor Handling secretions without difficulty No nuchal rigidity No cervical lymphadenopathy  Cardiovascular:     Rate and Rhythm: Normal rate and regular rhythm.  Pulmonary:     Effort: Pulmonary effort is normal.  Abdominal:     General: There is no distension.     Palpations: Abdomen is soft.  Musculoskeletal: Normal range of motion.  Lymphadenopathy:  Cervical: No cervical adenopathy.  Skin:    General: Skin is warm and dry.  Neurological:     Mental Status: She is alert.  Psychiatric:        Mood and Affect: Mood normal.      ED Treatments / Results   Procedures .Marland KitchenIncision and Drainage Date/Time: 03/17/2019 12:04 AM Performed by: Dierdre Forth, PA-C Authorized by: Dierdre Forth, PA-C   Consent:    Consent obtained:  Verbal   Consent given by:  Patient   Risks discussed:  Bleeding, incomplete drainage and pain   Alternatives discussed:  Alternative treatment (abx alone) Universal protocol:    Procedure explained and questions answered to patient or proxy's satisfaction: yes     Relevant documents present and verified: yes     Test results available and properly labeled: yes     Imaging studies available: yes     Required blood products, implants, devices, and special equipment available: yes     Site/side marked: yes     Immediately prior to procedure a time out was called: yes     Patient identity confirmed:  Verbally with patient Location:    Type:  Abscess   Location:  Mouth   Mouth location:  Alveolar process Anesthesia (see MAR for exact dosages):    Anesthesia method:  Local infiltration   Local anesthetic:  Bupivacaine 0.5% WITH epi Procedure type:    Complexity:  Simple Procedure details:    Needle aspiration: yes     Needle size:  18 G   Wound management:  Irrigated with saline   Drainage:  Purulent   Drainage amount:   Moderate Post-procedure details:    Patient tolerance of procedure:  Tolerated well, no immediate complications   (including critical care time)  Medications Ordered in ED Medications  ibuprofen (ADVIL,MOTRIN) tablet 800 mg (has no administration in time range)  bupivacaine-epinephrine (MARCAINE W/ EPI) 0.5% -1:200000 injection 1.8 mL (1.8 mLs Infiltration Given 03/16/19 2358)     Initial Impression / Assessment and Plan / ED Course  I have reviewed the triage vital signs and the nursing notes.  Pertinent labs & imaging results that were available during my care of the patient were reviewed by me and considered in my medical decision making (see chart for details).        Patient with toothache.  Localized abscess on exam.  Needle aspiration of abscess with 1.65mL of purulent drainage.  Pt with improved pain after procedure and no immediate complications.  Exam unconcerning for Ludwig's angina or spread of infection.  Will treat with Augmentin and anti-inflammatory medicine.  Urged patient to follow-up with dentist.     Final Clinical Impressions(s) / ED Diagnoses   Final diagnoses:  Dental abscess    ED Discharge Orders         Ordered    amoxicillin-clavulanate (AUGMENTIN) 875-125 MG tablet  2 times daily     03/17/19 0016           Anndrea Mihelich, Boyd Kerbs 03/17/19 0017    Dione Booze, MD 03/17/19 [phone]

## 2019-03-16 NOTE — ED Triage Notes (Signed)
Woke up with swelling to left lower gum today.

## 2019-03-17 MED ORDER — AMOXICILLIN-POT CLAVULANATE 875-125 MG PO TABS: 1.0000 | ORAL_TABLET | Freq: Two times a day (BID) | ORAL | 0 refills | Status: DC

## 2019-03-17 MED ORDER — IBUPROFEN 800 MG PO TABS
800.0000 mg | ORAL_TABLET | Freq: Once | ORAL | Status: AC
  Administered 2019-03-17: 800 mg via ORAL
  Filled 2019-03-17: qty 1

## 2019-03-17 NOTE — ED Notes (Signed)
Discharge instructions, prescription, and follow-up care discussed with pt. Pt verbalized understanding and has no questions at this time.

## 2019-03-17 NOTE — Discharge Instructions (Addendum)
1. Medications: Augmentin, usual home medications 2. Treatment: rest, drink plenty of fluids, take medications as prescribed 3. Follow Up: Please followup with dentistry within 1 week for discussion of your diagnoses and further evaluation after today's visit; if you do not have a primary care doctor use the resource guide provided to find one; Return to the ER for high fevers, difficulty breathing, difficulty swallowing or other concerning symptoms

## 2019-04-08 ENCOUNTER — Other Ambulatory Visit: Payer: Self-pay

## 2019-04-08 DIAGNOSIS — Z3480 Encounter for supervision of other normal pregnancy, unspecified trimester: Secondary | ICD-10-CM

## 2019-04-08 DIAGNOSIS — K219 Gastro-esophageal reflux disease without esophagitis: Secondary | ICD-10-CM

## 2019-04-08 MED ORDER — OMEPRAZOLE 40 MG PO CPDR: DELAYED_RELEASE_CAPSULE | ORAL | 2 refills | Status: DC

## 2019-04-08 NOTE — Telephone Encounter (Signed)
Received fax from [facility] for refill on Omeprazole DR 40 MG Capsule.

## 2019-04-15 ENCOUNTER — Other Ambulatory Visit: Payer: Self-pay

## 2019-04-15 DIAGNOSIS — Z3480 Encounter for supervision of other normal pregnancy, unspecified trimester: Secondary | ICD-10-CM

## 2019-04-15 DIAGNOSIS — K219 Gastro-esophageal reflux disease without esophagitis: Secondary | ICD-10-CM

## 2019-04-15 MED ORDER — PRENATE PIXIE 10-0.6-0.4-200 MG PO CAPS: 1.0000 | ORAL_CAPSULE | Freq: Every day | ORAL | 3 refills | Status: DC

## 2019-04-15 MED ORDER — OMEPRAZOLE 40 MG PO CPDR: DELAYED_RELEASE_CAPSULE | ORAL | 2 refills | Status: DC

## 2019-04-15 NOTE — Progress Notes (Signed)
Sent in refills for patient, advised that since she is delivered to find a PCP. /Pt verbalized understanding.

## 2019-05-15 ENCOUNTER — Other Ambulatory Visit: Payer: Self-pay | Admitting: *Deleted

## 2019-05-15 DIAGNOSIS — K219 Gastro-esophageal reflux disease without esophagitis: Secondary | ICD-10-CM

## 2019-05-15 MED ORDER — OMEPRAZOLE 40 MG PO CPDR: DELAYED_RELEASE_CAPSULE | ORAL | 2 refills | Status: DC

## 2019-05-15 NOTE — Progress Notes (Signed)
Received fax request from [facility] for refill of Omeprazole. Per chart review of notes, Rx refill was authorized on 04/15/19 however did not get e-prescribed (was printed - pt not present). Therefore, Rx was e-prescribed today. Pt was advised on 4/28 that she would need to follow up w/PCP. No further refills of this medication will be provided after today.

## 2019-07-05 ENCOUNTER — Other Ambulatory Visit: Payer: Self-pay

## 2019-07-05 ENCOUNTER — Emergency Department (HOSPITAL_COMMUNITY)
Admission: EM | Admit: 2019-07-05 | Discharge: 2019-07-05 | Disposition: A | Discharge status: Home / Self Care | Payer: Medicaid Other | Attending: Emergency Medicine | Admitting: Emergency Medicine

## 2019-07-05 ENCOUNTER — Encounter (HOSPITAL_COMMUNITY): Payer: Self-pay | Admitting: Emergency Medicine

## 2019-07-05 DIAGNOSIS — Z87891 Personal history of nicotine dependence: Secondary | ICD-10-CM | POA: Insufficient documentation

## 2019-07-05 DIAGNOSIS — N390 Urinary tract infection, site not specified: Secondary | ICD-10-CM | POA: Insufficient documentation

## 2019-07-05 DIAGNOSIS — B9689 Other specified bacterial agents as the cause of diseases classified elsewhere: Secondary | ICD-10-CM | POA: Diagnosis not present

## 2019-07-05 DIAGNOSIS — N760 Acute vaginitis: Secondary | ICD-10-CM | POA: Insufficient documentation

## 2019-07-05 DIAGNOSIS — Z79899 Other long term (current) drug therapy: Secondary | ICD-10-CM | POA: Insufficient documentation

## 2019-07-05 DIAGNOSIS — R300 Dysuria: Secondary | ICD-10-CM | POA: Diagnosis present

## 2019-07-05 DIAGNOSIS — Z3202 Encounter for pregnancy test, result negative: Secondary | ICD-10-CM | POA: Insufficient documentation

## 2019-07-05 LAB — WET PREP, GENITAL
Sperm: NONE SEEN
Trich, Wet Prep: NONE SEEN
Yeast Wet Prep HPF POC: NONE SEEN

## 2019-07-05 LAB — URINALYSIS, ROUTINE W REFLEX MICROSCOPIC
Bilirubin Urine: NEGATIVE
Glucose, UA: NEGATIVE mg/dL
Ketones, ur: NEGATIVE mg/dL
Nitrite: POSITIVE — AB
Protein, ur: 100 mg/dL — AB
Specific Gravity, Urine: 1.025 (ref 1.005–1.030)
pH: 6.5 (ref 5.0–8.0)

## 2019-07-05 LAB — URINALYSIS, MICROSCOPIC (REFLEX)
RBC / HPF: >50 RBC/hpf (ref 0–5)
WBC, UA: >50 WBC/hpf (ref 0–5)

## 2019-07-05 LAB — PREGNANCY, URINE: Preg Test, Ur: NEGATIVE

## 2019-07-05 MED ORDER — METRONIDAZOLE 500 MG PO TABS: 500.0000 mg | ORAL_TABLET | Freq: Two times a day (BID) | ORAL | 0 refills | Status: DC

## 2019-07-05 MED ORDER — CEPHALEXIN 500 MG PO CAPS: 500.0000 mg | ORAL_CAPSULE | Freq: Four times a day (QID) | ORAL | 0 refills | Status: DC

## 2019-07-05 MED ORDER — METRONIDAZOLE 0.75 % VA GEL: 1.0000 | Freq: Every day | VAGINAL | 0 refills | Status: AC

## 2019-07-05 NOTE — Discharge Instructions (Signed)
You were seen in the ED today for urinary symptoms You were found to have both a UTI and Bacterial Vaginosis I have sent both an antibiotic for the UTI as well as the BV - please check with pharmacy in regards to the Flagyl cream vs the oral pill.  If you take the oral pill please refrain from breast feeding for the next week You will receive a call if you end up testing positive for Gonorrhea or Chlamydia Please refrain from sexual intercourse for the next 10 days until you finish your treatment course Please also refrain from drinking alcohol with the Flagyl oral medication Follow up with your PCP

## 2019-07-05 NOTE — ED Notes (Signed)
Patient verbalizes understanding of discharge instructions. Opportunity for questioning and answers were provided. Armband removed by staff, pt discharged from ED.  

## 2019-07-05 NOTE — ED Triage Notes (Signed)
Pt reports painful urination and urinary frequency x3 days, denies any vaginal bleeding, reports some white vaginal discharge.

## 2019-07-05 NOTE — ED Provider Notes (Signed)
[facility] EMERGENCY DEPARTMENT Provider Note   CSN: [id number] Arrival date & time: 07/05/19  0845    History   Chief Complaint Chief Complaint  Patient presents with  . Urinary Tract Infection    HPI Leah Walls is a 33 y.o. female who presents to the ED today complaining of dysuria and urinary frequency x 3 days. Pt also reports white, thick, vaginal discharge for the same amount of time. She reports she was recently sexually active with a female partner and did not use protection; pt does have a birthcontrol implant in her arm. Denies fever, chills, abdominal pain, nausea, vomiting, diarrhea, constipation, pelvic pain, flank pain.        Past Medical History:  Diagnosis Date  . Gestational diabetes   . Headache   . Hernia, abdominal 2011  . Supervision of other normal pregnancy, antepartum 05/17/2018    Nursing Staff Provider Office Location  [facility] Dating   Language  English Anatomy US  Normal female Flu Vaccine  Declined  Genetic Screen   NIPS: low risk  TDaP vaccine   08/27/18 Hgb A1C or  GTT Early  Third trimester: GDM  Rhogam  n/a   LAB RESULTS  Feeding Plan Breast Blood Type O/Positive/-- (05/31 1059) O pos Contraception nexplanon Antibody Negative (05/31 1059)neg Circumcision Female  Rub    Patient Active Problem List   Diagnosis Date Noted  . Gestational diabetes 10/29/2018  . VBAC, delivered 10/29/2018  . GDM, class A2 08/29/2018  . History of preterm delivery 08/27/2018  . Supervision of high-risk pregnancy 05/17/2018  . UTI (urinary tract infection) during pregnancy 04/05/2018  . Umbilical hernia 83/15/1761  . Genital herpes affecting pregnancy, antepartum 10/18/2016  . Marijuana use 07/30/2016  . History of VBAC x4 07/24/2016  . [place] multiparity, antepartum 07/24/2016  . Breast lump on right side at 1 o'clock position 07/24/2016    Past Surgical History:  Procedure Laterality Date  . CESAREAN SECTION    . DILATION AND EVACUATION  Bilateral 07/14/2015   Procedure: DILATATION AND EVACUATION;  Surgeon: Frederico Hamman, MD;  Location: [facility];  Service: Gynecology;  Laterality: Bilateral;  . FOREARM FRACTURE SURGERY Right 2008  . WISDOM TOOTH EXTRACTION       OB History    Gravida  7   Para  7   Term  6   Preterm  1   AB  0   Living  7     SAB  0   TAB  0   Ectopic  0   Multiple  0   Live Births  5            Home Medications    Prior to Admission medications   Medication Sig Start Date End Date Taking? Authorizing Provider  amLODipine (NORVASC) 5 MG tablet Take 1 tablet (5 mg total) by mouth daily. 12/05/18   Rasch, Anderson Malta I, NP  amoxicillin-clavulanate (AUGMENTIN) 875-125 MG tablet Take 1 tablet by mouth 2 (two) times daily. One po bid x 7 days 03/17/19   Muthersbaugh, Jarrett Soho, PA-C  cephALEXin (KEFLEX) 500 MG capsule Take 1 capsule (500 mg total) by mouth 4 (four) times daily. 07/05/19   Alroy Bailiff, Jaylun Fleener, PA-C  ibuprofen (ADVIL,MOTRIN) 600 MG tablet Take 1 tablet (600 mg total) by mouth every 6 (six) hours. 10/31/18   Caroline More, DO  metroNIDAZOLE (FLAGYL) 500 MG tablet Take 1 tablet (500 mg total) by mouth 2 (two) times daily. 07/05/19   Eustaquio Maize,  PA-C  metroNIDAZOLE (METROGEL) 0.75 % vaginal gel Place 1 Applicatorful vaginally daily for 5 days. 07/05/19 07/10/19  Tanda RockersVenter, Brigette Hopfer, PA-C  omeprazole (PRILOSEC) 40 MG capsule TAKE 1 CAPSULE BY MOUTH EVERY DAY 05/15/19   Rasch, Victorino DikeJennifer I, NP  phenazopyridine (PYRIDIUM) 200 MG tablet Take 1 tablet (200 mg total) by mouth 3 (three) times daily. 02/21/19   McDonald, Mia A, PA-C  Prenat-FeAsp-Meth-FA-DHA w/o A (PRENATE PIXIE) 10-0.6-0.4-200 MG CAPS Take 1 tablet by mouth daily. 04/15/19   Rasch, Victorino DikeJennifer I, NP  promethazine (PHENERGAN) 25 MG tablet Take 1 tablet (25 mg total) by mouth every 6 (six) hours as needed for nausea or vomiting. Patient not taking: Reported on 12/05/2018 10/25/18   Anyanwu, Jethro BastosUgonna A, MD  valACYclovir (VALTREX) 500 MG  tablet Take 1 tablet (500 mg total) by mouth 2 (two) times daily. Patient not taking: Reported on 12/05/2018 08/27/18   Rasch, Harolyn RutherfordJennifer I, NP    Family History Family History  Problem Relation Age of Onset  . Hypertension Mother   . Hypertension Maternal Grandmother   . Hypertension Paternal Grandmother     Social History Social History   Tobacco Use  . Smoking status: Former Smoker    Types: Cigarettes  . Smokeless tobacco: Never Used  Substance Use Topics  . Alcohol use: No  . Drug use: No     Allergies   Patient has no known allergies.   Review of Systems Review of Systems  Constitutional: Negative for chills and fever.  Gastrointestinal: Negative for abdominal pain, nausea and vomiting.  Genitourinary: Positive for dysuria, flank pain, frequency and vaginal discharge. Negative for hematuria, pelvic pain and vaginal bleeding.     Physical Exam Updated Vital Signs BP 120/84 (BP Location: Right Arm)   Pulse 87   LMP  (LMP Unknown)   SpO2 100%   Physical Exam Vitals signs and nursing note reviewed.  Constitutional:      Appearance: She is not ill-appearing.  HENT:     Head: Normocephalic and atraumatic.  Eyes:     Conjunctiva/sclera: Conjunctivae normal.  Cardiovascular:     Rate and Rhythm: Normal rate and regular rhythm.  Pulmonary:     Effort: Pulmonary effort is normal.     Breath sounds: Normal breath sounds.  Abdominal:     General: Abdomen is flat.     Tenderness: There is no abdominal tenderness. There is no guarding or rebound.  Genitourinary:    Comments: Chaperone present for exam. No rashes, lesions, or tenderness to external genitalia. No erythema, injury, or tenderness to vaginal mucosa. Thick white vaginal discharge to vaginal vault. No adnexal masses, tenderness, or fullness. No CMT, cervical friability, or discharge from cervical os. Cervical os is closed. Uterus non-deviated, mobile, nonTTP, and without enlargement.   Skin:    General:  Skin is warm and dry.     Coloration: Skin is not jaundiced.  Neurological:     Mental Status: She is alert.      ED Treatments / Results  Labs (all labs ordered are listed, but only abnormal results are displayed) Labs Reviewed  WET PREP, GENITAL - Abnormal; Notable for the following components:      Result Value   Clue Cells Wet Prep HPF POC PRESENT (*)    WBC, Wet Prep HPF POC MANY (*)    All other components within normal limits  URINALYSIS, ROUTINE W REFLEX MICROSCOPIC - Abnormal; Notable for the following components:   APPearance TURBID (*)    Hgb  urine dipstick LARGE (*)    Protein, ur 100 (*)    Nitrite POSITIVE (*)    Leukocytes,Ua LARGE (*)    All other components within normal limits  URINALYSIS, MICROSCOPIC (REFLEX) - Abnormal; Notable for the following components:   Bacteria, UA MANY (*)    All other components within normal limits  URINE CULTURE  PREGNANCY, URINE  RPR  HIV ANTIBODY (ROUTINE TESTING W REFLEX)  GC/CHLAMYDIA PROBE AMP ([place]) NOT AT [facility]    EKG None  Radiology No results found.  Procedures Procedures (including critical care time)  Medications Ordered in ED Medications - No data to display   Initial Impression / Assessment and Plan / ED Course  I have reviewed the triage vital signs and the nursing notes.  Pertinent labs & imaging results that were available during my care of the patient were reviewed by me and considered in my medical decision making (see chart for details).    33 year old female presenting today with urinary frequency, dysuria, and white vaginal discharge. Recently sexually active without protection. She reports hx of UTIs and states this feels similar. Unconcerned for pregnancy; pt has implant in arm. Considering pt is having vaginal discharge will need to perform pelvic exam today. No abdominal pain, nausea, vomiting, pelvic pain. Pt afebrile in the ED as well without tachycardia or tachypnea. Do not feel  patient needs additional bloodwork besides RPR and HIV per her request. Do not feel pt needs imaging at this time including pelvic ultrasound but may reconsider if pts has CMT or adnexal tenderness on exam.   No tenderness to adnexa or CMT during pelvic exam; pelvic ultrasound not ordered as very low suspicion for TOA or PID. Will awaiting U/A and wet prep results prior to discharge.   UTI with leuks and nitrites; will treat with Kelfex outpatient. Wet prep positive for BV as well. Discussed medication options with patient given she is breastfeeding currently; she would like to tr the vaginal gel instead of oral flagyl but is concerned her insurance may not cover it. Will write Rxs for both today and have patient check the price with pharmacy. She is advised to refrain from alcohol should she use the oral version. She is also advised to refrain from sexual intercourse for the next 10 days. Pt is in agreement with plan at this time and stable for discharge home.       Final Clinical Impressions(s) / ED Diagnoses   Final diagnoses:  Lower urinary tract infectious disease  BV (bacterial vaginosis)    ED Discharge Orders         Ordered    cephALEXin (KEFLEX) 500 MG capsule  4 times daily     07/05/19 1019    metroNIDAZOLE (FLAGYL) 500 MG tablet  2 times daily     07/05/19 1019    metroNIDAZOLE (METROGEL) 0.75 % vaginal gel  Daily     07/05/19 1019           Tanda RockersVenter, Gavriela Cashin, PA-C 07/05/19 1615    Terrilee FilesButler, Michael C, MD 07/06/19 [phone]

## 2019-07-06 LAB — RPR: RPR Ser Ql: NONREACTIVE

## 2019-07-06 LAB — HIV ANTIBODY (ROUTINE TESTING W REFLEX): HIV Screen 4th Generation wRfx: NONREACTIVE

## 2019-07-07 LAB — URINE CULTURE: Culture: >=100000 — AB

## 2019-07-08 ENCOUNTER — Telehealth: Payer: Self-pay | Admitting: Emergency Medicine

## 2019-07-08 NOTE — Telephone Encounter (Signed)
Post ED Visit - Positive Culture Follow-up  Culture report reviewed by antimicrobial stewardship pharmacist: [facility] Team []  Elenor Quinones, Pharm.D. []  Heide Guile, Pharm.D., BCPS AQ-ID []  Parks Neptune, Pharm.D., BCPS []  Alycia Rossetti, Pharm.D., BCPS []  [place], Pharm.D., BCPS, AAHIVP []  Legrand Como, Pharm.D., BCPS, AAHIVP []  Salome Arnt, PharmD, BCPS []  Johnnette Gourd, PharmD, BCPS [x]  Hughes Better, PharmD, BCPS []  Leeroy Cha, PharmD []  Laqueta Linden, PharmD, BCPS []  Albertina Parr, PharmD  [place] Team []  Leodis Sias, PharmD []  Lindell Spar, PharmD []  Royetta Asal, PharmD []  Graylin Shiver, Rph []  Rema Fendt) Glennon Mac, PharmD []  Arlyn Dunning, PharmD []  Netta Cedars, PharmD []  Dia Sitter, PharmD []  Leone Haven, PharmD []  Gretta Arab, PharmD []  Theodis Shove, PharmD []  Peggyann Juba, PharmD []  Reuel Boom, PharmD   Positive urine culture Treated with cephalexin, organism sensitive to the same and no further patient follow-up is required at this time.  Hazle Nordmann 07/08/2019, 10:47 AM

## 2019-07-09 LAB — GC/CHLAMYDIA PROBE AMP (~~LOC~~) NOT AT ARMC
Chlamydia: NEGATIVE
Neisseria Gonorrhea: NEGATIVE

## 2019-08-13 ENCOUNTER — Telehealth: Payer: Self-pay | Admitting: *Deleted

## 2019-08-13 ENCOUNTER — Other Ambulatory Visit: Payer: Self-pay | Admitting: Family Medicine

## 2019-08-13 DIAGNOSIS — I10 Essential (primary) hypertension: Secondary | ICD-10-CM

## 2019-08-13 DIAGNOSIS — K219 Gastro-esophageal reflux disease without esophagitis: Secondary | ICD-10-CM

## 2019-08-13 MED ORDER — OMEPRAZOLE 40 MG PO CPDR: DELAYED_RELEASE_CAPSULE | ORAL | 2 refills | Status: DC

## 2019-08-13 NOTE — Telephone Encounter (Signed)
Leah Walls called and left a message she is calling for a refill of omeprazole. I called Leah Walls and we discussed she delivered in November and had a refill in May and was told she would need to see a PCP for further refills. She states she hasn't yet. I asked if she had tried over the counter meds like zantac and she states she has tried and it doesn't work. I informed her she will need to start seeing a PCP for this ; but I will talk with a doctor and see if we can give 1 refill and will call her back.   Per further chart review found that Leah Walls was also supposed to be following up with a PCP because of htn at her pp visit. Will refer to PCP. Have sent referral to [facility]. They will notify her of appointment.

## 2019-08-13 NOTE — Telephone Encounter (Signed)
I called Leah Walls back and informed her one of our doctors sent in a refill of her omeprazole. I also informed her that I saw in her chart she had elevated blood pressure and was instructed to go to a PCP. I informed her I  Have sent in a referral to [facility] and she should get a call from them soon. I asked her to call us back if she does not get a call from them. She voices understanding. I also asked if she was having symptoms concerning for high blood pressure like severe headaches. She states she does have headaches sometimes and takes 2-3 tylenol to make them go away. I instructed her never to take 3 tylenol at once- that is too much for her kidneys. I advised her to follow instructions on the box. She voices understanding.

## 2019-08-22 ENCOUNTER — Other Ambulatory Visit: Payer: Self-pay

## 2019-08-22 ENCOUNTER — Telehealth: Payer: Self-pay | Admitting: *Deleted

## 2019-08-22 ENCOUNTER — Ambulatory Visit (HOSPITAL_COMMUNITY)
Admission: EM | Admit: 2019-08-22 | Discharge: 2019-08-22 | Disposition: A | Discharge status: Home / Self Care | Payer: Medicaid Other | Attending: Family Medicine | Admitting: Family Medicine

## 2019-08-22 ENCOUNTER — Encounter (HOSPITAL_COMMUNITY): Payer: Self-pay

## 2019-08-22 DIAGNOSIS — R05 Cough: Secondary | ICD-10-CM | POA: Diagnosis not present

## 2019-08-22 DIAGNOSIS — Z20828 Contact with and (suspected) exposure to other viral communicable diseases: Secondary | ICD-10-CM | POA: Diagnosis not present

## 2019-08-22 DIAGNOSIS — J069 Acute upper respiratory infection, unspecified: Secondary | ICD-10-CM | POA: Diagnosis not present

## 2019-08-22 DIAGNOSIS — B9789 Other viral agents as the cause of diseases classified elsewhere: Secondary | ICD-10-CM | POA: Diagnosis not present

## 2019-08-22 MED ORDER — HYDROCODONE-HOMATROPINE 5-1.5 MG/5ML PO SYRP: 5.0000 mL | ORAL_SOLUTION | Freq: Every evening | ORAL | 0 refills | Status: DC | PRN

## 2019-08-22 MED ORDER — CETIRIZINE HCL 10 MG PO CAPS: 10.0000 mg | ORAL_CAPSULE | Freq: Every day | ORAL | 0 refills | Status: DC

## 2019-08-22 MED ORDER — ALBUTEROL SULFATE HFA 108 (90 BASE) MCG/ACT IN AERS: 1.0000 | INHALATION_SPRAY | Freq: Four times a day (QID) | RESPIRATORY_TRACT | 0 refills | Status: DC | PRN

## 2019-08-22 MED ORDER — BENZONATATE 200 MG PO CAPS: 200.0000 mg | ORAL_CAPSULE | Freq: Three times a day (TID) | ORAL | 0 refills | Status: AC | PRN

## 2019-08-22 MED ORDER — FLUTICASONE PROPIONATE 50 MCG/ACT NA SUSP: 1.0000 | Freq: Every day | NASAL | 0 refills | Status: DC

## 2019-08-22 NOTE — Telephone Encounter (Addendum)
Rai called back as advised to report she has not heard anything back from [facility] re: her referral. I thanked her for her call and informed her I will fu with [facility] and get back to her.   email Message sent to [facility] Coordinator to follow up on referral. Per previous message packet was being sent to [facility]. I called Jerilyn back and left a message she should have received a packet- please call us back and confirm if you got a packet and also confirm your address.  Demetri Kerman,RN

## 2019-08-22 NOTE — Telephone Encounter (Signed)
Leah Walls called back. She confirmed her address has changed. I updated in the system. I notified her that she can pick up packet or they will mail it; also that I will send them a message; but they may still call her. I informed her once she returns the packet they will make her appointment.  Trice Aspinall,RN

## 2019-08-22 NOTE — ED Triage Notes (Signed)
Patient presents to Urgent Care with complaints of cough and SOB since the day before yesterday. Patient reports she does not have an inhaler to help her symptoms, no hx of asthma.

## 2019-08-22 NOTE — Discharge Instructions (Addendum)
Begin daily cetirizine to help with congestion and drainage Flonase nasal spray 1 to 2 spray in each nostril daily to help with congestion and any sinus pressure Tessalon for cough during the day Hycodan cough syrup at nighttime Albuterol inhaler as needed for shortness of breath, wheezing  COVID swab pending.  Please follow-up if symptoms not resolving, worsening, developing increased shortness of breath, difficulty breathing, chest discomfort, fevers

## 2019-08-23 NOTE — ED Provider Notes (Signed)
[facility]    CSN: [id number] Arrival date & time: 08/22/19  1718      History   Chief Complaint Chief Complaint  Patient presents with  . Shortness of Breath  . Cough    HPI Leah Walls is a 33 y.o. female presenting today for evaluation of cough and shortness of breath.  Patient states that over the past 2 to 3 days she has had a cough, congestion and has developed some shortness of breath.  She has noted that she has had some intermittent wheezing.  Denies history of asthma.  She has not noted any fevers.  Denies known exposure to COVID.  Denies chest pain.  She has tried some Benadryl without relief.  HPI  Past Medical History:  Diagnosis Date  . Gestational diabetes   . Headache   . Hernia, abdominal 2011  . Supervision of other normal pregnancy, antepartum 05/17/2018    Nursing Staff Provider Office Location  [facility] Dating   Language  English Anatomy US  Normal female Flu Vaccine  Declined  Genetic Screen   NIPS: low risk  TDaP vaccine   08/27/18 Hgb A1C or  GTT Early  Third trimester: GDM  Rhogam  n/a   LAB RESULTS  Feeding Plan Breast Blood Type O/Positive/-- (05/31 1059) O pos Contraception nexplanon Antibody Negative (05/31 1059)neg Circumcision Female  Rub    Patient Active Problem List   Diagnosis Date Noted  . Gestational diabetes 10/29/2018  . VBAC, delivered 10/29/2018  . GDM, class A2 08/29/2018  . History of preterm delivery 08/27/2018  . Supervision of high-risk pregnancy 05/17/2018  . UTI (urinary tract infection) during pregnancy 04/05/2018  . Umbilical hernia 11/15/2016  . Genital herpes affecting pregnancy, antepartum 10/18/2016  . Marijuana use 07/30/2016  . History of VBAC x4 07/24/2016  . [place] multiparity, antepartum 07/24/2016  . Breast lump on right side at 1 o'clock position 07/24/2016    Past Surgical History:  Procedure Laterality Date  . CESAREAN SECTION    . DILATION AND EVACUATION Bilateral 07/14/2015   Procedure:  DILATATION AND EVACUATION;  Surgeon: Kathreen CosierBernard A Marshall, MD;  Location: [facility];  Service: Gynecology;  Laterality: Bilateral;  . FOREARM FRACTURE SURGERY Right 2008  . WISDOM TOOTH EXTRACTION      OB History    Gravida  7   Para  7   Term  6   Preterm  1   AB  0   Living  7     SAB  0   TAB  0   Ectopic  0   Multiple  0   Live Births  5            Home Medications    Prior to Admission medications   Medication Sig Start Date End Date Taking? Authorizing Provider  albuterol (VENTOLIN HFA) 108 (90 Base) MCG/ACT inhaler Inhale 1-2 puffs into the lungs every 6 (six) hours as needed for wheezing or shortness of breath. 08/22/19   Yetunde Leis C, PA-C  amoxicillin-clavulanate (AUGMENTIN) 875-125 MG tablet Take 1 tablet by mouth 2 (two) times daily. One po bid x 7 days 03/17/19   Muthersbaugh, Dahlia ClientHannah, PA-C  benzonatate (TESSALON) 200 MG capsule Take 1 capsule (200 mg total) by mouth 3 (three) times daily as needed for up to 7 days for cough (daytime cough). 08/22/19 08/29/19  Guiselle Mian C, PA-C  cephALEXin (KEFLEX) 500 MG capsule Take 1 capsule (500 mg total) by mouth 4 (four) times daily.  07/05/19   Eustaquio Maize, PA-C  Cetirizine HCl 10 MG CAPS Take 1 capsule (10 mg total) by mouth daily for 10 days. 08/22/19 09/01/19  Alyshia Kernan C, PA-C  fluticasone (FLONASE) 50 MCG/ACT nasal spray Place 1-2 sprays into both nostrils daily for 7 days. 08/22/19 08/29/19  Abbe Bula C, PA-C  HYDROcodone-homatropine (HYCODAN) 5-1.5 MG/5ML syrup Take 5 mLs by mouth at bedtime as needed for cough. 08/22/19   Margrit Minner C, PA-C  ibuprofen (ADVIL,MOTRIN) 600 MG tablet Take 1 tablet (600 mg total) by mouth every 6 (six) hours. 10/31/18   Caroline More, DO  metroNIDAZOLE (FLAGYL) 500 MG tablet Take 1 tablet (500 mg total) by mouth 2 (two) times daily. 07/05/19   Eustaquio Maize, PA-C  omeprazole (PRILOSEC) 40 MG capsule TAKE 1 CAPSULE BY MOUTH EVERY DAY 08/13/19   Donnamae Jude, MD   phenazopyridine (PYRIDIUM) 200 MG tablet Take 1 tablet (200 mg total) by mouth 3 (three) times daily. 02/21/19   McDonald, Mia A, PA-C  Prenat-FeAsp-Meth-FA-DHA w/o A (PRENATE PIXIE) 10-0.6-0.4-200 MG CAPS Take 1 tablet by mouth daily. 04/15/19   Rasch, Anderson Malta I, NP  amLODipine (NORVASC) 5 MG tablet Take 1 tablet (5 mg total) by mouth daily. 12/05/18 08/22/19  Rasch, Anderson Malta I, NP  promethazine (PHENERGAN) 25 MG tablet Take 1 tablet (25 mg total) by mouth every 6 (six) hours as needed for nausea or vomiting. Patient not taking: Reported on 12/05/2018 10/25/18 08/22/19  Osborne Oman, MD    Family History Family History  Problem Relation Age of Onset  . Hypertension Mother   . Hypertension Maternal Grandmother   . Hypertension Paternal Grandmother   . Hypertension Father     Social History Social History   Tobacco Use  . Smoking status: Current Some Day Smoker    Types: Cigarettes  . Smokeless tobacco: Never Used  . Tobacco comment: 2-3 on the days she does smoke  Substance Use Topics  . Alcohol use: No  . Drug use: No     Allergies   Patient has no known allergies.   Review of Systems Review of Systems  Constitutional: Positive for fatigue. Negative for activity change, appetite change, chills and fever.  HENT: Positive for congestion and rhinorrhea. Negative for ear pain, sinus pressure, sore throat and trouble swallowing.   Eyes: Negative for discharge and redness.  Respiratory: Positive for cough. Negative for chest tightness and shortness of breath.   Cardiovascular: Negative for chest pain.  Gastrointestinal: Negative for abdominal pain, diarrhea, nausea and vomiting.  Musculoskeletal: Negative for myalgias.  Skin: Negative for rash.  Neurological: Positive for headaches. Negative for dizziness and light-headedness.     Physical Exam Triage Vital Signs ED Triage Vitals  Enc Vitals Group     BP 08/22/19 1807 112/82     Pulse Rate 08/22/19 1807 86     Resp  08/22/19 1807 17     Temp 08/22/19 1807 (!) 97.5 F (36.4 C)     Temp Source 08/22/19 1807 Temporal     SpO2 08/22/19 1807 99 %     Weight --      Height --      Head Circumference --      Peak Flow --      Pain Score 08/22/19 1804 0     Pain Loc --      Pain Edu? --      Excl. in [place]? --    No data found.  Updated Vital Signs BP  112/82 (BP Location: Right Arm)   Pulse 86   Temp (!) 97.5 F (36.4 C) (Temporal)   Resp 17   SpO2 99%   Visual Acuity Right Eye Distance:   Left Eye Distance:   Bilateral Distance:    Right Eye Near:   Left Eye Near:    Bilateral Near:     Physical Exam Vitals signs and nursing note reviewed.  Constitutional:      General: She is not in acute distress.    Appearance: She is well-developed.     Comments: Appears tired  HENT:     Head: Normocephalic and atraumatic.     Ears:     Comments: Bilateral ears without tenderness to palpation of external auricle, tragus and mastoid, EAC's without erythema or swelling, TM's with good bony landmarks and cone of light. Non erythematous.     Nose:     Comments: Nasal mucosa erythematous, swollen turbinates bilaterally, no rhinorrhea    Mouth/Throat:     Comments: Oral mucosa pink and moist, no tonsillar enlargement or exudate. Posterior pharynx patent and nonerythematous, no uvula deviation or swelling. Normal phonation.  Eyes:     Conjunctiva/sclera: Conjunctivae normal.  Neck:     Musculoskeletal: Neck supple.  Cardiovascular:     Rate and Rhythm: Normal rate and regular rhythm.     Heart sounds: No murmur.  Pulmonary:     Effort: Pulmonary effort is normal. No respiratory distress.     Breath sounds: Normal breath sounds.     Comments: Breathing comfortably at rest, CTABL, no wheezing, rales or other adventitious sounds auscultated Coarse cough frequently throughout visit Abdominal:     Palpations: Abdomen is soft.     Tenderness: There is no abdominal tenderness.  Skin:    General: Skin  is warm and dry.  Neurological:     Mental Status: She is alert.      UC Treatments / Results  Labs (all labs ordered are listed, but only abnormal results are displayed) Labs Reviewed  NOVEL CORONAVIRUS, NAA (HOSP ORDER, SEND-OUT TO REF LAB; TAT 18-24 HRS)    EKG   Radiology No results found.  Procedures Procedures (including critical care time)  Medications Ordered in UC Medications - No data to display  Initial Impression / Assessment and Plan / UC Course  I have reviewed the triage vital signs and the nursing notes.  Pertinent labs & imaging results that were available during my care of the patient were reviewed by me and considered in my medical decision making (see chart for details).     URI symptoms with cough x2 to 3 days, vital signs stable without fever, tachycardia or hypoxia.  COVID swab pending.  Recommending symptomatic and supportive care as likely viral etiology.  Lungs clear at this time.  Does have reported wheezing, will provide albuterol to use as needed.  Zyrtec and Flonase for congestion/drainage.  Tessalon for cough during the day, Hycodan for nighttime cough.  Rest, push fluids.  Continue to monitor cough, temperature as well as shortness of breath.  Follow-up if symptoms not resolving with the above.Discussed strict return precautions. Patient verbalized understanding and is agreeable with plan.  Final Clinical Impressions(s) / UC Diagnoses   Final diagnoses:  Viral URI with cough     Discharge Instructions     Begin daily cetirizine to help with congestion and drainage Flonase nasal spray 1 to 2 spray in each nostril daily to help with congestion and any sinus pressure Tessalon  for cough during the day Hycodan cough syrup at nighttime Albuterol inhaler as needed for shortness of breath, wheezing  COVID swab pending.  Please follow-up if symptoms not resolving, worsening, developing increased shortness of breath, difficulty breathing,  chest discomfort, fevers   ED Prescriptions    Medication Sig Dispense Auth. Provider   benzonatate (TESSALON) 200 MG capsule Take 1 capsule (200 mg total) by mouth 3 (three) times daily as needed for up to 7 days for cough (daytime cough). 28 capsule Zahara Rembert C, PA-C   HYDROcodone-homatropine (HYCODAN) 5-1.5 MG/5ML syrup Take 5 mLs by mouth at bedtime as needed for cough. 60 mL Nyle Limb C, PA-C   fluticasone (FLONASE) 50 MCG/ACT nasal spray Place 1-2 sprays into both nostrils daily for 7 days. 1 g Floyde Dingley C, PA-C   Cetirizine HCl 10 MG CAPS Take 1 capsule (10 mg total) by mouth daily for 10 days. 10 capsule Lora Chavers C, PA-C   albuterol (VENTOLIN HFA) 108 (90 Base) MCG/ACT inhaler Inhale 1-2 puffs into the lungs every 6 (six) hours as needed for wheezing or shortness of breath. 18 g Shatisha Falter, [place] Randall[place]ie C, PA-C     Controlled Substance Prescriptions [place] Controlled Substance Registry consulted? Yes, I have consulted the [place] Controlled Substances Registry for this patient, and feel the risk/benefit ratio today is favorable for proceeding with this prescription for a controlled substance.   Sharyon CableWieters, [place]Hallie C, [place] JerseyPA-C 08/23/19 [phone]

## 2019-08-24 LAB — NOVEL CORONAVIRUS, NAA (HOSP ORDER, SEND-OUT TO REF LAB; TAT 18-24 HRS): SARS-CoV-2, NAA: NOT DETECTED

## 2019-08-25 ENCOUNTER — Encounter (HOSPITAL_COMMUNITY): Payer: Self-pay

## 2019-09-08 ENCOUNTER — Emergency Department (HOSPITAL_COMMUNITY): Payer: Medicaid Other

## 2019-09-08 ENCOUNTER — Emergency Department (HOSPITAL_COMMUNITY)
Admission: EM | Admit: 2019-09-08 | Discharge: 2019-09-08 | Disposition: A | Discharge status: Home / Self Care | Payer: Medicaid Other | Attending: Emergency Medicine | Admitting: Emergency Medicine

## 2019-09-08 ENCOUNTER — Other Ambulatory Visit: Payer: Self-pay

## 2019-09-08 ENCOUNTER — Encounter (HOSPITAL_COMMUNITY): Payer: Self-pay | Admitting: Emergency Medicine

## 2019-09-08 DIAGNOSIS — Y999 Unspecified external cause status: Secondary | ICD-10-CM | POA: Insufficient documentation

## 2019-09-08 DIAGNOSIS — Y92410 Unspecified street and highway as the place of occurrence of the external cause: Secondary | ICD-10-CM | POA: Insufficient documentation

## 2019-09-08 DIAGNOSIS — F17210 Nicotine dependence, cigarettes, uncomplicated: Secondary | ICD-10-CM | POA: Insufficient documentation

## 2019-09-08 DIAGNOSIS — Y9389 Activity, other specified: Secondary | ICD-10-CM | POA: Insufficient documentation

## 2019-09-08 DIAGNOSIS — Z79899 Other long term (current) drug therapy: Secondary | ICD-10-CM | POA: Diagnosis not present

## 2019-09-08 DIAGNOSIS — S5011XA Contusion of right forearm, initial encounter: Secondary | ICD-10-CM | POA: Insufficient documentation

## 2019-09-08 DIAGNOSIS — M79631 Pain in right forearm: Secondary | ICD-10-CM | POA: Diagnosis not present

## 2019-09-08 DIAGNOSIS — S59911A Unspecified injury of right forearm, initial encounter: Secondary | ICD-10-CM | POA: Diagnosis not present

## 2019-09-08 MED ORDER — IBUPROFEN 200 MG PO TABS
600.0000 mg | ORAL_TABLET | Freq: Once | ORAL | Status: AC
  Administered 2019-09-08: 11:00:00 600 mg via ORAL
  Filled 2019-09-08: qty 3
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
## 2019-09-08 NOTE — Discharge Instructions (Addendum)
Apply ice to help with the swelling.  Take over-the-counter medications as needed for pain.  Your symptoms should resolve over the next week or 2.  Please review the discharge instructions for additional information

## 2019-09-19 ENCOUNTER — Encounter: Payer: Self-pay | Admitting: Family Medicine

## 2019-09-19 ENCOUNTER — Ambulatory Visit: Payer: Medicaid Other | Admitting: Family Medicine

## 2019-09-19 ENCOUNTER — Ambulatory Visit (INDEPENDENT_AMBULATORY_CARE_PROVIDER_SITE_OTHER): Payer: Medicaid Other | Admitting: Family Medicine

## 2019-09-19 ENCOUNTER — Other Ambulatory Visit: Payer: Self-pay

## 2019-09-19 DIAGNOSIS — M79671 Pain in right foot: Secondary | ICD-10-CM | POA: Diagnosis not present

## 2019-09-19 DIAGNOSIS — Z8632 Personal history of gestational diabetes: Secondary | ICD-10-CM | POA: Diagnosis not present

## 2019-09-19 DIAGNOSIS — M79672 Pain in left foot: Secondary | ICD-10-CM

## 2019-09-19 DIAGNOSIS — K219 Gastro-esophageal reflux disease without esophagitis: Secondary | ICD-10-CM | POA: Diagnosis not present

## 2019-09-19 MED ORDER — CYCLOBENZAPRINE HCL 10 MG PO TABS: 10.0000 mg | ORAL_TABLET | Freq: Three times a day (TID) | ORAL | 1 refills | Status: DC | PRN

## 2019-09-19 MED ORDER — IBUPROFEN 600 MG PO TABS: 600.0000 mg | ORAL_TABLET | Freq: Four times a day (QID) | ORAL | 0 refills | Status: DC
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
## 2019-09-19 NOTE — Assessment & Plan Note (Signed)
Continue PPI, r/o H. Pylori. Consider GI referral.

## 2019-09-19 NOTE — Assessment & Plan Note (Signed)
Negative 2 hour pp, needs yearly f/u

## 2019-09-19 NOTE — Assessment & Plan Note (Signed)
Ongoing, will refer to podiatry and see if she might benefit from orthotics, etc.

## 2019-09-22 ENCOUNTER — Other Ambulatory Visit: Payer: Self-pay | Admitting: Family Medicine

## 2019-09-22 DIAGNOSIS — K219 Gastro-esophageal reflux disease without esophagitis: Secondary | ICD-10-CM

## 2019-09-22 LAB — H. PYLORI ANTIBODY, IGG: H. pylori, IgG AbS: 0.2 Index Value (ref 0.00–0.79)

## 2019-09-23 ENCOUNTER — Encounter: Payer: Self-pay | Admitting: Gastroenterology

## 2019-09-30 ENCOUNTER — Ambulatory Visit (INDEPENDENT_AMBULATORY_CARE_PROVIDER_SITE_OTHER): Payer: Medicaid Other

## 2019-09-30 ENCOUNTER — Ambulatory Visit: Payer: Medicaid Other | Admitting: Sports Medicine

## 2019-09-30 ENCOUNTER — Other Ambulatory Visit: Payer: Self-pay | Admitting: Sports Medicine

## 2019-09-30 ENCOUNTER — Other Ambulatory Visit: Payer: Self-pay

## 2019-09-30 ENCOUNTER — Encounter: Payer: Self-pay | Admitting: Sports Medicine

## 2019-09-30 DIAGNOSIS — M722 Plantar fascial fibromatosis: Secondary | ICD-10-CM | POA: Diagnosis not present

## 2019-09-30 DIAGNOSIS — M2142 Flat foot [pes planus] (acquired), left foot: Secondary | ICD-10-CM

## 2019-09-30 DIAGNOSIS — M79672 Pain in left foot: Secondary | ICD-10-CM

## 2019-09-30 DIAGNOSIS — M2141 Flat foot [pes planus] (acquired), right foot: Secondary | ICD-10-CM

## 2019-09-30 DIAGNOSIS — M79671 Pain in right foot: Secondary | ICD-10-CM

## 2019-09-30 MED ORDER — MELOXICAM 15 MG PO TABS: 15.0000 mg | ORAL_TABLET | Freq: Every day | ORAL | 0 refills | Status: DC

## 2019-09-30 MED ORDER — PREDNISONE 10 MG (21) PO TBPK: ORAL_TABLET | ORAL | 0 refills | Status: DC

## 2019-09-30 NOTE — Progress Notes (Signed)
Subjective: Leah Walls is a 33 y.o. female patient presents to office with complaint of moderate plantar pain on the left and right and pain at right ankle. Patient admits to post static dyskinesia for months slowly getting worse over the last 4 months in duration. Patient has treated this problem with rest, insoles, and changing shoes with no relief. Reports can not stand/walk more than 20 mins pain is intense feels sick because of the pain. Denies any other pedal complaints.   Patient is not breast feeding.  Review of Systems  All other systems reviewed and are negative.   Patient Active Problem List   Diagnosis Date Noted  . Gastroesophageal reflux disease without esophagitis 09/19/2019  . Pain in both feet 09/19/2019  . History of gestational diabetes 08/29/2018  . Umbilical hernia 33/82/5053  . Genital herpes 10/18/2016  . Marijuana use 07/30/2016  . Breast lump on right side at 1 o'clock position 07/24/2016    Current Outpatient Medications on File Prior to Visit  Medication Sig Dispense Refill  . albuterol (VENTOLIN HFA) 108 (90 Base) MCG/ACT inhaler Inhale 1-2 puffs into the lungs every 6 (six) hours as needed for wheezing or shortness of breath. 18 g 0  . cyclobenzaprine (FLEXERIL) 10 MG tablet Take 1 tablet (10 mg total) by mouth every 8 (eight) hours as needed for muscle spasms. 30 tablet 1  . ibuprofen (ADVIL) 600 MG tablet Take 1 tablet (600 mg total) by mouth every 6 (six) hours. 30 tablet 0  . Prenat-FeAsp-Meth-FA-DHA w/o A (PRENATE PIXIE) 10-0.6-0.4-200 MG CAPS Take 1 tablet by mouth daily. 30 capsule 3  . [DISCONTINUED] amLODipine (NORVASC) 5 MG tablet Take 1 tablet (5 mg total) by mouth daily. 30 tablet 0  . [DISCONTINUED] promethazine (PHENERGAN) 25 MG tablet Take 1 tablet (25 mg total) by mouth every 6 (six) hours as needed for nausea or vomiting. (Patient not taking: Reported on 12/05/2018) 30 tablet 0   No current facility-administered medications on file prior  to visit.     No Known Allergies  Objective: Physical Exam General: The patient is alert and oriented x3 in no acute distress.  Dermatology: Skin is warm, dry and supple bilateral lower extremities. Nails 1-10 are normal. There is no erythema, edema, no eccymosis, no open lesions present. Integument is otherwise unremarkable.  Vascular: Dorsalis Pedis pulse and Posterior Tibial pulse are 2/4 bilateral. Capillary fill time is immediate to all digits.  Neurological: Grossly intact to light touch with an achilles reflex of +2/5 and a  negative Tinel's sign bilateral.  Musculoskeletal: Tenderness to palpation at the medial calcaneal tubercale and through the insertion of the plantar fascia on the left and right foot. Clicking on right ankle. No pain with compression of calcaneus bilateral. No pain with tuning fork to calcaneus bilateral. No pain with calf compression bilateral. There is decreased Ankle joint range of motion bilateral. All other joints range of motion within normal limits bilateral. Strength 5/5 in all groups bilateral.   Gait: Unassisted, Antalgic   Xray, Right/Left foot:  Normal osseous mineralization. Joint spaces preserved. No fracture/dislocation/boney destruction. Calcaneal spur present with mild thickening of plantar fascia. No other soft tissue abnormalities or radiopaque foreign bodies.   Assessment and Plan: Problem List Items Addressed This Visit      Other   Pain in both feet   Relevant Orders   DG Foot 2 Views Right   DG Foot 2 Views Left    Other Visit Diagnoses    Bilateral  plantar fasciitis    -  Primary   Bilateral pes planus          -Complete examination performed.  -Xrays reviewed -Discussed with patient in detail the condition of plantar fasciitis and subluxation at ankle on right due to pes planus, how this occurs and general treatment options. Explained both conservative and surgical treatments.  -Rx Meloxicam to start after Prednisone dose  pack is completed -Recommended good supportive shoes and advised use of OTC insert. -Explained and dispensed to patient daily stretching exercises. -Recommend patient to ice affected area 1-2x daily. -Patient to return to office in 4 weeks for follow up or sooner if problems or questions arise. MRI right if no better.    Asencion Islam, DPM

## 2019-10-13 ENCOUNTER — Other Ambulatory Visit: Payer: Self-pay

## 2019-10-13 ENCOUNTER — Emergency Department (HOSPITAL_COMMUNITY)
Admission: EM | Admit: 2019-10-13 | Discharge: 2019-10-13 | Disposition: A | Discharge status: Home / Self Care | Payer: Medicaid Other | Attending: Emergency Medicine | Admitting: Emergency Medicine

## 2019-10-13 ENCOUNTER — Encounter (HOSPITAL_COMMUNITY): Payer: Self-pay | Admitting: Emergency Medicine

## 2019-10-13 DIAGNOSIS — N10 Acute tubulo-interstitial nephritis: Secondary | ICD-10-CM | POA: Insufficient documentation

## 2019-10-13 DIAGNOSIS — N12 Tubulo-interstitial nephritis, not specified as acute or chronic: Secondary | ICD-10-CM | POA: Diagnosis not present

## 2019-10-13 DIAGNOSIS — F17210 Nicotine dependence, cigarettes, uncomplicated: Secondary | ICD-10-CM | POA: Diagnosis not present

## 2019-10-13 DIAGNOSIS — R300 Dysuria: Secondary | ICD-10-CM | POA: Diagnosis present

## 2019-10-13 LAB — BASIC METABOLIC PANEL
Anion gap: 8 (ref 5–15)
BUN: 12 mg/dL (ref 6–20)
CO2: 26 mmol/L (ref 22–32)
Calcium: 9.2 mg/dL (ref 8.9–10.3)
Chloride: 104 mmol/L (ref 98–111)
Creatinine, Ser: 1.01 mg/dL — ABNORMAL HIGH (ref 0.44–1.00)
GFR calc Af Amer: >60 mL/min (ref >60)
GFR calc non Af Amer: >60 mL/min (ref >60)
Glucose, Bld: 93 mg/dL (ref 70–99)
Potassium: 3.9 mmol/L (ref 3.5–5.1)
Sodium: 138 mmol/L (ref 135–145)

## 2019-10-13 LAB — WET PREP, GENITAL
Clue Cells Wet Prep HPF POC: NONE SEEN
Sperm: NONE SEEN
Trich, Wet Prep: NONE SEEN
Yeast Wet Prep HPF POC: NONE SEEN

## 2019-10-13 LAB — URINALYSIS, ROUTINE W REFLEX MICROSCOPIC
Bilirubin Urine: NEGATIVE
Glucose, UA: 50 mg/dL — AB
Ketones, ur: NEGATIVE mg/dL
Leukocytes,Ua: NEGATIVE
Nitrite: POSITIVE — AB
Protein, ur: 30 mg/dL — AB
Specific Gravity, Urine: 1.015 (ref 1.005–1.030)
WBC, UA: >50 WBC/hpf — ABNORMAL HIGH (ref 0–5)
pH: 5 (ref 5.0–8.0)

## 2019-10-13 LAB — PREGNANCY, URINE: Preg Test, Ur: NEGATIVE

## 2019-10-13 LAB — CBC WITH DIFFERENTIAL/PLATELET
Abs Immature Granulocytes: 0.04 10*3/uL (ref 0.00–0.07)
Basophils Absolute: 0 10*3/uL (ref 0.0–0.1)
Basophils Relative: 1 %
Eosinophils Absolute: 0.2 10*3/uL (ref 0.0–0.5)
Eosinophils Relative: 2 %
HCT: 39 % (ref 36.0–46.0)
Hemoglobin: 12 g/dL (ref 12.0–15.0)
Immature Granulocytes: 1 %
Lymphocytes Relative: 38 %
Lymphs Abs: 3.1 10*3/uL (ref 0.7–4.0)
MCH: 29.1 pg (ref 26.0–34.0)
MCHC: 30.8 g/dL (ref 30.0–36.0)
MCV: 94.4 fL (ref 80.0–100.0)
Monocytes Absolute: 0.4 10*3/uL (ref 0.1–1.0)
Monocytes Relative: 5 %
Neutro Abs: 4.3 10*3/uL (ref 1.7–7.7)
Neutrophils Relative %: 53 %
Platelets: 295 10*3/uL (ref 150–400)
RBC: 4.13 MIL/uL (ref 3.87–5.11)
RDW: 13.2 % (ref 11.5–15.5)
WBC: 8 10*3/uL (ref 4.0–10.5)
nRBC: 0 % (ref 0.0–0.2)

## 2019-10-13 LAB — HIV ANTIBODY (ROUTINE TESTING W REFLEX): HIV Screen 4th Generation wRfx: NONREACTIVE

## 2019-10-13 MED ORDER — SODIUM CHLORIDE 0.9 % IV SOLN: 1.0000 g | Freq: Once | INTRAVENOUS | Status: DC

## 2019-10-13 MED ORDER — CEPHALEXIN 500 MG PO CAPS: 500.0000 mg | ORAL_CAPSULE | Freq: Four times a day (QID) | ORAL | 0 refills | Status: DC

## 2019-10-13 NOTE — ED Provider Notes (Signed)
[facility] Note   CSN: [id number] Arrival date & time: 10/13/19  1107     History   Chief Complaint Chief Complaint  Patient presents with  . Dysuria    HPI Leah Walls is a 33 y.o. female with a hx of tobacco abuse, genital herpes, & GERD who presents to the ED w/ complaints of dysuria x 1 week.  Patient states she had progressively worsening urinary symptoms including dysuria, urgency, and frequency.  She has also noted some mild right sided back discomfort with her urinary symptoms, no severe pain.  Symptoms mildly alleviated by over-the-counter Pyridium.  Notes intermittent vaginal discharge is not necessarily typical for her.  She is currently sexually active but does not have concern for STDs.  Denies fever, chills, nausea, vomiting, abdominal pain, or vaginal bleeding.  Unknown LMP as she takes birth control.  States this feels like prior UTIs.     HPI  Past Medical History:  Diagnosis Date  . Gestational diabetes   . Headache   . Hernia, abdominal 2011  . Supervision of other normal pregnancy, antepartum 05/17/2018    Nursing Staff Provider Office Location  [facility] Dating   Language  English Anatomy US  Normal female Flu Vaccine  Declined  Genetic Screen   NIPS: low risk  TDaP vaccine   08/27/18 Hgb A1C or  GTT Early  Third trimester: GDM  Rhogam  n/a   LAB RESULTS  Feeding Plan Breast Blood Type O/Positive/-- (05/31 1059) O pos Contraception nexplanon Antibody Negative (05/31 1059)neg Circumcision Female  Rub    Patient Active Problem List   Diagnosis Date Noted  . Gastroesophageal reflux disease without esophagitis 09/19/2019  . Pain in both feet 09/19/2019  . History of gestational diabetes 08/29/2018  . Umbilical hernia 11/15/2016  . Genital herpes 10/18/2016  . Marijuana use 07/30/2016  . Breast lump on right side at 1 o'clock position 07/24/2016    Past Surgical History:  Procedure Laterality Date  . CESAREAN  SECTION    . DILATION AND EVACUATION Bilateral 07/14/2015   Procedure: DILATATION AND EVACUATION;  Surgeon: Kathreen Cosier, MD;  Location: [facility];  Service: Gynecology;  Laterality: Bilateral;  . FOREARM FRACTURE SURGERY Right 2008  . WISDOM TOOTH EXTRACTION       OB History    Gravida  7   Para  7   Term  6   Preterm  1   AB  0   Living  7     SAB  0   TAB  0   Ectopic  0   Multiple  0   Live Births  5            Home Medications    Prior to Admission medications   Medication Sig Start Date End Date Taking? Authorizing Provider  albuterol (VENTOLIN HFA) 108 (90 Base) MCG/ACT inhaler Inhale 1-2 puffs into the lungs every 6 (six) hours as needed for wheezing or shortness of breath. 08/22/19   Wieters, Hallie C, PA-C  cyclobenzaprine (FLEXERIL) 10 MG tablet Take 1 tablet (10 mg total) by mouth every 8 (eight) hours as needed for muscle spasms. 09/19/19   Reva Bores, MD  ibuprofen (ADVIL) 600 MG tablet Take 1 tablet (600 mg total) by mouth every 6 (six) hours. 09/19/19   Reva Bores, MD  meloxicam (MOBIC) 15 MG tablet Take 1 tablet (15 mg total) by mouth daily. 09/30/19   Asencion Islam, DPM  predniSONE (STERAPRED UNI-PAK 21 TAB) 10 MG (21) TBPK tablet Take as directed 09/30/19   Landis Martins, DPM  Prenat-FeAsp-Meth-FA-DHA w/o A (PRENATE PIXIE) 10-0.6-0.4-200 MG CAPS Take 1 tablet by mouth daily. 04/15/19   Rasch, Anderson Malta I, NP  amLODipine (NORVASC) 5 MG tablet Take 1 tablet (5 mg total) by mouth daily. 12/05/18 08/22/19  Rasch, Anderson Malta I, NP  promethazine (PHENERGAN) 25 MG tablet Take 1 tablet (25 mg total) by mouth every 6 (six) hours as needed for nausea or vomiting. Patient not taking: Reported on 12/05/2018 10/25/18 08/22/19  Osborne Oman, MD    Family History Family History  Problem Relation Age of Onset  . Hypertension Mother   . Hypertension Maternal Grandmother   . Hypertension Paternal Grandmother   . Hypertension Father     Social  History Social History   Tobacco Use  . Smoking status: Current Some Day Smoker    Types: Cigarettes  . Smokeless tobacco: Never Used  . Tobacco comment: 2-3 on the days she does smoke  Substance Use Topics  . Alcohol use: No  . Drug use: No     Allergies   Patient has no known allergies.   Review of Systems Review of Systems  Constitutional: Negative for chills and fever.  Respiratory: Negative for shortness of breath.   Cardiovascular: Negative for chest pain.  Gastrointestinal: Negative for abdominal pain.  Genitourinary: Positive for dysuria, frequency, urgency and vaginal discharge. Negative for vaginal bleeding.  Musculoskeletal: Positive for back pain.  Neurological: Negative for weakness and numbness.  All other systems reviewed and are negative.    Physical Exam Updated Vital Signs BP 110/68 (BP Location: Right Arm)   Pulse 94   Temp 99.8 F (37.7 C) (Oral)   Resp 16   Ht 4\' 11"  (1.499 m)   Wt 69.4 kg   SpO2 98%   BMI 30.90 kg/m   Physical Exam Vitals signs and nursing note reviewed. Exam conducted with a chaperone present.  Constitutional:      General: She is not in acute distress.    Appearance: She is well-developed. She is not toxic-appearing.  HENT:     Head: Normocephalic and atraumatic.  Eyes:     General:        Right eye: No discharge.        Left eye: No discharge.     Conjunctiva/sclera: Conjunctivae normal.  Neck:     Musculoskeletal: Neck supple.  Cardiovascular:     Rate and Rhythm: Normal rate and regular rhythm.  Pulmonary:     Effort: Pulmonary effort is normal. No respiratory distress.     Breath sounds: Normal breath sounds. No wheezing, rhonchi or rales.  Abdominal:     General: There is no distension.     Palpations: Abdomen is soft.     Tenderness: There is no abdominal tenderness. There is no right CVA tenderness, left CVA tenderness, guarding or rebound.  Genitourinary:    Labia:        Right: No lesion.         Left: No lesion.      Vagina: Vaginal discharge (mild amount, thin) present. No bleeding.     Cervix: No cervical motion tenderness.     Adnexa:        Right: No mass or tenderness.         Left: No mass or tenderness.    Musculoskeletal:     Comments: No midline or paraspinal muscle tenderness to palpation.  Skin:    General: Skin is warm and dry.     Findings: No rash.  Neurological:     General: No focal deficit present.     Mental Status: She is alert.     Comments: Clear speech.   Psychiatric:        Behavior: Behavior normal.    ED Treatments / Results  Labs (all labs ordered are listed, but only abnormal results are displayed) Labs Reviewed  WET PREP, GENITAL - Abnormal; Notable for the following components:      Result Value   WBC, Wet Prep HPF POC FEW (*)    All other components within normal limits  URINALYSIS, ROUTINE W REFLEX MICROSCOPIC - Abnormal; Notable for the following components:   Color, Urine AMBER (*)    Glucose, UA 50 (*)    Hgb urine dipstick SMALL (*)    Protein, ur 30 (*)    Nitrite POSITIVE (*)    WBC, UA >50 (*)    Bacteria, UA RARE (*)    All other components within normal limits  BASIC METABOLIC PANEL - Abnormal; Notable for the following components:   Creatinine, Ser 1.01 (*)    All other components within normal limits  URINE CULTURE  PREGNANCY, URINE  CBC WITH DIFFERENTIAL/PLATELET  RPR  HIV ANTIBODY (ROUTINE TESTING W REFLEX)  GC/CHLAMYDIA PROBE AMP ([place]) NOT AT [facility]    EKG None  Radiology No results found.  Procedures Procedures (including critical care time)  Medications Ordered in ED   Initial Impression / Assessment and Plan / ED Course  I have reviewed the triage vital signs and the nursing notes.  Pertinent labs & imaging results that were available during my care of the patient were reviewed by me and considered in my medical decision making (see chart for details).   Patient presents to the emergency  department with complaints of urinary symptoms and right-sided back pain.  Patient is nontoxic-appearing, no apparent distress, vitals WNL.  Reports of mild right-sided back pain, no colicky nature, does not seem consistent with nephrolithiasis, no back pain red flags.  No significant abdominal, adnexal, or cervical motion tenderness to palpation, not consistent with PID.  Wet prep without BV, trichomoniasis, or yeast.  Labs without leukocytosis, anemia, or significant electrolyte derangement.  Creatinine mildly increased from prior, PCP recheck.  Pregnancy test is negative.  Urinalysis consistent with UTI.  Given right-sided back pain will cover for pyelonephritis, does not appear toxic or septic, does not appear to require admission at this time. Tx w/ keflex outpatient. I discussed results, treatment plan, need for follow-up, and return precautions with the patient. Provided opportunity for questions, patient confirmed understanding and is in agreement with plan.    Final Clinical Impressions(s) / ED Diagnoses   Final diagnoses:  Pyelonephritis    ED Discharge Orders         Ordered    cephALEXin (KEFLEX) 500 MG capsule  4 times daily     10/13/19 [address]1313           Veleka Djordjevic, OaksSamantha R, PA-C 10/13/19 1314    Milagros Lollykstra, Richard S, MD 10/14/19 [phone]

## 2019-10-13 NOTE — Discharge Instructions (Signed)
You were seen in the emergency department today for urinary symptoms and back pain.  We suspect that you have pyelonephritis, urinary tract/kidney infection.  We are sending you home with Keflex, antibiotic, please take this as prescribed.  This antibiotic is to treat the infection.  Please take all of your antibiotics until finished. You may develop abdominal discomfort or diarrhea from the antibiotic.  You may help offset this with probiotics which you can buy at the store (ask your pharmacist if unable to find) or get probiotics in the form of eating yogurt. Do not eat or take the probiotics until 2 hours after your antibiotic. If you are unable to tolerate these side effects follow-up with your primary care provider or return to the emergency department.   If you begin to experience any blistering, rashes, swelling, or difficulty breathing seek medical care for evaluation of potentially more serious side effects.   Please be aware that this medication may interact with other medications you are taking, please be sure to discuss your medication list with your pharmacist. If you are taking birth control the antibiotic will deactivate your birth control for 2 weeks.    We tested you for gonorrhea, chlamydia, HIV, and syphilis, if any of these tests are positive we will call you and let you know, if positive you will need to inform all sexual partners and seek treatment.  Your kidney function was very mildly elevated, please have this rechecked by your primary care provider within 1 week.  Follow with your primary care provider within 1 week.  Return to the ER for new or worsening symptoms including but not limited to worsening pain, fever, vomiting, or any other concerns.

## 2019-10-13 NOTE — ED Triage Notes (Signed)
Pain with urination for a week; concern for UTI.

## 2019-10-14 LAB — GC/CHLAMYDIA PROBE AMP (~~LOC~~) NOT AT ARMC
Chlamydia: NEGATIVE
Neisseria Gonorrhea: NEGATIVE

## 2019-10-14 LAB — RPR: RPR Ser Ql: NONREACTIVE

## 2019-10-15 LAB — URINE CULTURE: Culture: >=100000 — AB

## 2019-10-16 ENCOUNTER — Telehealth: Payer: Self-pay

## 2019-10-16 NOTE — Telephone Encounter (Signed)
Post ED Visit - Positive Culture Follow-up  Culture report reviewed by antimicrobial stewardship pharmacist: [place] Team []  Elenor Quinones, Pharm.D. []  Heide Guile, Pharm.D., BCPS AQ-ID []  Parks Neptune, Pharm.D., BCPS []  Alycia Rossetti, Pharm.D., BCPS []  [place], Pharm.D., BCPS, AAHIVP []  Legrand Como, Pharm.D., BCPS, AAHIVP []  Salome Arnt, PharmD, BCPS []  Johnnette Gourd, PharmD, BCPS []  Hughes Better, PharmD, BCPS []  Leeroy Cha, PharmD []  Laqueta Linden, PharmD, BCPS []  Albertina Parr, PharmD  [place] Team []  Leodis Sias, PharmD []  Lindell Spar, PharmD []  Royetta Asal, PharmD []  Graylin Shiver, Rph []  Rema Fendt) Glennon Mac, PharmD []  Arlyn Dunning, PharmD []  Netta Cedars, PharmD []  Dia Sitter, PharmD []  Leone Haven, PharmD []  Gretta Arab, PharmD []  Theodis Shove, PharmD []  Peggyann Juba, PharmD [x]  Reuel Boom, PharmD   Positive urine culture Treated with Cephalexin, organism sensitive to the same and no further patient follow-up is required at this time.  Genia Del 10/16/2019, 10:45 AM

## 2019-10-20 ENCOUNTER — Ambulatory Visit: Payer: Medicaid Other | Admitting: Family Medicine

## 2019-10-22 ENCOUNTER — Ambulatory Visit: Payer: Medicaid Other

## 2019-10-22 ENCOUNTER — Other Ambulatory Visit: Payer: Self-pay

## 2019-10-22 ENCOUNTER — Encounter: Payer: Self-pay | Admitting: Gastroenterology

## 2019-10-22 ENCOUNTER — Ambulatory Visit (INDEPENDENT_AMBULATORY_CARE_PROVIDER_SITE_OTHER): Payer: Medicaid Other | Admitting: Gastroenterology

## 2019-10-22 VITALS — BP 110/60 | HR 84 | Temp 97.2°F | Ht 59.0 in | Wt 151.0 lb

## 2019-10-22 DIAGNOSIS — K219 Gastro-esophageal reflux disease without esophagitis: Secondary | ICD-10-CM

## 2019-10-22 NOTE — Progress Notes (Signed)
HPI: This is a very pleasant 33 year old woman who was referred to me by Donnamae Jude, MD  to evaluate chronic GERD.    Chief complaint is chronic GERD  For least 5 or 6 years she has had issues with heartburn, pyrosis.  Over time she eventually started taking proton pump inhibitor omeprazole 40 mg about an hour before breakfast every morning.  She says as long she takes that she feels 90 to 95% better.  She is still occasionally bothered by overnight acid issues with burning, waterbrash.  She does not have dysphagia, she has not had any unintentional weight loss and in fact she has gained 30 or 40 pounds in the past 6 months.  Prior to gaining that weight she was able to skip 2 or 3 days in a row of her proton pump inhibitor without having significant acid resurgence.  Now however if she skips even 1 day she has significant burning in her chest acid taste in her mouth and even vomiting by mid afternoon.  No fevers or chills No dysphagia No overt GI bleeding No unintentional weight loss   Old Data Reviewed: Blood work October 2020 show CBC normal, negative for HIV, basic metabolic profile normal, E. coli positive urinary tract infection     Review of systems: Pertinent positive and negative review of systems were noted in the above HPI section. All other review negative.   Past Medical History:  Diagnosis Date  . Gestational diabetes   . Headache   . Hernia, abdominal 2011  . Supervision of other normal pregnancy, antepartum 05/17/2018    Nursing Staff Provider Office Location  [facility] Dating   Language  English Anatomy US  Normal female Flu Vaccine  Declined  Genetic Screen   NIPS: low risk  TDaP vaccine   08/27/18 Hgb A1C or  GTT Early  Third trimester: GDM  Rhogam  n/a   LAB RESULTS  Feeding Plan Breast Blood Type O/Positive/-- (05/31 1059) O pos Contraception nexplanon Antibody Negative (05/31 1059)neg Circumcision Female  Rub    Past Surgical History:  Procedure Laterality  Date  . CESAREAN SECTION    . DILATION AND EVACUATION Bilateral 07/14/2015   Procedure: DILATATION AND EVACUATION;  Surgeon: Frederico Hamman, MD;  Location: [facility];  Service: Gynecology;  Laterality: Bilateral;  . FOREARM FRACTURE SURGERY Right 2008  . WISDOM TOOTH EXTRACTION      Current Outpatient Medications  Medication Sig Dispense Refill  . cyclobenzaprine (FLEXERIL) 10 MG tablet Take 1 tablet (10 mg total) by mouth every 8 (eight) hours as needed for muscle spasms. 30 tablet 1  . ibuprofen (ADVIL) 600 MG tablet Take 1 tablet (600 mg total) by mouth every 6 (six) hours. 30 tablet 0  . meloxicam (MOBIC) 15 MG tablet Take 1 tablet (15 mg total) by mouth daily. 30 tablet 0  . omeprazole (PRILOSEC) 40 MG capsule Take 40 mg by mouth daily.     No current facility-administered medications for this visit.     Allergies as of 10/22/2019  . (No Known Allergies)    Family History  Problem Relation Age of Onset  . Hypertension Mother   . Hypertension Maternal Grandmother   . Hypertension Paternal Grandmother   . Hypertension Father   . Colon cancer Neg Hx   . Esophageal cancer Neg Hx   . Rectal cancer Neg Hx     Social History   Socioeconomic History  . Marital status: Single    Spouse name:  Not on file  . Number of children: 7  . Years of education: Not on file  . Highest education level: Not on file  Occupational History  . Occupation: [employer]  . Financial resource strain: Not hard at all  . Food insecurity    Worry: Never true    Inability: Never true  . Transportation needs    Medical: No    Non-medical: Not on file  Tobacco Use  . Smoking status: Current Some Day Smoker    Types: Cigarettes  . Smokeless tobacco: Never Used  . Tobacco comment: 2-3 on the days she does smoke  Substance and Sexual Activity  . Alcohol use: No  . Drug use: No  . Sexual activity: Yes    Birth control/protection: Implant  Lifestyle  . Physical activity    Days  per week: Not on file    Minutes per session: Not on file  . Stress: Only a little  Relationships  . Social Musician on phone: Not on file    Gets together: Not on file    Attends religious service: Not on file    Active member of club or organization: Not on file    Attends meetings of clubs or organizations: Not on file    Relationship status: Not on file  . Intimate partner violence    Fear of current or ex partner: No    Emotionally abused: No    Physically abused: No    Forced sexual activity: No  Other Topics Concern  . Not on file  Social History Narrative  . Not on file     Physical Exam: BP 110/60   Pulse 84   Temp (!) 97.2 F (36.2 C)   Ht 4\' 11"  (1.499 m)   Wt 151 lb (68.5 kg)   BMI 30.50 kg/m  Constitutional: generally well-appearing Psychiatric: alert and oriented x3 Eyes: extraocular movements intact Mouth: oral pharynx moist, no lesions Neck: supple no lymphadenopathy Cardiovascular: heart regular rate and rhythm Lungs: clear to auscultation bilaterally Abdomen: soft, nontender, nondistended, no obvious ascites, no peritoneal signs, normal bowel sounds Extremities: no lower extremity edema bilaterally Skin: no lesions on visible extremities   Assessment and plan: 33 y.o. female with chronic GERD without alarm symptoms  She has pretty classic GERD  without alarm symptoms.  I recommended she change the way she is taking her proton pump inhibitor so that it is 20-30 meds prior to her breakfast meal rather than an hour or so before.  I also recommended that she start taking a Pepcid H2 blocker 20 mg every night at bedtime for extra acid suppression overnight since she sometimes has breakthrough at that time.  She will call to report on her response in 6 to 7 weeks.  She has no alarm symptoms and I do not think she needs endoscopic testing at this point.    Please see the "Patient Instructions" section for addition details about the  plan.   32, MD [facility] 10/22/2019, 2:07 PM  Cc: 13/03/2019, MD

## 2019-10-22 NOTE — Patient Instructions (Signed)
Please continue your omeprazole. Take it 20 to 30 minutes before breakfast.  Start over the counter Pepcid 20mg  , take one daily at bedtime.   Call us in 4-6 weeks with an update on how your doing.   Thank you for coming in today. Owens Loffler, MD

## 2019-10-23 ENCOUNTER — Other Ambulatory Visit: Payer: Self-pay

## 2019-10-23 ENCOUNTER — Ambulatory Visit: Payer: Medicaid Other | Admitting: Student in an Organized Health Care Education/Training Program

## 2019-10-23 DIAGNOSIS — Z3046 Encounter for surveillance of implantable subdermal contraceptive: Secondary | ICD-10-CM | POA: Diagnosis not present

## 2019-10-23 DIAGNOSIS — K219 Gastro-esophageal reflux disease without esophagitis: Secondary | ICD-10-CM | POA: Diagnosis not present

## 2019-10-23 HISTORY — DX: Encounter for surveillance of implantable subdermal contraceptive: Z30.46

## 2019-10-23 MED ORDER — NORGESTIM-ETH ESTRAD TRIPHASIC 0.18/0.215/0.25 MG-25 MCG PO TABS: 1.0000 | ORAL_TABLET | Freq: Every day | ORAL | 11 refills | Status: DC

## 2019-10-23 MED ORDER — OMEPRAZOLE 40 MG PO CPDR: 40.0000 mg | DELAYED_RELEASE_CAPSULE | Freq: Every day | ORAL | 1 refills | Status: DC

## 2019-10-23 NOTE — Patient Instructions (Signed)
It was a pleasure to see you today!  To summarize our discussion for this visit:  We have removed her Nexplanon today which went without any complications.  If you experience prolonged bleeding, redness, pain, fever please let us know immediately.  I am prescribing you a low estrogen pill.  Please be aware that smoking while on birth control can increase your risk of blood clots so I do recommend that you decrease and stop smoking if possible.  I refilled your omeprazole.  Call the clinic at [phone] if your symptoms worsen or you have any concerns.   Thank you for allowing me to take part in your care,  Dr. Jamelle Rushing  Steps to Quit Smoking Smoking tobacco is the leading cause of preventable death. It can affect almost every organ in the body. Smoking puts you and those around you at risk for developing many serious chronic diseases. Quitting smoking can be difficult, but it is one of the best things that you can do for your health. It is never too late to quit. How do I get ready to quit? When you decide to quit smoking, create a plan to help you succeed. Before you quit:  Pick a date to quit. Set a date within the next 2 weeks to give you time to prepare.  Write down the reasons why you are quitting. Keep this list in places where you will see it often.  Tell your family, friends, and co-workers that you are quitting. Support from your loved ones can make quitting easier.  Talk with your health care provider about your options for quitting smoking.  Find out what treatment options are covered by your health insurance.  Identify people, places, things, and activities that make you want to smoke (triggers). Avoid them. What first steps can I take to quit smoking?  Throw away all cigarettes at home, at work, and in your car.  Throw away smoking accessories, such as Set designer.  Clean your car. Make sure to empty the ashtray.  Clean your home, including  curtains and carpets. What strategies can I use to quit smoking? Talk with your health care provider about combining strategies, such as taking medicines while you are also receiving in-person counseling. Using these two strategies together makes you more likely to succeed in quitting than if you used either strategy on its own.  If you are pregnant or breastfeeding, talk with your health care provider about finding counseling or other support strategies to quit smoking. Do not take medicine to help you quit smoking unless your health care provider tells you to do so. To quit smoking: Quit right away  Quit smoking completely, instead of gradually reducing how much you smoke over a period of time. Research shows that stopping smoking right away is more successful than gradually quitting.  Attend in-person counseling to help you build problem-solving skills. You are more likely to succeed in quitting if you attend counseling sessions regularly. Even short sessions of 10 minutes can be effective. Take medicine You may take medicines to help you quit smoking. Some medicines require a prescription and some you can purchase over-the-counter. Medicines may have nicotine in them to replace the nicotine in cigarettes. Medicines may:  Help to stop cravings.  Help to relieve withdrawal symptoms. Your health care provider may recommend:  Nicotine patches, gum, or lozenges.  Nicotine inhalers or sprays.  Non-nicotine medicine that is taken by mouth. Find resources Find resources and support systems that can help  you to quit smoking and remain smoke-free after you quit. These resources are most helpful when you use them often. They include:  Online chats with a Social worker.  Telephone quitlines.  Printed Furniture conservator/restorer.  Support groups or group counseling.  Text messaging programs.  Mobile phone apps or applications. Use apps that can help you stick to your quit plan by providing reminders,  tips, and encouragement. There are many free apps for mobile devices as well as websites. Examples include Quit Guide from the [employer] and smokefree.gov What things can I do to make it easier to quit?   Reach out to your family and friends for support and encouragement. Call telephone quitlines (1-800-QUIT-NOW), reach out to support groups, or work with a counselor for support.  Ask people who smoke to avoid smoking around you.  Avoid places that trigger you to smoke, such as bars, parties, or smoke-break areas at work.  Spend time with people who do not smoke.  Lessen the stress in your life. Stress can be a smoking trigger for some people. To lessen stress, try: ? Exercising regularly. ? Doing deep-breathing exercises. ? Doing yoga. ? Meditating. ? Performing a body scan. This involves closing your eyes, scanning your body from head to toe, and noticing which parts of your body are particularly tense. Try to relax the muscles in those areas. How will I feel when I quit smoking? Day 1 to 3 weeks Within the first 24 hours of quitting smoking, you may start to feel withdrawal symptoms. These symptoms are usually most noticeable 2-3 days after quitting, but they usually do not last for more than 2-3 weeks. You may experience these symptoms:  Mood swings.  Restlessness, anxiety, or irritability.  Trouble concentrating.  Dizziness.  Strong cravings for sugary foods and nicotine.  Mild weight gain.  Constipation.  Nausea.  Coughing or a sore throat.  Changes in how the medicines that you take for unrelated issues work in your body.  Depression.  Trouble sleeping (insomnia). Week 3 and afterward After the first 2-3 weeks of quitting, you may start to notice more positive results, such as:  Improved sense of smell and taste.  Decreased coughing and sore throat.  Slower heart rate.  Lower blood pressure.  Clearer skin.  The ability to breathe more easily.  Fewer sick  days. Quitting smoking can be very challenging. Do not get discouraged if you are not successful the first time. Some people need to make many attempts to quit before they achieve long-term success. Do your best to stick to your quit plan, and talk with your health care provider if you have any questions or concerns. Summary  Smoking tobacco is the leading cause of preventable death. Quitting smoking is one of the best things that you can do for your health.  When you decide to quit smoking, create a plan to help you succeed.  Quit smoking right away, not slowly over a period of time.  When you start quitting, seek help from your health care provider, family, or friends. This information is not intended to replace advice given to you by your health care provider. Make sure you discuss any questions you have with your health care provider. Document Released: 11/28/2001 Document Revised: 02/21/2019 Document Reviewed: 02/22/2019 Elsevier Patient Education  2020 [employer].

## 2019-10-23 NOTE — Assessment & Plan Note (Addendum)
Removed nexplanon due to patient intolerance. Nexplanon Removal Procedure Note  PRE-OP DIAGNOSIS: Nexplanon, desire for change of contraception   POST-OP DIAGNOSIS: Same   Allergies were reviewed prior to procedure and patient denied any allergies to lidocaine or latex.  Risks and benefits of removal were discussed prior to procedure to include bleeding and infections risks at incision site. preventative precautions were also covered.   PROCEDURE: Nexplanon Removal  Performing Physician: Doristine Mango, DO PROCEDURE:  Anesthesia: 2% Lidocaine w/ epinephrine 3 ml  Procedure: Consent obtained. A time-out was performed prior to initiating procedure to be sure of right patient and right location. The area surrounding the Nexplanon was prepared with Betadine and draped in the usual sterile manner. The site was anesthetized with lidocaine. A skin incision was made over the distal aspect of the device. The capsule lysed sharply and the device removed using a hemostat. Hemostasis was assured. The site was dressed with SteriStrips and a pressure dressing. The patient tolerated the procedure well.   Followup: The patient tolerated the procedure well without complications. Standard post-procedure care is explained and return precautions are given. Contraception is advised until conception is desired.  Patient was provided with low estrogen OCP per her preference to start today and is at increased risk for history of smoking and obesity.

## 2019-10-23 NOTE — Progress Notes (Signed)
   Subjective:    Patient ID: Leah Walls, female    DOB: [birth year], 33 y.o.   MRN: [id number]  CC: Nexplanon removal  HPI:  Has been present for approximately a year.  States that she has constant itching at the site of the implant and does not wish to have new implant.  She would like to use OCPs at this time.  Patient is a smoker of 5 cigarettes/day.  No history of blood clots or clotting disorders in family.  Requesting refill of omeprazole.  Smoking status reviewed   ROS: pertinent noted in the HPI   I have personally reviewed pertinent past medical history, surgical, family, and social history as appropriate. Objective:  BP 108/62   Pulse (!) 112   Wt 151 lb 12.8 oz (68.9 kg)   SpO2 97%   BMI 30.66 kg/m   Vitals and nursing note reviewed  General: NAD, pleasant, able to participate in exam Extremities: no edema or cyanosis. Skin: warm and dry, no rashes noted.  Nexplanon palpated on right medial bicep proximal to elbow post removal showed surgical incision approximately 3 mm length with good hemostasis. Neuro: alert, no obvious focal deficits Psych: Normal affect and mood  Assessment & Plan:   Nexplanon removal Removed nexplanon due to patient intolerance. Nexplanon Removal Procedure Note  PRE-OP DIAGNOSIS: Nexplanon, desire for change of contraception   POST-OP DIAGNOSIS: Same   Allergies were reviewed prior to procedure and patient denied any allergies to lidocaine or latex.  Risks and benefits of removal were discussed prior to procedure to include bleeding and infections risks at incision site. preventative precautions were also covered.   PROCEDURE: Nexplanon Removal  Performing Physician: Doristine Mango, DO PROCEDURE:  Anesthesia: 2% Lidocaine w/ epinephrine 3 ml  Procedure: Consent obtained. A time-out was performed prior to initiating procedure to be sure of right patient and right location. The area surrounding the Nexplanon was prepared with  Betadine and draped in the usual sterile manner. The site was anesthetized with lidocaine. A skin incision was made over the distal aspect of the device. The capsule lysed sharply and the device removed using a hemostat. Hemostasis was assured. The site was dressed with SteriStrips and a pressure dressing. The patient tolerated the procedure well.   Followup: The patient tolerated the procedure well without complications. Standard post-procedure care is explained and return precautions are given. Contraception is advised until conception is desired.  Patient was provided with low estrogen OCP per her preference to start today and is at increased risk for history of smoking and obesity.  Gastroesophageal reflux disease without esophagitis Refilled omeprazole  Meds ordered this encounter  Medications  . omeprazole (PRILOSEC) 40 MG capsule    Sig: Take 1 capsule (40 mg total) by mouth daily.    Dispense:  90 capsule    Refill:  1  . Norgestimate-Ethinyl Estradiol Triphasic (ORTHO TRI-CYCLEN LO) 0.18/0.215/0.25 MG-25 MCG tab    Sig: Take 1 tablet by mouth daily.    Dispense:  1 Package    Refill:  [facility] PGY-2

## 2019-10-23 NOTE — Assessment & Plan Note (Signed)
Refilled omeprazole.

## 2019-10-28 ENCOUNTER — Ambulatory Visit: Payer: Medicaid Other | Admitting: Sports Medicine

## 2019-10-28 ENCOUNTER — Encounter: Payer: Self-pay | Admitting: Sports Medicine

## 2019-10-28 ENCOUNTER — Other Ambulatory Visit: Payer: Self-pay

## 2019-10-28 DIAGNOSIS — M79672 Pain in left foot: Secondary | ICD-10-CM

## 2019-10-28 DIAGNOSIS — M79671 Pain in right foot: Secondary | ICD-10-CM

## 2019-10-28 DIAGNOSIS — M2141 Flat foot [pes planus] (acquired), right foot: Secondary | ICD-10-CM | POA: Diagnosis not present

## 2019-10-28 DIAGNOSIS — M722 Plantar fascial fibromatosis: Secondary | ICD-10-CM

## 2019-10-28 DIAGNOSIS — M2142 Flat foot [pes planus] (acquired), left foot: Secondary | ICD-10-CM | POA: Diagnosis not present

## 2019-10-28 MED ORDER — TRIAMCINOLONE ACETONIDE 10 MG/ML IJ SUSP
10.0000 mg | Freq: Once | INTRAMUSCULAR | Status: AC
  Administered 2019-10-28: 10 mg

## 2019-10-28 NOTE — Patient Instructions (Signed)
For Orthotics Pilgrim's Pride Can be purchased at [employer] or [employer]  For tennis shoes recommend:  Bourbon Can be purchased at [employer] or [employer]  Vionic  SAS Can be purchased at [employer] or [employer]   For work shoes recommend: [employer] Work [employer]  Can be purchased at a variety of places or Engineer, maintenance (IT)   For casual shoes recommend: Vionic  Can be purchased at [employer] or [employer]

## 2019-10-28 NOTE — Progress Notes (Signed)
Subjective: Leah Walls is a 33 y.o. female returns to office for follow up evaluation bilateral foot pain; pain is now 7/10 and has  decreased in frequency to the area. Patient denies any recent changes in medications or new problems since last visit.  Patient reports that the medications have helped some with the pain completed steroid and continuing to take her meloxicam without any issues.  Patient reports that pain is still present in both heels and bottoms of feet very difficult to walk or stand a long period of time due to the pain however does notice a difference with the oral medication.  Patient Active Problem List   Diagnosis Date Noted  . Nexplanon removal 10/23/2019  . Gastroesophageal reflux disease without esophagitis 09/19/2019  . History of gestational diabetes 08/29/2018  . Umbilical hernia 52/77/8242  . Genital herpes 10/18/2016  . Marijuana use 07/30/2016  . Breast lump on right side at 1 o'clock position 07/24/2016    Current Outpatient Medications on File Prior to Visit  Medication Sig Dispense Refill  . cyclobenzaprine (FLEXERIL) 10 MG tablet Take 1 tablet (10 mg total) by mouth every 8 (eight) hours as needed for muscle spasms. 30 tablet 1  . famotidine (PEPCID) 20 MG tablet Take 20 mg by mouth at bedtime.    Marland Kitchen ibuprofen (ADVIL) 600 MG tablet Take 1 tablet (600 mg total) by mouth every 6 (six) hours. 30 tablet 0  . meloxicam (MOBIC) 15 MG tablet Take 1 tablet (15 mg total) by mouth daily. 30 tablet 0  . Norgestimate-Ethinyl Estradiol Triphasic (ORTHO TRI-CYCLEN LO) 0.18/0.215/0.25 MG-25 MCG tab Take 1 tablet by mouth daily. 1 Package 11  . omeprazole (PRILOSEC) 40 MG capsule Take 1 capsule (40 mg total) by mouth daily. 90 capsule 1  . [DISCONTINUED] amLODipine (NORVASC) 5 MG tablet Take 1 tablet (5 mg total) by mouth daily. 30 tablet 0  . [DISCONTINUED] promethazine (PHENERGAN) 25 MG tablet Take 1 tablet (25 mg total) by mouth every 6 (six) hours as needed for  nausea or vomiting. (Patient not taking: Reported on 12/05/2018) 30 tablet 0   No current facility-administered medications on file prior to visit.     No Known Allergies  Objective:   General:  Alert and oriented x 3, in no acute distress  Dermatology: Skin is warm, dry, and supple bilateral. Nails are within normal limits. There is no lower extremity erythema, no eccymosis, no open lesions present bilateral.   Vascular: Dorsalis Pedis and Posterior Tibial pedal pulses are 2/4 bilateral. + hair growth noted bilateral. Capillary Fill Time is 3 seconds in all digits. No varicosities, No edema bilateral lower extremities.   Neurological: Sensation grossly intact to light touch with an achilles reflex of +2 and a negative Tinel's sign bilateral. Vibratory, sharp/dull, Semmes Weinstein Monofilament within normal limits.   Musculoskeletal: There is decreased tenderness to palpation at the medial calcaneal tubercale and through the insertion of the plantar fascia on the Left and right foot. No pain with compression to calcaneus or application of tuning fork. There is decreased Ankle joint range of motion bilateral. All other jointsrange of motion  within normal limits bilateral. Strength 5/5 bilateral.   Assessment and Plan: Problem List Items Addressed This Visit    None    Visit Diagnoses    Bilateral plantar fasciitis    -  Primary   Bilateral pes planus       Pain in both feet          -  Complete examination performed.  -Previous x-rays reviewed. -Discussed with patient in detail the condition of plantar fasciitis, how this occurs related to the foot type of the patient and general treatment options. - Patient opted for injection #1 today in both heels; After oral consent and aseptic prep, injected a mixture containing 1 ml of 1%plain lidocaine, 1 ml 0.5% plain marcaine, 0.5 ml of kenalog 10 and 0.5 ml of dexmethasone phosphate to left and right heel at area of most pain/trigger point  injection. -Apply plantar fascial taping bilateral to keep intact for 5 days if works well may benefit from getting super feet orthotics over-the-counter -Continue with stretching, icing, good supportive shoes, inserts daily.  -Discussed long term care and reocurrence; will closely monitor; if fails to improve will consider other treatment modalities.  -Patient to return to office in 1 month for follow up or sooner if problems or questions arise.  Asencion Islam, DPM

## 2019-11-25 ENCOUNTER — Ambulatory Visit: Payer: Medicaid Other | Admitting: Sports Medicine

## 2020-05-21 ENCOUNTER — Other Ambulatory Visit: Payer: Self-pay

## 2020-05-21 ENCOUNTER — Emergency Department (HOSPITAL_COMMUNITY)
Admission: EM | Admit: 2020-05-21 | Discharge: 2020-05-22 | Discharge status: Against Medical Advice | Payer: Medicaid Other

## 2020-05-21 NOTE — ED Notes (Signed)
Pt called 3x to triage. Did not respond.

## 2020-05-21 NOTE — ED Notes (Signed)
Unable to locate in waiting room

## 2020-05-23 ENCOUNTER — Encounter (HOSPITAL_COMMUNITY): Payer: Self-pay | Admitting: Emergency Medicine

## 2020-05-23 ENCOUNTER — Emergency Department (HOSPITAL_COMMUNITY)
Admission: EM | Admit: 2020-05-23 | Discharge: 2020-05-23 | Disposition: A | Discharge status: Home / Self Care | Payer: Medicaid Other | Attending: Emergency Medicine | Admitting: Emergency Medicine

## 2020-05-23 ENCOUNTER — Other Ambulatory Visit: Payer: Self-pay

## 2020-05-23 DIAGNOSIS — Z3202 Encounter for pregnancy test, result negative: Secondary | ICD-10-CM | POA: Insufficient documentation

## 2020-05-23 DIAGNOSIS — Z789 Other specified health status: Secondary | ICD-10-CM

## 2020-05-23 DIAGNOSIS — Z79899 Other long term (current) drug therapy: Secondary | ICD-10-CM | POA: Diagnosis not present

## 2020-05-23 DIAGNOSIS — F17210 Nicotine dependence, cigarettes, uncomplicated: Secondary | ICD-10-CM | POA: Insufficient documentation

## 2020-05-23 LAB — I-STAT BETA HCG BLOOD, ED (MC, WL, AP ONLY): I-stat hCG, quantitative: <5 m[IU]/mL (ref <5)

## 2020-05-23 LAB — POC URINE PREG, ED: Preg Test, Ur: NEGATIVE

## 2020-05-23 NOTE — ED Provider Notes (Signed)
[facility]-EMERGENCY DEPT Provider Note   CSN: [id number] Arrival date & time: 05/23/20  1453     History Chief Complaint  Patient presents with  . Possible Pregnancy    Leah Walls is a 34 y.o. female who presents to the ER for evaluation of pregnancy.  She did not have a period last month.  She states that she is taken 3 urinary pregnancy tests at home, 1 of which was positive to which were negative.  She states in the past she was pregnant for 2-1/2 months and kept having negative pregnancy test.  She is requesting a blood draw.  HPI     Past Medical History:  Diagnosis Date  . Gestational diabetes   . Headache   . Hernia, abdominal 2011  . Supervision of other normal pregnancy, antepartum 05/17/2018    Nursing Staff Provider Office Location  [facility] Dating   Language  English Anatomy US  Normal female Flu Vaccine  Declined  Genetic Screen   NIPS: low risk  TDaP vaccine   08/27/18 Hgb A1C or  GTT Early  Third trimester: GDM  Rhogam  n/a   LAB RESULTS  Feeding Plan Breast Blood Type O/Positive/-- (05/31 1059) O pos Contraception nexplanon Antibody Negative (05/31 1059)neg Circumcision Female  Rub    Patient Active Problem List   Diagnosis Date Noted  . Nexplanon removal 10/23/2019  . Gastroesophageal reflux disease without esophagitis 09/19/2019  . History of gestational diabetes 08/29/2018  . Umbilical hernia 11/15/2016  . Genital herpes 10/18/2016  . Marijuana use 07/30/2016  . Breast lump on right side at 1 o'clock position 07/24/2016    Past Surgical History:  Procedure Laterality Date  . CESAREAN SECTION    . DILATION AND EVACUATION Bilateral 07/14/2015   Procedure: DILATATION AND EVACUATION;  Surgeon: Kathreen Cosier, MD;  Location: [facility];  Service: Gynecology;  Laterality: Bilateral;  . FOREARM FRACTURE SURGERY Right 2008  . WISDOM TOOTH EXTRACTION       OB History    Gravida  7   Para  7   Term  6   Preterm  1   AB  0   Living  7     SAB  0   TAB  0   Ectopic  0   Multiple  0   Live Births  5           Family History  Problem Relation Age of Onset  . Hypertension Mother   . Hypertension Maternal Grandmother   . Hypertension Paternal Grandmother   . Hypertension Father   . Colon cancer Neg Hx   . Esophageal cancer Neg Hx   . Rectal cancer Neg Hx     Social History   Tobacco Use  . Smoking status: Current Some Day Smoker    Types: Cigarettes  . Smokeless tobacco: Never Used  . Tobacco comment: 2-3 on the days she does smoke  Substance Use Topics  . Alcohol use: No  . Drug use: No    Home Medications Prior to Admission medications   Medication Sig Start Date End Date Taking? Authorizing Provider  cyclobenzaprine (FLEXERIL) 10 MG tablet Take 1 tablet (10 mg total) by mouth every 8 (eight) hours as needed for muscle spasms. 09/19/19   Reva Bores, MD  famotidine (PEPCID) 20 MG tablet Take 20 mg by mouth at bedtime.    [provider]  ibuprofen (ADVIL) 600 MG tablet Take 1 tablet (600 mg total)  by mouth every 6 (six) hours. 09/19/19   Donnamae Jude, MD  meloxicam (MOBIC) 15 MG tablet Take 1 tablet (15 mg total) by mouth daily. 09/30/19   Landis Martins, DPM  Norgestimate-Ethinyl Estradiol Triphasic (ORTHO TRI-CYCLEN LO) 0.18/0.215/0.25 MG-25 MCG tab Take 1 tablet by mouth daily. 10/23/19   Anderson, Chelsey L, DO  omeprazole (PRILOSEC) 40 MG capsule Take 1 capsule (40 mg total) by mouth daily. 10/23/19   Anderson, Chelsey L, DO  amLODipine (NORVASC) 5 MG tablet Take 1 tablet (5 mg total) by mouth daily. 12/05/18 08/22/19  Rasch, Anderson Malta I, NP  promethazine (PHENERGAN) 25 MG tablet Take 1 tablet (25 mg total) by mouth every 6 (six) hours as needed for nausea or vomiting. Patient not taking: Reported on 12/05/2018 10/25/18 08/22/19  Osborne Oman, MD    Allergies    Patient has no known allergies.  Review of Systems   Review of Systems  Constitutional: Negative for  chills and fever.  Genitourinary: Positive for menstrual problem. Negative for dysuria and hematuria.    Physical Exam Updated Vital Signs BP 124/82 (BP Location: Left Arm)   Pulse (!) 105   Temp 98.3 F (36.8 C) (Oral)   Resp 16   SpO2 100%   Physical Exam Vitals and nursing note reviewed.  Constitutional:      General: She is not in acute distress.    Appearance: She is well-developed. She is not diaphoretic.  HENT:     Head: Normocephalic and atraumatic.  Eyes:     General: No scleral icterus.    Conjunctiva/sclera: Conjunctivae normal.  Cardiovascular:     Rate and Rhythm: Normal rate and regular rhythm.     Heart sounds: Normal heart sounds. No murmur. No friction rub. No gallop.   Pulmonary:     Effort: Pulmonary effort is normal. No respiratory distress.     Breath sounds: Normal breath sounds.  Abdominal:     General: Bowel sounds are normal. There is no distension.     Palpations: Abdomen is soft. There is no mass.     Tenderness: There is no abdominal tenderness. There is no guarding.  Musculoskeletal:     Cervical back: Normal range of motion.  Skin:    General: Skin is warm and dry.  Neurological:     Mental Status: She is alert and oriented to person, place, and time.  Psychiatric:        Behavior: Behavior normal.     ED Results / Procedures / Treatments   Labs (all labs ordered are listed, but only abnormal results are displayed) Labs Reviewed  POC URINE PREG, ED  I-STAT BETA HCG BLOOD, ED ([facility], [facility], [facility] ONLY)    EKG None  Radiology No results found.  Procedures Procedures (including critical care time)  Medications Ordered in ED Medications - No data to display  ED Course  I have reviewed the triage vital signs and the nursing notes.  Pertinent labs & imaging results that were available during my care of the patient were reviewed by me and considered in my medical decision making (see chart for details).    MDM Rules/Calculators/A&P                        Final Clinical Impression(s) / ED Diagnoses Final diagnoses:  Not currently pregnant    Rx / DC Orders ED Discharge Orders    None       Margarita Mail, PA-C 05/24/20  2318    Pollyann Savoy, MD 05/25/20 [phone]

## 2020-05-23 NOTE — ED Triage Notes (Addendum)
Per pt, states she is here for a "blood" pregnancy test-states she has taken home pregnancy tests, some came back negative but one came back positive-last menstrual cycle 04/17/2020

## 2020-05-23 NOTE — Discharge Instructions (Signed)
Please return for any new or worsening symptoms.

## 2020-05-31 ENCOUNTER — Encounter: Payer: Self-pay | Admitting: Family Medicine

## 2020-06-01 ENCOUNTER — Ambulatory Visit (INDEPENDENT_AMBULATORY_CARE_PROVIDER_SITE_OTHER): Payer: Medicaid Other | Admitting: Family Medicine

## 2020-06-01 ENCOUNTER — Other Ambulatory Visit: Payer: Self-pay | Admitting: Family Medicine

## 2020-06-01 ENCOUNTER — Other Ambulatory Visit: Payer: Self-pay

## 2020-06-01 ENCOUNTER — Encounter: Payer: Self-pay | Admitting: Family Medicine

## 2020-06-01 ENCOUNTER — Other Ambulatory Visit (HOSPITAL_COMMUNITY)
Admission: RE | Admit: 2020-06-01 | Discharge: 2020-06-01 | Disposition: A | Discharge status: Home / Self Care | Payer: Medicaid Other | Source: Ambulatory Visit | Attending: Family Medicine | Admitting: Family Medicine

## 2020-06-01 VITALS — BP 118/82 | HR 92 | Ht 59.0 in | Wt 139.0 lb

## 2020-06-01 DIAGNOSIS — N926 Irregular menstruation, unspecified: Secondary | ICD-10-CM

## 2020-06-01 DIAGNOSIS — Z124 Encounter for screening for malignant neoplasm of cervix: Secondary | ICD-10-CM | POA: Insufficient documentation

## 2020-06-01 DIAGNOSIS — Z202 Contact with and (suspected) exposure to infections with a predominantly sexual mode of transmission: Secondary | ICD-10-CM

## 2020-06-01 DIAGNOSIS — Z3041 Encounter for surveillance of contraceptive pills: Secondary | ICD-10-CM | POA: Diagnosis not present

## 2020-06-01 DIAGNOSIS — M4802 Spinal stenosis, cervical region: Secondary | ICD-10-CM | POA: Diagnosis not present

## 2020-06-01 DIAGNOSIS — N63 Unspecified lump in unspecified breast: Secondary | ICD-10-CM

## 2020-06-01 DIAGNOSIS — Z1159 Encounter for screening for other viral diseases: Secondary | ICD-10-CM | POA: Diagnosis not present

## 2020-06-01 DIAGNOSIS — Z720 Tobacco use: Secondary | ICD-10-CM | POA: Diagnosis not present

## 2020-06-01 DIAGNOSIS — F17210 Nicotine dependence, cigarettes, uncomplicated: Secondary | ICD-10-CM

## 2020-06-01 DIAGNOSIS — Z3202 Encounter for pregnancy test, result negative: Secondary | ICD-10-CM

## 2020-06-01 DIAGNOSIS — R062 Wheezing: Secondary | ICD-10-CM | POA: Diagnosis not present

## 2020-06-01 DIAGNOSIS — N6312 Unspecified lump in the right breast, upper inner quadrant: Secondary | ICD-10-CM

## 2020-06-01 LAB — POCT WET PREP (WET MOUNT)
Clue Cells Wet Prep Whiff POC: NEGATIVE
Trichomonas Wet Prep HPF POC: ABSENT

## 2020-06-01 LAB — POCT URINE PREGNANCY: Preg Test, Ur: NEGATIVE

## 2020-06-01 MED ORDER — NORGESTIM-ETH ESTRAD TRIPHASIC 0.18/0.215/0.25 MG-25 MCG PO TABS: 1.0000 | ORAL_TABLET | Freq: Every day | ORAL | 11 refills | Status: DC

## 2020-06-01 MED ORDER — FLUCONAZOLE 150 MG PO TABS: 150.0000 mg | ORAL_TABLET | Freq: Once | ORAL | 0 refills | Status: DC

## 2020-06-01 MED ORDER — ALBUTEROL SULFATE HFA 108 (90 BASE) MCG/ACT IN AERS: 2.0000 | INHALATION_SPRAY | Freq: Four times a day (QID) | RESPIRATORY_TRACT | 2 refills | Status: DC | PRN

## 2020-06-01 NOTE — Assessment & Plan Note (Signed)
Previous record reviewed. + breast lump since 2017. Most recent US in 2019 was benign. Repeat US recommended. However, she did not f/u with it. Since it has increased in size, I will proceed with diagnostic mammogram and right breast U/S.

## 2020-06-01 NOTE — Assessment & Plan Note (Signed)
Currently asymptomatic. I discussed PFT to assess for asthma/COPD. Will refer in the future. Albuterol refilled.

## 2020-06-01 NOTE — Patient Instructions (Signed)

## 2020-06-01 NOTE — Progress Notes (Signed)
Subjective:   Gyn exam:  Leah Walls is a 34 y.o. female here for a routine exam.  Current complaints: Breast lump x 3 years. Gotten bigger. No pain, no nipple discharge. She had breast US in the past and was benign. Here for f/u.   Contraceptive management: LMP was 5/2/2 irregular with few spotting in June. She last took her OCP about a month ago. She tried Depo and Nexplanon in the past and didn't like how it made her feel. She will like to get back on her current OCP.  Smoking: Smoking for about 8 yrs now. She is down to 7 cigarettes daily. Trying to cut back more.  SOB: She was prescribed albuterol during her hospitalization for Bronchitis a while back. Since then she uses meds as needed for wheezing or SOB. She denies hx of asthma. She feels fine today.  Gynecologic History No LMP recorded. Patient has had an implant. 04/18/20 Contraception: OCP (estrogen/progesterone) Last Pap: 2017. Results were: normal Last mammogram: N/A. Results were: N/A  Obstetric History OB History  Gravida Para Term Preterm AB Living  7 7 6 1  0 7  SAB TAB Ectopic Multiple Live Births  0 0 0 0 5    # Outcome Date GA Lbr Len/2nd Weight Sex Delivery Anes PTL Lv  7 Term 10/29/18 [redacted]w[redacted]d 01:59 / 00:03 6 lb 11.9 oz (3.059 kg) F VBAC EPI  LIV     Birth Comments: wnl  6 Term 02/06/17 [redacted]w[redacted]d 08:23 / 00:05 7 lb 3 oz (3.26 kg) M VBAC EPI  LIV  5 Term 07/07/15 [redacted]w[redacted]d 18:20 / 00:20 6 lb 14.1 oz (3.12 kg) F VBAC EPI  LIV  4 Term 03/11/09    F Vag-Spont   LIV  3 Preterm 12/02/07 [redacted]w[redacted]d   M Vag-Spont   LIV  2 Term 04/08/05    M Vag-Spont     1 Term 02/01/03    M CS-LTranv   LIV     The following portions of the patient's history were reviewed and updated as appropriate: allergies, current medications, past family history, past medical history, past social history, past surgical history and problem list.  Review of Systems Pertinent items are noted in HPI.    Objective:    BP 118/82   Pulse 92   Ht 4\' 11"   (1.499 m)   Wt 139 lb (63 kg)   LMP 04/18/2020   SpO2 98%   BMI 28.07 kg/m  General appearance: alert and cooperative Lungs: clear to auscultation bilaterally Breasts: No axillary LN B/L. Left breast normal exam. +, round, firm, mobile mass about 3cm by 3cm on the inner upper quadrant of her right breast. No nipple discharge B/L. No dimpling, redness or breast tenderness B/L. Heart: regular rate and rhythm, S1, S2 normal, no murmur, click, rub or gallop Abdomen: soft, non-tender; bowel sounds normal; no masses,  no organomegaly Pelvic: cervix normal in appearance, external genitalia normal, no adnexal masses or tenderness, no cervical motion tenderness, rectovaginal septum normal, uterus normal size, shape, and consistency, vagina normal with small discharge and Chaperone: . Present for both breast and pelvic exam Extremities: extremities normal, atraumatic, no cyanosis or edema Neurologic: Grossly normal    Assessment/Plan:   Gyne exam: PAP completed. I will contact her with the result. Requested STD screen as well. HIV, RPR, GC/Chlam checked today. I will contact her with the result. + Vaginal discharge on exam. Wet prep + for yeast. Diflucan x one dose escribed.  Breast lump:  Previous record reviewed. + breast lump since 2017. Most recent US in 2019 was benign. Repeat US recommended. However, she did not f/u with it. Since it has increased in size, I will proceed with diagnostic mammogram and right breast U/S.  Smoking cessation: Counseling done. She prefers to quit cold [place]. I will follow up on this.   SOB/Wheezing: No diagnosis of Asthma Currently asymptomatic. I discussed PFT to assess for asthma/COPD. Will refer in the future. Albuterol refilled.  Contraceptive management: OCP (estrogen/progesterone) and Various other options discussed. Prefers OCP. Refills given.

## 2020-06-01 NOTE — Assessment & Plan Note (Signed)
Counseling done. She prefers to quit cold [place]. I will follow up on this.

## 2020-06-02 ENCOUNTER — Telehealth: Payer: Self-pay | Admitting: Family Medicine

## 2020-06-02 ENCOUNTER — Other Ambulatory Visit: Payer: Self-pay | Admitting: Family Medicine

## 2020-06-02 LAB — CERVICOVAGINAL ANCILLARY ONLY
Chlamydia: NEGATIVE
Comment: NEGATIVE
Comment: NORMAL
Neisseria Gonorrhea: NEGATIVE

## 2020-06-02 LAB — HIV ANTIBODY (ROUTINE TESTING W REFLEX): HIV Screen 4th Generation wRfx: NONREACTIVE

## 2020-06-02 LAB — HEPATITIS C ANTIBODY: Hep C Virus Ab: <0.1 s/co ratio (ref 0.0–0.9)

## 2020-06-02 LAB — RPR: RPR Ser Ql: NONREACTIVE

## 2020-06-02 NOTE — Telephone Encounter (Signed)
I called and discussed her test results.  Neg HIV/RPR. GC/Chlamydia pending.  She mentioned that sometimes her head shakes when she tries to focus or concentrate and wonder what could cause this.  ?? Essential tremors. She said her son who is 4 also has similar presentation on and off.  I encouraged her to f/u with me soon for reassessment. Appointment made.

## 2020-06-03 ENCOUNTER — Telehealth: Payer: Self-pay | Admitting: Family Medicine

## 2020-06-03 LAB — CYTOLOGY - PAP
Comment: NEGATIVE
Diagnosis: NEGATIVE
Diagnosis: REACTIVE
High risk HPV: NEGATIVE

## 2020-06-03 NOTE — Telephone Encounter (Signed)
HIPAA compliant callback message left.  Please let her know that her PAP test is normal whenever she calls back.

## 2020-06-04 ENCOUNTER — Other Ambulatory Visit: Payer: Self-pay

## 2020-06-04 ENCOUNTER — Ambulatory Visit
Admission: RE | Admit: 2020-06-04 | Discharge: 2020-06-04 | Disposition: A | Discharge status: Home / Self Care | Payer: Medicaid Other | Source: Ambulatory Visit | Attending: Family Medicine | Admitting: Family Medicine

## 2020-06-04 DIAGNOSIS — N63 Unspecified lump in unspecified breast: Secondary | ICD-10-CM

## 2020-06-04 DIAGNOSIS — N6021 Fibroadenosis of right breast: Secondary | ICD-10-CM | POA: Diagnosis not present

## 2020-06-08 ENCOUNTER — Encounter: Payer: Self-pay | Admitting: Family Medicine

## 2020-06-10 ENCOUNTER — Other Ambulatory Visit: Payer: Self-pay | Admitting: Family Medicine

## 2020-06-10 NOTE — Telephone Encounter (Addendum)
I called the patient to inform her of the new prescription. However, she said she just found her Diflucan at home and she just took it.  Prescription canceled.

## 2020-06-10 NOTE — Addendum Note (Signed)
Addended by: Janit Pagan T on: 06/10/2020 11:21 AM   Modules accepted: Orders

## 2020-06-10 NOTE — Telephone Encounter (Signed)
I called pharmacy and they said she was asking for refills. I called her and she said her brother picked up her meds and never gave her the diflucan.  She is advised to come in and pick up her script to avoid missed prescription.   Page, please call her and let her know that we will not be sending another script to her pharmacy. Her prescription will be available for her in the front office upon her arrival for pick up. Thanks.

## 2020-07-13 ENCOUNTER — Other Ambulatory Visit: Payer: Self-pay

## 2020-07-13 ENCOUNTER — Encounter: Payer: Self-pay | Admitting: Family Medicine

## 2020-07-13 ENCOUNTER — Ambulatory Visit (INDEPENDENT_AMBULATORY_CARE_PROVIDER_SITE_OTHER): Payer: Medicaid Other | Admitting: Family Medicine

## 2020-07-13 VITALS — BP 118/62 | HR 79 | Ht 59.0 in | Wt 134.0 lb

## 2020-07-13 DIAGNOSIS — R300 Dysuria: Secondary | ICD-10-CM | POA: Diagnosis not present

## 2020-07-13 DIAGNOSIS — R350 Frequency of micturition: Secondary | ICD-10-CM

## 2020-07-13 DIAGNOSIS — R251 Tremor, unspecified: Secondary | ICD-10-CM | POA: Diagnosis not present

## 2020-07-13 DIAGNOSIS — G250 Essential tremor: Secondary | ICD-10-CM | POA: Diagnosis not present

## 2020-07-13 LAB — POCT URINALYSIS DIP (MANUAL ENTRY)
Bilirubin, UA: NEGATIVE
Glucose, UA: NEGATIVE mg/dL
Ketones, POC UA: NEGATIVE mg/dL
Nitrite, UA: POSITIVE — AB
Protein Ur, POC: 30 mg/dL — AB
Spec Grav, UA: 1.025 (ref 1.010–1.025)
Urobilinogen, UA: 0.2 E.U./dL
pH, UA: 6 (ref 5.0–8.0)

## 2020-07-13 LAB — POCT UA - MICROSCOPIC ONLY
Epithelial cells, urine per micros: >20
WBC, Ur, HPF, POC: >20

## 2020-07-13 LAB — POCT URINE PREGNANCY: Preg Test, Ur: NEGATIVE

## 2020-07-13 MED ORDER — CEPHALEXIN 500 MG PO CAPS
500.0000 mg | ORAL_CAPSULE | Freq: Two times a day (BID) | ORAL | 0 refills | Status: DC
Start: 2020-07-13 — End: 2021-03-23

## 2020-07-13 NOTE — Progress Notes (Signed)
SUBJECTIVE:   CHIEF COMPLAINT / HPI:   Dysuria  This is a new problem. The current episode started 1 to 4 weeks ago. The problem occurs every urination. The problem has been gradually worsening. Quality: burning and irritation post void. There has been no fever. Associated symptoms include frequency. Pertinent negatives include no discharge, nausea or vomiting. Associated symptoms comments: + urine odor. She has tried nothing (Hydration) for the symptoms.   Tremors: C/O shaky head x 1 year. Her head tremors occurs whenever she tries to focus or concentrate on something, also occurs when she is upset. Denies associated headache, but does have HA occasionally. Sometimes she sees spots in her vision randomly, but not related to shaky head.  Drinks cafe with shots of expressor daily. Denies alcohol intake. She smokes 8-9 sticks of cigarettes daily.    PERTINENT  PMH / PSH: PMX reviewed.  OBJECTIVE:   BP (!) 118/62   Pulse 79   Ht 4\' 11"  (1.499 m)   Wt 134 lb (60.8 kg)   LMP 05/19/2020 (Exact Date)   SpO2 98%   Breastfeeding No   BMI 27.06 kg/m   Physical Exam Vitals and nursing note reviewed.  Constitutional:      Appearance: Normal appearance.  HENT:     Head: Normocephalic and atraumatic.  Eyes:     Extraocular Movements: Extraocular movements intact.     Pupils: Pupils are equal, round, and reactive to light.  Cardiovascular:     Rate and Rhythm: Normal rate and regular rhythm.     Heart sounds: Normal heart sounds. No murmur heard.   Pulmonary:     Effort: Pulmonary effort is normal. No respiratory distress.     Breath sounds: Normal breath sounds. No wheezing or rhonchi.  Abdominal:     General: Abdomen is flat. Bowel sounds are normal. There is no distension.     Palpations: Abdomen is soft. There is no mass.     Tenderness: There is no abdominal tenderness.  Musculoskeletal:        General: Normal range of motion.     Cervical back: Normal range of motion and  neck supple. No rigidity.     Right lower leg: No edema.     Left lower leg: No edema.  Neurological:     General: No focal deficit present.     Mental Status: She is alert and oriented to person, place, and time.     Cranial Nerves: No cranial nerve deficit.     Sensory: No sensory deficit.     Motor: No tremor.     Gait: Gait is intact.     Deep Tendon Reflexes: Reflexes are normal and symmetric.     Comments: No tremors of her hands or head with eyes closed.      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 She endorsed similar presentation in her son who now is scheduled to see a neurologist. As discussed with her, intracranial pathology is less likely given the chronicity of her symptoms, her age and normal neuro exam. Possible metabolic or endocrine disorder although less likely. Obtain Cmet, TSH to r/o metabolic or endocrine disorder. I discussed CBT vs neuro referral for a second opinion. She prefers to defer both CBT and neurology referral. I will contact her with lab results.   Dysuria: 07/19/2020 suggestive of UTI. Urine sent to lab for culture. Started on Keflex 500 mg BID  pending culture report. I called her after visit to discuss the result and management plan. She agreed with the plan. F/U as needed.  Janit Pagan, MD [facility]

## 2020-07-13 NOTE — Assessment & Plan Note (Signed)
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 She endorsed similar presentation in her son who now is scheduled to see a neurologist. As discussed with her, intracranial pathology is less likely given the chronicity of her symptoms, her age and normal neuro exam. Possible metabolic or endocrine disorder although less likely. Obtain Cmet, TSH to r/o metabolic or endocrine disorder. I discussed CBT vs neuro referral for a second opinion. She prefers to defer both CBT and neurology referral. I will contact her with lab results.

## 2020-07-13 NOTE — Patient Instructions (Signed)

## 2020-07-14 ENCOUNTER — Telehealth: Payer: Self-pay | Admitting: Family Medicine

## 2020-07-14 LAB — CMP14+EGFR
ALT: 8 IU/L (ref 0–32)
AST: 18 IU/L (ref 0–40)
Albumin/Globulin Ratio: 2 (ref 1.2–2.2)
Albumin: 4.8 g/dL (ref 3.8–4.8)
Alkaline Phosphatase: 66 IU/L (ref 48–121)
BUN/Creatinine Ratio: 12 (ref 9–23)
BUN: 9 mg/dL (ref 6–20)
Bilirubin Total: 0.2 mg/dL (ref 0.0–1.2)
CO2: 23 mmol/L (ref 20–29)
Calcium: 9.4 mg/dL (ref 8.7–10.2)
Chloride: 102 mmol/L (ref 96–106)
Creatinine, Ser: 0.76 mg/dL (ref 0.57–1.00)
GFR calc Af Amer: 118 mL/min/{1.73_m2} (ref >59)
GFR calc non Af Amer: 103 mL/min/{1.73_m2} (ref >59)
Globulin, Total: 2.4 g/dL (ref 1.5–4.5)
Glucose: 64 mg/dL — ABNORMAL LOW (ref 65–99)
Potassium: 4.6 mmol/L (ref 3.5–5.2)
Sodium: 138 mmol/L (ref 134–144)
Total Protein: 7.2 g/dL (ref 6.0–8.5)

## 2020-07-14 LAB — TSH: TSH: 0.466 u[IU]/mL (ref 0.450–4.500)

## 2020-07-14 NOTE — Telephone Encounter (Signed)
HIPAA compliant callback message left.   Please advise when she calls back that lab results looks good and did not explain reason for her head tremors. Even though her glucose was slightly low.  If this continues or worsens, we can consider neuro vs CBT referral per our discussion. She should please let us know when she decides on referral.

## 2020-07-14 NOTE — Telephone Encounter (Signed)
Patient returns call to nurse line, informed of below. Patient does not wish to pursue referral to specialist at this time.   Veronda Prude, RN

## 2020-07-15 ENCOUNTER — Telehealth: Payer: Self-pay | Admitting: Family Medicine

## 2020-07-15 LAB — URINE CULTURE

## 2020-07-15 NOTE — Telephone Encounter (Signed)
Urine culture report discussed.  She started her Keflex already which is a sensitive A/B, although she stated that she is not quite better.  She is advised to f/u with me soon if no improvement after treatment.  She agreed with the plan.

## 2020-08-27 ENCOUNTER — Emergency Department (HOSPITAL_COMMUNITY)
Admission: EM | Admit: 2020-08-27 | Discharge: 2020-08-27 | Discharge status: Absent without leave | Payer: No Typology Code available for payment source

## 2020-08-27 ENCOUNTER — Other Ambulatory Visit: Payer: Self-pay

## 2020-08-28 ENCOUNTER — Other Ambulatory Visit: Payer: Self-pay

## 2020-08-28 ENCOUNTER — Encounter (HOSPITAL_COMMUNITY): Payer: Self-pay | Admitting: Emergency Medicine

## 2020-08-28 ENCOUNTER — Ambulatory Visit (HOSPITAL_COMMUNITY)
Admission: EM | Admit: 2020-08-28 | Discharge: 2020-08-28 | Disposition: A | Discharge status: Home / Self Care | Payer: Medicaid Other | Attending: Family Medicine | Admitting: Family Medicine

## 2020-08-28 DIAGNOSIS — S161XXA Strain of muscle, fascia and tendon at neck level, initial encounter: Secondary | ICD-10-CM

## 2020-08-28 MED ORDER — CYCLOBENZAPRINE HCL 10 MG PO TABS: 5.0000 mg | ORAL_TABLET | Freq: Three times a day (TID) | ORAL | 0 refills | Status: DC | PRN
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
## 2020-08-30 ENCOUNTER — Telehealth: Payer: Self-pay | Admitting: *Deleted

## 2020-08-30 NOTE — Telephone Encounter (Signed)
Leah Walls presented to the ED and left before being seen by the provider on 08/27/20. The patient has been enrolled in an automated general discharge outreach program and 2 attempts to contact the patient will be made to follow up on their ED visit and subsequent needs. The care management team is available to provide assistance to this patient at any time.   Burnard Bunting, RN, BSN, CCRN [facility] [phone]

## 2020-08-30 NOTE — Telephone Encounter (Signed)
Email sent to Family Medicine  to schedule follow up appointment .   Leah Walls    PEC [phone] 

## 2020-09-05 ENCOUNTER — Ambulatory Visit (INDEPENDENT_AMBULATORY_CARE_PROVIDER_SITE_OTHER): Payer: Medicaid Other

## 2020-09-05 ENCOUNTER — Encounter (HOSPITAL_COMMUNITY): Payer: Self-pay | Admitting: *Deleted

## 2020-09-05 ENCOUNTER — Ambulatory Visit (HOSPITAL_COMMUNITY)
Admission: EM | Admit: 2020-09-05 | Discharge: 2020-09-05 | Disposition: A | Discharge status: Home / Self Care | Payer: Medicaid Other | Attending: Emergency Medicine | Admitting: Emergency Medicine

## 2020-09-05 ENCOUNTER — Other Ambulatory Visit: Payer: Self-pay

## 2020-09-05 DIAGNOSIS — M545 Low back pain, unspecified: Secondary | ICD-10-CM

## 2020-09-05 DIAGNOSIS — T63441A Toxic effect of venom of bees, accidental (unintentional), initial encounter: Secondary | ICD-10-CM

## 2020-09-05 HISTORY — DX: Gastro-esophageal reflux disease without esophagitis: K21.9

## 2020-09-05 MED ORDER — PREDNISONE 10 MG (21) PO TBPK: ORAL_TABLET | Freq: Every day | ORAL | 0 refills | Status: DC

## 2020-09-05 MED ORDER — CETIRIZINE HCL 10 MG PO TABS: 10.0000 mg | ORAL_TABLET | Freq: Every day | ORAL | 0 refills | Status: DC

## 2020-09-05 MED ORDER — NAPROXEN 500 MG PO TABS: 500.0000 mg | ORAL_TABLET | Freq: Two times a day (BID) | ORAL | 0 refills | Status: DC
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
## 2020-09-05 NOTE — Discharge Instructions (Signed)
Light and regular activity as tolerated.  See exercises provided.  Heat application while active can help with muscle spasms.  Sleep with pillow under your knees.   Your xray is normal today which is reassuring.  You may continue with the muscle relaxer as needed.  I have sent a prednisone pack to help with your back pain as well as with your bee sting.  You may also take zyrtec to help with the bee sting. Apply cool compresses.  Naproxen twice a day as needed for back pain once the prednisone is completed.  Please follow up with your primary care provider as you may need further evaluation and long term management if your symptoms persist.
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
## 2020-09-27 ENCOUNTER — Telehealth: Payer: Self-pay

## 2020-09-27 MED ORDER — LORAZEPAM 0.5 MG PO TABS: ORAL_TABLET | ORAL | 0 refills | Status: DC

## 2020-09-27 NOTE — Telephone Encounter (Signed)
I am unaware of her getting an MRI. Who ordered it/  SHe can get medication from her MRI ordering physician.

## 2020-09-27 NOTE — Telephone Encounter (Signed)
Yes. Rx sent.  

## 2020-09-27 NOTE — Telephone Encounter (Signed)
Patient calls nurse line requesting anti- anxiety medication for MRI. Patient attempted to have scan last week, however, was unable to complete due to anxiety.   Please advise if patient can receive medication. Patient will also need assistance with rescheduling after medication is sent in.   Veronda Prude, RN

## 2020-09-28 ENCOUNTER — Other Ambulatory Visit: Payer: Self-pay | Admitting: Student in an Organized Health Care Education/Training Program

## 2020-09-28 NOTE — Telephone Encounter (Signed)
Attempted to call patient but number listed for her is not working.  I also called her mother but there was a female that answered that states he would pass the message along for her to call back.  Luqman Perrelli,CMA

## 2020-11-08 ENCOUNTER — Telehealth: Payer: Self-pay

## 2020-11-08 NOTE — Telephone Encounter (Signed)
Patient calls nurse line reporting a Covid positive test. Patient was advised to contact her doctor. Conservative measures given to patient, along with CDC quarantine guidelines. ED precautions given to patient for high fever and/or SOB. Patient appreciative of call.

## 2020-11-15 ENCOUNTER — Other Ambulatory Visit: Payer: Self-pay

## 2020-11-15 MED ORDER — ALBUTEROL SULFATE HFA 108 (90 BASE) MCG/ACT IN AERS: 2.0000 | INHALATION_SPRAY | Freq: Four times a day (QID) | RESPIRATORY_TRACT | 2 refills | Status: DC | PRN

## 2020-11-30 ENCOUNTER — Emergency Department (HOSPITAL_COMMUNITY)
Admission: EM | Admit: 2020-11-30 | Discharge: 2020-11-30 | Disposition: A | Discharge status: Home / Self Care | Payer: Medicaid Other | Attending: Emergency Medicine | Admitting: Emergency Medicine

## 2020-11-30 ENCOUNTER — Other Ambulatory Visit: Payer: Self-pay

## 2020-11-30 ENCOUNTER — Encounter (HOSPITAL_COMMUNITY): Payer: Self-pay

## 2020-11-30 DIAGNOSIS — Z5321 Procedure and treatment not carried out due to patient leaving prior to being seen by health care provider: Secondary | ICD-10-CM | POA: Diagnosis not present

## 2020-11-30 DIAGNOSIS — F309 Manic episode, unspecified: Secondary | ICD-10-CM | POA: Insufficient documentation

## 2020-11-30 NOTE — ED Triage Notes (Signed)
Pts family member brought pt in due to manic behavior. Pts family reports that she has been constantly rambling about God and saying things that do not make sense. Pt is rambling in triage non-stop. Unable to ask appropriate triage questions at this time. Pts family states they do not believe she is SI/HI, but she has no hx of mental health illness.

## 2020-11-30 NOTE — ED Notes (Signed)
Pt walked out of the room and asked for the exit. Staff asked pt where she was going and pt stated she wanted to leave. Staff attempted to convince pt to stay to be seen by a provider. Pt said she hasn't done anything wrong and that she can leave. Pt was not IVC'd at this time. This RN called family member, [place], who brought the pt in right when pt left. Family member reported that the pt was in the truck with him. Family member attempted to convince pt to come back in to be treated. Pt was unwilling. Family member reports that he is going to IVC pt.

## 2020-12-10 ENCOUNTER — Telehealth: Payer: Self-pay | Admitting: Family Medicine

## 2020-12-10 NOTE — Telephone Encounter (Signed)
I contacted her to schedule or update vaccine- she stated that she does not get flu shot and will not be getting the covid-19 shot. I did encourage her to call in the future should she decide on vaccination. She verbalized understanding. Record updated.

## 2020-12-18 NOTE — L&D Delivery Note (Addendum)
CNM called to [facility] due to patient actively delivering. Upon arrival, baby on patient's abdomen.  Delivery Note At 1800, a viable baby was delivered by emergency room staff  APGAR: 8, 9; weight 6 lb 11.6 oz (3050 g).   Placenta status:  spontaneous, intact delivered by CNM in [facility].  Cord: 3 vessels  Anesthesia:  none Episiotomy:  n/a Lacerations:  none Suture Repair:  n/a Est. Blood Loss (mL):  50  Mom to postpartum.  Baby to Couplet care / Skin to Skin.  Rolm Bookbinder CNM 11/11/2021, 6:45 PM

## 2021-03-23 ENCOUNTER — Other Ambulatory Visit: Payer: Self-pay

## 2021-03-23 ENCOUNTER — Emergency Department (HOSPITAL_COMMUNITY)
Admission: EM | Admit: 2021-03-23 | Discharge: 2021-03-23 | Disposition: A | Discharge status: Home / Self Care | Payer: Medicaid Other | Attending: Emergency Medicine | Admitting: Emergency Medicine

## 2021-03-23 ENCOUNTER — Encounter (HOSPITAL_COMMUNITY): Payer: Self-pay

## 2021-03-23 DIAGNOSIS — O99611 Diseases of the digestive system complicating pregnancy, first trimester: Secondary | ICD-10-CM | POA: Insufficient documentation

## 2021-03-23 DIAGNOSIS — O99331 Smoking (tobacco) complicating pregnancy, first trimester: Secondary | ICD-10-CM | POA: Insufficient documentation

## 2021-03-23 DIAGNOSIS — R1010 Upper abdominal pain, unspecified: Secondary | ICD-10-CM

## 2021-03-23 DIAGNOSIS — F17210 Nicotine dependence, cigarettes, uncomplicated: Secondary | ICD-10-CM | POA: Diagnosis not present

## 2021-03-23 DIAGNOSIS — Z3490 Encounter for supervision of normal pregnancy, unspecified, unspecified trimester: Secondary | ICD-10-CM

## 2021-03-23 DIAGNOSIS — O2341 Unspecified infection of urinary tract in pregnancy, first trimester: Secondary | ICD-10-CM | POA: Diagnosis present

## 2021-03-23 DIAGNOSIS — N3001 Acute cystitis with hematuria: Secondary | ICD-10-CM

## 2021-03-23 DIAGNOSIS — O2311 Infections of bladder in pregnancy, first trimester: Secondary | ICD-10-CM | POA: Diagnosis not present

## 2021-03-23 DIAGNOSIS — Z3A00 Weeks of gestation of pregnancy not specified: Secondary | ICD-10-CM | POA: Insufficient documentation

## 2021-03-23 DIAGNOSIS — O26891 Other specified pregnancy related conditions, first trimester: Secondary | ICD-10-CM | POA: Diagnosis not present

## 2021-03-23 DIAGNOSIS — K30 Functional dyspepsia: Secondary | ICD-10-CM | POA: Insufficient documentation

## 2021-03-23 LAB — URINALYSIS, ROUTINE W REFLEX MICROSCOPIC
Bilirubin Urine: NEGATIVE
Glucose, UA: NEGATIVE mg/dL
Hgb urine dipstick: NEGATIVE
Ketones, ur: 20 mg/dL — AB
Nitrite: POSITIVE — AB
Protein, ur: 100 mg/dL — AB
Specific Gravity, Urine: 1.023 (ref 1.005–1.030)
pH: 6 (ref 5.0–8.0)

## 2021-03-23 LAB — CBC
HCT: 34.5 % — ABNORMAL LOW (ref 36.0–46.0)
Hemoglobin: 11.3 g/dL — ABNORMAL LOW (ref 12.0–15.0)
MCH: 29.2 pg (ref 26.0–34.0)
MCHC: 32.8 g/dL (ref 30.0–36.0)
MCV: 89.1 fL (ref 80.0–100.0)
Platelets: 284 10*3/uL (ref 150–400)
RBC: 3.87 MIL/uL (ref 3.87–5.11)
RDW: 13.1 % (ref 11.5–15.5)
WBC: 8.6 10*3/uL (ref 4.0–10.5)
nRBC: 0 % (ref 0.0–0.2)

## 2021-03-23 LAB — HCG, QUANTITATIVE, PREGNANCY: hCG, Beta Chain, Quant, S: 28304 m[IU]/mL — ABNORMAL HIGH (ref <5)

## 2021-03-23 LAB — COMPREHENSIVE METABOLIC PANEL
ALT: 8 U/L (ref 0–44)
AST: 11 U/L — ABNORMAL LOW (ref 15–41)
Albumin: 3.8 g/dL (ref 3.5–5.0)
Alkaline Phosphatase: 44 U/L (ref 38–126)
Anion gap: 8 (ref 5–15)
BUN: 8 mg/dL (ref 6–20)
CO2: 21 mmol/L — ABNORMAL LOW (ref 22–32)
Calcium: 9.4 mg/dL (ref 8.9–10.3)
Chloride: 106 mmol/L (ref 98–111)
Creatinine, Ser: 0.71 mg/dL (ref 0.44–1.00)
GFR, Estimated: >60 mL/min (ref >60)
Glucose, Bld: 98 mg/dL (ref 70–99)
Potassium: 3.2 mmol/L — ABNORMAL LOW (ref 3.5–5.1)
Sodium: 135 mmol/L (ref 135–145)
Total Bilirubin: 0.6 mg/dL (ref 0.3–1.2)
Total Protein: 7.2 g/dL (ref 6.5–8.1)

## 2021-03-23 LAB — LIPASE, BLOOD: Lipase: 29 U/L (ref 11–51)

## 2021-03-23 LAB — I-STAT BETA HCG BLOOD, ED (MC, WL, AP ONLY): I-stat hCG, quantitative: >2000 m[IU]/mL — ABNORMAL HIGH (ref <5)

## 2021-03-23 MED ORDER — ONDANSETRON 4 MG PO TBDP
4.0000 mg | ORAL_TABLET | Freq: Once | ORAL | Status: AC
  Administered 2021-03-23: 4 mg via ORAL
  Filled 2021-03-23: qty 1

## 2021-03-23 MED ORDER — LIDOCAINE VISCOUS HCL 2 % MT SOLN
15.0000 mL | Freq: Once | OROMUCOSAL | Status: AC
  Administered 2021-03-23: 15 mL via ORAL
  Filled 2021-03-23: qty 15

## 2021-03-23 MED ORDER — ALUM & MAG HYDROXIDE-SIMETH 200-200-20 MG/5ML PO SUSP
30.0000 mL | Freq: Once | ORAL | Status: AC
  Administered 2021-03-23: 30 mL via ORAL
  Filled 2021-03-23: qty 30

## 2021-03-23 MED ORDER — CEPHALEXIN 500 MG PO CAPS: 500.0000 mg | ORAL_CAPSULE | Freq: Four times a day (QID) | ORAL | 0 refills | Status: DC

## 2021-03-23 NOTE — ED Provider Notes (Signed)
[facility] EMERGENCY DEPARTMENT Provider Note   CSN: [id number] Arrival date & time: 03/23/21  0206     History Chief Complaint  Patient presents with  . Abdominal Pain    Leah Walls is a 35 y.o. female.  Patient to ED with c/o sharp pain across the upper abdomen that started earlier tonight after she ate dinner. History of reflux on Prilosec. She reports having similar pain but never this intense. She is nauseous but has no vomiting. No fever. Denies urinary symptoms, chest pain, SOB.   The history is provided by the patient. No language interpreter was used.  Abdominal Pain Associated symptoms: nausea   Associated symptoms: no chills, no dysuria, no fever and no shortness of breath        Past Medical History:  Diagnosis Date  . GERD (gastroesophageal reflux disease)   . Gestational diabetes   . Headache   . Hernia, abdominal 2011  . Nexplanon removal 10/23/2019  . Supervision of other normal pregnancy, antepartum 05/17/2018    Nursing Staff Provider Office Location  [facility] Dating   Language  English Anatomy US  Normal female Flu Vaccine  Declined  Genetic Screen   NIPS: low risk  TDaP vaccine   08/27/18 Hgb A1C or  GTT Early  Third trimester: GDM  Rhogam  n/a   LAB RESULTS  Feeding Plan Breast Blood Type O/Positive/-- (05/31 1059) O pos Contraception nexplanon Antibody Negative (05/31 1059)neg Circumcision Female  Rub    Patient Active Problem List   Diagnosis Date Noted  . Benign head tremor 07/13/2020  . Tobacco abuse 06/01/2020  . Wheezing 06/01/2020  . Gastroesophageal reflux disease without esophagitis 09/19/2019  . History of gestational diabetes 08/29/2018  . Umbilical hernia 11/15/2016  . Genital herpes 10/18/2016  . Marijuana use 07/30/2016  . Breast lump on right side at 1 o'clock position 07/24/2016    Past Surgical History:  Procedure Laterality Date  . CESAREAN SECTION    . DILATION AND EVACUATION Bilateral 07/14/2015    Procedure: DILATATION AND EVACUATION;  Surgeon: Kathreen Cosier, MD;  Location: [facility];  Service: Gynecology;  Laterality: Bilateral;  . FOREARM FRACTURE SURGERY Right 2008  . WISDOM TOOTH EXTRACTION       OB History    Gravida  7   Para  7   Term  6   Preterm  1   AB  0   Living  7     SAB  0   IAB  0   Ectopic  0   Multiple  0   Live Births  5           Family History  Problem Relation Age of Onset  . Hypertension Mother   . Hypertension Maternal Grandmother   . Hypertension Paternal Grandmother   . Hypertension Father   . Colon cancer Neg Hx   . Esophageal cancer Neg Hx   . Rectal cancer Neg Hx     Social History   Tobacco Use  . Smoking status: Current Some Day Smoker    Packs/day: 0.50    Types: Cigarettes  . Smokeless tobacco: Never Used  Vaping Use  . Vaping Use: Never used  Substance Use Topics  . Alcohol use: No  . Drug use: No    Home Medications Prior to Admission medications   Medication Sig Start Date End Date Taking? Authorizing Provider  albuterol (VENTOLIN HFA) 108 (90 Base) MCG/ACT inhaler Inhale 2 puffs into the  lungs every 6 (six) hours as needed for wheezing or shortness of breath. 11/15/20   Doreene Eland, MD  cephALEXin (KEFLEX) 500 MG capsule Take 1 capsule (500 mg total) by mouth 2 (two) times daily. 07/13/20   Doreene Eland, MD  cetirizine (ZYRTEC) 10 MG tablet Take 1 tablet (10 mg total) by mouth daily. 09/05/20   Georgetta Haber, NP  cyclobenzaprine (FLEXERIL) 10 MG tablet Take 0.5-1 tablets (5-10 mg total) by mouth 3 (three) times daily as needed for muscle spasms. DO NOT DRIVE OR DRINK ALCOHOL WHILE TAKING THIS MEDICATION 08/28/20   Particia Nearing, PA-C  LORazepam (ATIVAN) 0.5 MG tablet Take on tab 1 hour prior to test.  May repeat dose 15 minutes prior to exam if still anxious. 09/27/20   [place] Manners, MD  naproxen (NAPROSYN) 500 MG tablet Take 1 tablet (500 mg total) by mouth 2 (two) times daily.  09/05/20   Georgetta Haber, NP  Norgestimate-Ethinyl Estradiol Triphasic (ORTHO TRI-CYCLEN LO) 0.18/0.215/0.25 MG-25 MCG tab Take 1 tablet by mouth daily. 06/01/20   Doreene Eland, MD  omeprazole (PRILOSEC) 40 MG capsule TAKE 1 CAPSULE BY MOUTH EVERY DAY 09/29/20   [place] Manners, MD  predniSONE (STERAPRED UNI-PAK 21 TAB) 10 MG (21) TBPK tablet Take by mouth daily. Per box instruction 09/05/20   Linus Mako B, NP  amLODipine (NORVASC) 5 MG tablet Take 1 tablet (5 mg total) by mouth daily. 12/05/18 08/22/19  Rasch, Victorino Dike I, NP  promethazine (PHENERGAN) 25 MG tablet Take 1 tablet (25 mg total) by mouth every 6 (six) hours as needed for nausea or vomiting. Patient not taking: Reported on 12/05/2018 10/25/18 08/22/19  Tereso Newcomer, MD    Allergies    Patient has no known allergies.  Review of Systems   Review of Systems  Constitutional: Negative for chills and fever.  HENT: Negative.   Respiratory: Negative.  Negative for shortness of breath.   Cardiovascular: Negative.   Gastrointestinal: Positive for abdominal pain and nausea.  Genitourinary: Negative for dysuria.  Musculoskeletal: Negative.   Skin: Negative.   Neurological: Negative.     Physical Exam Updated Vital Signs BP 98/64 (BP Location: Left Arm)   Pulse 73   Temp 98.4 F (36.9 C) (Oral)   Resp 18   Ht 4\' 11"  (1.499 m)   Wt 60.8 kg   SpO2 97%   BMI 27.07 kg/m   Physical Exam Vitals and nursing note reviewed.  Constitutional:      Appearance: She is well-developed.  HENT:     Head: Normocephalic.  Cardiovascular:     Rate and Rhythm: Normal rate and regular rhythm.  Pulmonary:     Effort: Pulmonary effort is normal.     Breath sounds: Normal breath sounds.  Abdominal:     Palpations: Abdomen is soft.     Tenderness: There is abdominal tenderness in the right upper quadrant, epigastric area and left upper quadrant. There is no guarding or rebound.  Musculoskeletal:        General: Normal range of  motion.     Cervical back: Normal range of motion and neck supple.  Skin:    General: Skin is warm and dry.     Findings: No rash.  Neurological:     Mental Status: She is alert.     Cranial Nerves: No cranial nerve deficit.     ED Results / Procedures / Treatments   Labs (all labs ordered are listed, but  only abnormal results are displayed) Labs Reviewed  COMPREHENSIVE METABOLIC PANEL - Abnormal; Notable for the following components:      Result Value   Potassium 3.2 (*)    CO2 21 (*)    AST 11 (*)    All other components within normal limits  CBC - Abnormal; Notable for the following components:   Hemoglobin 11.3 (*)    HCT 34.5 (*)    All other components within normal limits  URINALYSIS, ROUTINE W REFLEX MICROSCOPIC - Abnormal; Notable for the following components:   Color, Urine AMBER (*)    APPearance HAZY (*)    Ketones, ur 20 (*)    Protein, ur 100 (*)    Nitrite POSITIVE (*)    Leukocytes,Ua SMALL (*)    Bacteria, UA MANY (*)    All other components within normal limits  I-STAT BETA HCG BLOOD, ED ([facility], [facility], [facility] ONLY) - Abnormal; Notable for the following components:   I-stat hCG, quantitative >2,000.0 (*)    All other components within normal limits  LIPASE, BLOOD    EKG None  Radiology No results found.  Procedures Procedures   Medications Ordered in ED Medications  alum & mag hydroxide-simeth (MAALOX/MYLANTA) 200-200-20 MG/5ML suspension 30 mL (30 mLs Oral Given 03/23/21 0249)    And  lidocaine (XYLOCAINE) 2 % viscous mouth solution 15 mL (15 mLs Oral Given 03/23/21 0249)  ondansetron (ZOFRAN-ODT) disintegrating tablet 4 mg (4 mg Oral Given 03/23/21 0251)    ED Course  I have reviewed the triage vital signs and the nursing notes.  Pertinent labs & imaging results that were available during my care of the patient were reviewed by me and considered in my medical decision making (see chart for details).    MDM Rules/Calculators/A&P                           Patient to ED with ss/sxs as per HPI.   She is uncomfortable appearing. GI cocktail ordered while labs pending. On recheck, she is vomiting and is given Zofran ODT.   VSS stable. Mild hypotension c/w documented past history. She reports that on re-check she is pain free. She is sleeping and is significantly more comfortable.   Labs reassuring with no leukocytosis, no LFT dysfunction, no evidence of pancreatitis. No specific RUQ pain to suggest cholecystitis. No fever.   UA appears positive for infection with many bacteria, +nitrite. HCG >2000. When this was discussed with the patient she states she feels this is a false positive test and requests confirmatory test, which is collected and pending. She will be discharged when final test resulted.   Final Clinical Impression(s) / ED Diagnoses Final diagnoses:  None   1. UTI 2. Indigestion 3. Pregnancy   Rx / DC Orders ED Discharge Orders    None       Elpidio Anis, PA-C 03/23/21 2409    Geoffery Lyons, MD 03/24/21 [phone]

## 2021-03-23 NOTE — ED Triage Notes (Signed)
Generalized abdominal pain with nausea. Denies any diarrhea, dysuria and constipation.

## 2021-03-23 NOTE — ED Provider Notes (Signed)
MSE was initiated and I personally evaluated the patient and placed orders (if any) at  2:45 AM on March 23, 2021.  Patient to ED with sharp, severe abdominal pain onset tonight. She reports having eaten at [facility] earlier. H/O reflux on Prilosec but current symptoms are new. No nausea, vomiting. No fever.   Today's Vitals   03/23/21 0210 03/23/21 0211  BP:  115/76  Pulse:  (!) 103  Resp:  16  Temp:  98.4 F (36.9 C)  TempSrc:  Oral  SpO2:  100%  Weight: 60.8 kg   Height: 4\' 11"  (1.499 m)   PainSc: 10-Worst pain ever    Body mass index is 27.07 kg/m.   The patient appears stable so that the remainder of the MSE may be completed by another provider.   , PA-C 03/23/21 0246    05/23/21, MD 03/23/21 [phone]

## 2021-03-23 NOTE — Discharge Instructions (Addendum)
Your upper abdominal pain is felt to be related to your acid reflux after eating [employer]. Take Prilosec twice daily for the next 3 days then go back to once daily.   Your urine appears infected tonight, which is felt to be incidental to the upper abdominal pain you were experiencing earlier. Take the antibiotic as prescribed and follow up with your doctor in one week to insure the infection is resolved.   Your lab pregnancy test is elevated which confirms your pregnancy. Follow up with OB/GYN for prenatal care.

## 2021-03-23 NOTE — ED Notes (Signed)
Patient given discharge instructions. Questions were answered. Patient verbalized understanding of discharge instructions and care at home.  

## 2021-03-24 ENCOUNTER — Telehealth: Payer: Self-pay

## 2021-03-24 NOTE — Telephone Encounter (Signed)
Transition Care Management Unsuccessful Follow-up Telephone Call  Date of discharge and from where:  03/23/2021 from [facility]  Attempts:  1st Attempt  Reason for unsuccessful TCM follow-up call:  Unable to leave message

## 2021-03-25 NOTE — Telephone Encounter (Signed)
Transition Care Management Unsuccessful Follow-up Telephone Call  Date of discharge and from where:  03/23/2021 from [facility]  Attempts:  2nd Attempt  Reason for unsuccessful TCM follow-up call:  Unable to leave message

## 2021-03-28 NOTE — Telephone Encounter (Deleted)
Transition Care Management Unsuccessful Follow-up Telephone Call ? ?Date of discharge and from where:  *** ? ?Attempts:  {TOC Attempted Calls:24207} ? ?Reason for unsuccessful TCM follow-up call:  {REASON FOR UNSUCCESSFUL TOC CALL:24206} ? ?  ?

## 2021-03-28 NOTE — Telephone Encounter (Signed)
Transition Care Management Unsuccessful Follow-up Telephone Call  Date of discharge and from where:  03/23/2021 from [facility]  Attempts:  3rd Attempt  Reason for unsuccessful TCM follow-up call:  Unable to reach patient

## 2021-04-05 ENCOUNTER — Encounter (HOSPITAL_COMMUNITY): Payer: Self-pay | Admitting: *Deleted

## 2021-04-05 ENCOUNTER — Inpatient Hospital Stay (HOSPITAL_COMMUNITY)
Admission: AD | Admit: 2021-04-05 | Discharge: 2021-04-06 | Discharge status: Against Medical Advice | Payer: Medicaid Other | Attending: Family Medicine | Admitting: Family Medicine

## 2021-04-05 ENCOUNTER — Other Ambulatory Visit: Payer: Self-pay

## 2021-04-06 ENCOUNTER — Telehealth: Payer: Self-pay

## 2021-04-06 ENCOUNTER — Encounter (HOSPITAL_COMMUNITY): Payer: Self-pay | Admitting: Family Medicine

## 2021-04-06 NOTE — Telephone Encounter (Signed)
Patient calls nurse line requesting medication for nausea. Reports onset of nausea approx 3 days ago. Reports vomiting and is unable to keep down solids, but is able to keep down fluids. Patient reports going to MAU last night because she was "seeing spots." Per chart review, patient LWBS. Advised patient that she would need to be seen prior to initiation of new medication. Patient is requesting to be seen as soon as possible, to discuss nausea medication. Patient has not had initial OB visit yet, however, due to symptoms scheduled patient for clinic evaluation.   Scheduled patient for follow up in clinic tomorrow for further evaluation.   Provided with MAU precautions.   Veronda Prude, RN

## 2021-04-06 NOTE — MAU Note (Addendum)
PT SAYS SHE FOUND OUT PREG LAST MTH.  SAYS ON 8 WEEKS PREG  HAS BEEN VOMITING - STARTED X1 WEEK. HAS AN APPOINTMENT  ON [facility] STR 4-25

## 2021-04-07 ENCOUNTER — Ambulatory Visit (INDEPENDENT_AMBULATORY_CARE_PROVIDER_SITE_OTHER): Payer: Medicaid Other | Admitting: Family Medicine

## 2021-04-07 ENCOUNTER — Telehealth: Payer: Self-pay | Admitting: *Deleted

## 2021-04-07 ENCOUNTER — Encounter: Payer: Self-pay | Admitting: Family Medicine

## 2021-04-07 ENCOUNTER — Other Ambulatory Visit: Payer: Self-pay

## 2021-04-07 DIAGNOSIS — Z3491 Encounter for supervision of normal pregnancy, unspecified, first trimester: Secondary | ICD-10-CM | POA: Diagnosis not present

## 2021-04-07 DIAGNOSIS — O210 Mild hyperemesis gravidarum: Secondary | ICD-10-CM | POA: Diagnosis not present

## 2021-04-07 MED ORDER — DOXYLAMINE-PYRIDOXINE 10-10 MG PO TBEC: 1.0000 | DELAYED_RELEASE_TABLET | Freq: Three times a day (TID) | ORAL | 2 refills | Status: DC

## 2021-04-07 MED ORDER — PRENATE PIXIE 10-0.6-0.4-200 MG PO CAPS: 1.0000 | ORAL_CAPSULE | Freq: Every day | ORAL | 8 refills | Status: DC

## 2021-04-07 NOTE — Progress Notes (Signed)
    SUBJECTIVE:   CHIEF COMPLAINT / HPI:   Morning sickness. LMP=02/03/21  By that date, she is 9w, 0d with EDC = 11/10/21.  Not sure of dates Had nausea and vomiting with previous pregnancies. Tolerating fluids, ginger ale Has some cramping abd pains after vomiting.  None now.   Not on prenatal vit.  Needs Pixie vits, cannot tolerate others. Has appointment for prenatal labs and interview.  Will be followed here  OBJECTIVE:   BP 104/60   Pulse 94   Wt 127 lb (57.6 kg)   LMP 01/21/2021   BMI 25.65 kg/m   Abd benign.  At nine weeks, I did not attempt FHTs.  ASSESSMENT/PLAN:   Morning sickness Will check urine culture, more as a part of routine ob screening  I doubt UTI causing sx. Start diclegis.  Discussed importance of eating something.   No evidence of dehydration.  Normal pregnancy in first trimester Start prenatal vits. Ordered dating ultrasound since she was unsure of LMP.       Moses Manners, MD [facility]

## 2021-04-07 NOTE — Assessment & Plan Note (Signed)
Start prenatal vits. Ordered dating ultrasound since she was unsure of LMP.

## 2021-04-07 NOTE — Telephone Encounter (Signed)
Approved     LMOVM for pharmacy informing of approval.  Jone Baseman, CMA

## 2021-04-07 NOTE — Patient Instructions (Signed)
We will call with culture results. Someone should call to set up the dating ultrasound. I sent in both prenatal vitamins and nausea medicine for you. Keep doing the ginger ale.  You might try ginger cookies. Remember out talk about getting something on your stomach.

## 2021-04-07 NOTE — Telephone Encounter (Signed)
noted 

## 2021-04-07 NOTE — Assessment & Plan Note (Addendum)
Will check urine culture, more as a part of routine ob screening  I doubt UTI causing sx. Start diclegis.  Discussed importance of eating something.   No evidence of dehydration.

## 2021-04-07 NOTE — Telephone Encounter (Signed)
Received fax from pharmacy, PA needed on diclegis.  Clinical questions submitted via Cover My Meds.  Waiting on response, could take up to 72 hours.  Cover My Meds info: Key: [id number]  Jone Baseman, CMA

## 2021-04-09 LAB — CULTURE, OB URINE

## 2021-04-09 LAB — URINE CULTURE, OB REFLEX

## 2021-04-11 ENCOUNTER — Encounter: Payer: Medicaid Other | Admitting: Family Medicine

## 2021-04-11 NOTE — Progress Notes (Deleted)
Patient Name: Leah Walls Date of Birth: February 16, 1986 [facility] Initial Prenatal Visit  Leah Walls is a 35 y.o. year old [id number] at [redacted]w[redacted]d who presents for her initial prenatal visit. Pregnancy {Is/is not:9024} planned She reports {pregnancy symptoms:18128}. She {is/is not:320031::"is"} taking a prenatal vitamin.  She denies pelvic pain or vaginal bleeding.   Pregnancy Dating: . The patient is dated by ***.  . LMP: *** . Period is certain:  {yes/no:20286}.  Marland Kitchen Periods were regular:  {yes/no:20286}.  Marland Kitchen LMP was a typical period:  {yes/no:20286}.  Marland Kitchen Using hormonal contraception in 3 months prior to conception: {yes/no:20286}  Lab Review: . Blood type: O POS . Rh Status: {Rh status :23298::"+"} . Antibody screen: {NEGATIVE/POSITIVE FOR:19998::"Negative"} . HIV: {NEGATIVE/POSITIVE FOR:19998::"Negative"} . RPR: {NEGATIVE/APPRO/POSITIVE FOR:20006::"Negative"} . Hemoglobin electrophoresis reviewed: {yes/no:20286::"Yes"} . Results of OB urine culture are: {NEGATIVE/POSITIVE FOR:19998::"Negative"} . Rubella: {Desc; immune/not/unknown:31571::"Immune"} . Hep C Ab: {NEGATIVE/POSITIVE FOR:19998::"Negative"} . Varicella status is {Desc; immune/not/unknown:31571::"Immune"}  PMH: Reviewed and as detailed below: . HTN: {yes/no:20286::"No"}  . Gestational Hypertension/preeclampsia: {yes/no:20286::"No"}  . Type 1 or 2 Diabetes: {yes/no:20286::"No"}  . Depression:  {yes/no:20286::"No"}  . Seizure disorder:  {yes/no:20286::"No"} . VTE: {yes/no:20286::"No"} ,  . History of STI {yes/no:20286::"No"},  . Abnormal Pap smear:  {yes/no:20286::"No"}, . Genital herpes simplex:  {yes/no:20286::"No"}   PSH: . Gynecologic Surgery:  {No/  **:31982:o:"no"} . Surgical history reviewed, notable for: ***  Obstetric History: . Obstetric history tab updated and reviewed.  . Summary of prior pregnancies: *** . Cesarean delivery: {yes/no:20286::"No"}  . Gestational Diabetes:   {yes/no:20286::"No"} . Hypertension in pregnancy: {yes/no:20286::"No"} . History of preterm birth: {yes/no:20286::"No"} . History of LGA/SGA infant:  {yes/no:20286::"No"} . History of shoulder dystocia: {yes/no:20286::"No"} . Indications for referral were reviewed, and the patient has no obstetric indications for referral to High Risk OB Clinic at this time.   Social History: . Partner's name: ***  . Tobacco use: {yes/no:20286::"No"} . Alcohol use:  {yes/no:20286::"No"} . Other substance use:  {yes/no:20286::"No"}  Current Medications:  . ***  . Reviewed and appropriate in pregnancy.   Genetic and Infection Screen: . Flow Sheet Updated {yes/no:20286::"Yes"}  Prenatal Exam: Gen: Well nourished, well developed.  No distress.  Vitals noted. HEENT: Normocephalic, atraumatic.  Neck supple without cervical lymphadenopathy, thyromegaly or thyroid nodules.  Fair dentition. CV: RRR no murmur, gallops or rubs Lungs: CTA B.  Normal respiratory effort without wheezes or rales. Abd: soft, NTND. +BS.  Uterus not appreciated above pelvis. GU: Normal external female genitalia without lesions.  Nl vaginal, well rugated without lesions. No vaginal discharge.  Bimanual exam: No adnexal mass or TTP. No CMT.  Uterus size *** Ext: No clubbing, cyanosis or edema. Psych: Normal grooming and dress.  Not depressed or anxious appearing.  Normal thought content and process without flight of ideas or looseness of associations  Fetal heart tones: {appropriate:23337::"Appropriate"}  Assessment/Plan:  Leah Walls is a 35 y.o. [id number] at [redacted]w[redacted]d who presents to initiate prenatal care. She is doing well.  Current pregnancy issues include ***.  1. Routine prenatal care: Marland Kitchen As dating {ACTION; IS/IS NOT:21021397::"is not"} reliable, a dating ultrasound {HAS HAS NOT:18834::"has"} been ordered. Dating tab updated. . Pre-pregnancy weight updated. Expected weight gain this pregnancy is {weight gain pregnancy  :23296::"25-35 pounds "} . Prenatal labs reviewed, notable for ***. . Indications for referral to [facility] were reviewed and the patient {DOES NOT does:27190::"does not"} meet criteria for referral.  . Medication list reviewed and updated.  . Recommended patient see a dentist for regular care.  Marland Kitchen  Bleeding and pain precautions reviewed. . Importance of prenatal vitamins reviewed.  . Genetic screening offered. Patient opted for: {obgeneticscreen:23414}. . The patient {DOES NOT does:27190::"does not"} have an indication for aspirin therapy beginning at 12-16 weeks. Aspirin {WAS/WAS NOT:4094369893::"was not"}  recommended today.  . The patient {will/will not be:23415} age 73 or over at time of delivery. Referral to genetic counseling {WAS/WAS NOT:4094369893::"was not"} offered today.  . The patient has the following risk factors for preexisting diabetes: {Pre-existing diabetes screening:23343::"Reviewed indications for early 1 hour glucose testing, not indicated "}. An early 1 hour glucose tolerance test {WAS/WAS NOT:4094369893::"was not"} ordered. . Pregnancy Medical Home and PHQ-9 forms completed, problems noted: {yes/no:20286}  2. Pregnancy issues include the following which were addressed today:  . ***   Follow up 4 weeks for next prenatal visit.

## 2021-04-24 ENCOUNTER — Other Ambulatory Visit: Payer: Self-pay

## 2021-04-24 ENCOUNTER — Encounter (HOSPITAL_COMMUNITY): Payer: Self-pay | Admitting: Emergency Medicine

## 2021-04-24 ENCOUNTER — Emergency Department (HOSPITAL_COMMUNITY): Payer: Medicaid Other

## 2021-04-24 ENCOUNTER — Emergency Department (HOSPITAL_COMMUNITY)
Admission: EM | Admit: 2021-04-24 | Discharge: 2021-04-24 | Disposition: A | Discharge status: Home / Self Care | Payer: Medicaid Other | Attending: Emergency Medicine | Admitting: Emergency Medicine

## 2021-04-24 DIAGNOSIS — R0602 Shortness of breath: Secondary | ICD-10-CM | POA: Insufficient documentation

## 2021-04-24 DIAGNOSIS — O219 Vomiting of pregnancy, unspecified: Secondary | ICD-10-CM | POA: Diagnosis not present

## 2021-04-24 DIAGNOSIS — Z20822 Contact with and (suspected) exposure to covid-19: Secondary | ICD-10-CM | POA: Diagnosis not present

## 2021-04-24 DIAGNOSIS — Z3A11 11 weeks gestation of pregnancy: Secondary | ICD-10-CM | POA: Insufficient documentation

## 2021-04-24 DIAGNOSIS — Z79899 Other long term (current) drug therapy: Secondary | ICD-10-CM | POA: Insufficient documentation

## 2021-04-24 DIAGNOSIS — F17210 Nicotine dependence, cigarettes, uncomplicated: Secondary | ICD-10-CM | POA: Insufficient documentation

## 2021-04-24 DIAGNOSIS — R059 Cough, unspecified: Secondary | ICD-10-CM | POA: Diagnosis not present

## 2021-04-24 DIAGNOSIS — R6883 Chills (without fever): Secondary | ICD-10-CM | POA: Diagnosis not present

## 2021-04-24 DIAGNOSIS — O99331 Smoking (tobacco) complicating pregnancy, first trimester: Secondary | ICD-10-CM | POA: Insufficient documentation

## 2021-04-24 DIAGNOSIS — O99351 Diseases of the nervous system complicating pregnancy, first trimester: Secondary | ICD-10-CM | POA: Diagnosis not present

## 2021-04-24 DIAGNOSIS — O98511 Other viral diseases complicating pregnancy, first trimester: Secondary | ICD-10-CM | POA: Diagnosis not present

## 2021-04-24 DIAGNOSIS — B349 Viral infection, unspecified: Secondary | ICD-10-CM | POA: Insufficient documentation

## 2021-04-24 DIAGNOSIS — R519 Headache, unspecified: Secondary | ICD-10-CM | POA: Diagnosis not present

## 2021-04-24 LAB — CBC
HCT: 34.7 % — ABNORMAL LOW (ref 36.0–46.0)
Hemoglobin: 10.8 g/dL — ABNORMAL LOW (ref 12.0–15.0)
MCH: 28.1 pg (ref 26.0–34.0)
MCHC: 31.1 g/dL (ref 30.0–36.0)
MCV: 90.4 fL (ref 80.0–100.0)
Platelets: 270 10*3/uL (ref 150–400)
RBC: 3.84 MIL/uL — ABNORMAL LOW (ref 3.87–5.11)
RDW: 13.4 % (ref 11.5–15.5)
WBC: 7.6 10*3/uL (ref 4.0–10.5)
nRBC: 0 % (ref 0.0–0.2)

## 2021-04-24 LAB — RESP PANEL BY RT-PCR (FLU A&B, COVID) ARPGX2
Influenza A by PCR: NEGATIVE
Influenza B by PCR: NEGATIVE
SARS Coronavirus 2 by RT PCR: NEGATIVE

## 2021-04-24 LAB — COMPREHENSIVE METABOLIC PANEL
ALT: 9 U/L (ref 0–44)
AST: 14 U/L — ABNORMAL LOW (ref 15–41)
Albumin: 3.7 g/dL (ref 3.5–5.0)
Alkaline Phosphatase: 35 U/L — ABNORMAL LOW (ref 38–126)
Anion gap: 4 — ABNORMAL LOW (ref 5–15)
BUN: 6 mg/dL (ref 6–20)
CO2: 23 mmol/L (ref 22–32)
Calcium: 9.1 mg/dL (ref 8.9–10.3)
Chloride: 105 mmol/L (ref 98–111)
Creatinine, Ser: 0.68 mg/dL (ref 0.44–1.00)
GFR, Estimated: >60 mL/min (ref >60)
Glucose, Bld: 94 mg/dL (ref 70–99)
Potassium: 3.7 mmol/L (ref 3.5–5.1)
Sodium: 132 mmol/L — ABNORMAL LOW (ref 135–145)
Total Bilirubin: 0.3 mg/dL (ref 0.3–1.2)
Total Protein: 6.8 g/dL (ref 6.5–8.1)

## 2021-04-24 LAB — LIPASE, BLOOD: Lipase: 25 U/L (ref 11–51)

## 2021-04-24 LAB — URINALYSIS, ROUTINE W REFLEX MICROSCOPIC
Bilirubin Urine: NEGATIVE
Glucose, UA: NEGATIVE mg/dL
Hgb urine dipstick: NEGATIVE
Ketones, ur: NEGATIVE mg/dL
Leukocytes,Ua: NEGATIVE
Nitrite: NEGATIVE
Protein, ur: 100 mg/dL — AB
Specific Gravity, Urine: 1.018 (ref 1.005–1.030)
Squamous Epithelial / HPF: >50 — ABNORMAL HIGH (ref 0–5)
pH: 7 (ref 5.0–8.0)

## 2021-04-24 MED ORDER — LACTATED RINGERS IV BOLUS
1000.0000 mL | Freq: Once | INTRAVENOUS | Status: AC
  Administered 2021-04-24: 1000 mL via INTRAVENOUS

## 2021-04-24 MED ORDER — ACETAMINOPHEN 500 MG PO TABS
1000.0000 mg | ORAL_TABLET | Freq: Once | ORAL | Status: AC
  Administered 2021-04-24: 1000 mg via ORAL
  Filled 2021-04-24: qty 2

## 2021-04-24 MED ORDER — ALBUTEROL SULFATE HFA 108 (90 BASE) MCG/ACT IN AERS
4.0000 | INHALATION_SPRAY | Freq: Once | RESPIRATORY_TRACT | Status: AC
  Administered 2021-04-24: 4 via RESPIRATORY_TRACT
  Filled 2021-04-24: qty 6.7

## 2021-04-24 MED ORDER — ONDANSETRON HCL 4 MG/2ML IJ SOLN
4.0000 mg | Freq: Once | INTRAMUSCULAR | Status: AC
  Administered 2021-04-24: 4 mg via INTRAVENOUS
  Filled 2021-04-24: qty 2

## 2021-04-24 NOTE — ED Provider Notes (Signed)
[facility] EMERGENCY DEPARTMENT Provider Note   CSN: [id number] Arrival date & time: 04/24/21  1604     History No chief complaint on file.   Leah Walls is a 35 y.o. female.  HPI 35 year old female [redacted] weeks pregnant presents emergency department for fever, chills, body aches, nausea, vomiting, congestion, cough, headaches, and shortness of breath.  This started last night.  Has been constant and worsening.  Has not taken anything for the symptoms.  Nothing makes it better, nothing makes them worse.  Does have sick contacts of partner and 2 children with similar illness.  States that her symptoms are severe.    Past Medical History:  Diagnosis Date  . GERD (gastroesophageal reflux disease)   . Gestational diabetes   . Headache   . Hernia, abdominal 2011  . Nexplanon removal 10/23/2019  . Supervision of other normal pregnancy, antepartum 05/17/2018    Nursing Staff Provider Office Location  [facility] Dating   Language  English Anatomy US  Normal female Flu Vaccine  Declined  Genetic Screen   NIPS: low risk  TDaP vaccine   08/27/18 Hgb A1C or  GTT Early  Third trimester: GDM  Rhogam  n/a   LAB RESULTS  Feeding Plan Breast Blood Type O/Positive/-- (05/31 1059) O pos Contraception nexplanon Antibody Negative (05/31 1059)neg Circumcision Female  Rub    Patient Active Problem List   Diagnosis Date Noted  . Normal pregnancy in first trimester 04/07/2021  . Morning sickness 04/07/2021  . Benign head tremor 07/13/2020  . Tobacco abuse 06/01/2020  . Wheezing 06/01/2020  . Gastroesophageal reflux disease without esophagitis 09/19/2019  . History of gestational diabetes 08/29/2018  . Umbilical hernia 11/15/2016  . Genital herpes 10/18/2016  . Marijuana use 07/30/2016  . Breast lump on right side at 1 o'clock position 07/24/2016    Past Surgical History:  Procedure Laterality Date  . CESAREAN SECTION    . DILATION AND EVACUATION Bilateral 07/14/2015   Procedure:  DILATATION AND EVACUATION;  Surgeon: Kathreen Cosier, MD;  Location: [facility];  Service: Gynecology;  Laterality: Bilateral;  . FOREARM FRACTURE SURGERY Right 2008  . WISDOM TOOTH EXTRACTION       OB History    Gravida  8   Para  7   Term  6   Preterm  1   AB  0   Living  7     SAB  0   IAB  0   Ectopic  0   Multiple  0   Live Births  7           Family History  Problem Relation Age of Onset  . Hypertension Mother   . Hypertension Maternal Grandmother   . Hypertension Paternal Grandmother   . Hypertension Father   . Colon cancer Neg Hx   . Esophageal cancer Neg Hx   . Rectal cancer Neg Hx     Social History   Tobacco Use  . Smoking status: Current Some Day Smoker    Packs/day: 0.50    Types: Cigarettes  . Smokeless tobacco: Never Used  Vaping Use  . Vaping Use: Never used  Substance Use Topics  . Alcohol use: No  . Drug use: No    Home Medications Prior to Admission medications   Medication Sig Start Date End Date Taking? Authorizing Provider  albuterol (VENTOLIN HFA) 108 (90 Base) MCG/ACT inhaler Inhale 2 puffs into the lungs every 6 (six) hours as needed for wheezing  or shortness of breath. 11/15/20   Doreene ElandEniola, Kehinde T, MD  Doxylamine-Pyridoxine 10-10 MG TBEC Take 1 tablet by mouth 3 (three) times daily. 04/07/21   Moses MannersHensel, William A, MD  naproxen (NAPROSYN) 500 MG tablet Take 1 tablet (500 mg total) by mouth 2 (two) times daily. 09/05/20   Georgetta HaberBurky, Natalie B, NP  omeprazole (PRILOSEC) 40 MG capsule TAKE 1 CAPSULE BY MOUTH EVERY DAY 09/29/20   Moses MannersHensel, William A, MD  Prenat-FeAsp-Meth-FA-DHA w/o A (PRENATE PIXIE) 10-0.6-0.4-200 MG CAPS Take 1 tablet by mouth daily. 04/07/21   Moses MannersHensel, William A, MD  amLODipine (NORVASC) 5 MG tablet Take 1 tablet (5 mg total) by mouth daily. 12/05/18 08/22/19  Rasch, Victorino DikeJennifer I, NP  promethazine (PHENERGAN) 25 MG tablet Take 1 tablet (25 mg total) by mouth every 6 (six) hours as needed for nausea or vomiting. Patient not  taking: Reported on 12/05/2018 10/25/18 08/22/19  Tereso NewcomerAnyanwu, Ugonna A, MD    Allergies    Patient has no known allergies.  Review of Systems   Review of Systems  Constitutional: Positive for chills and fever.  HENT: Positive for sore throat. Negative for ear pain.   Eyes: Negative for pain and visual disturbance.  Respiratory: Positive for cough and shortness of breath.   Cardiovascular: Negative for chest pain and palpitations.  Gastrointestinal: Positive for nausea and vomiting. Negative for abdominal pain.  Genitourinary: Negative for dysuria and hematuria.  Musculoskeletal: Positive for myalgias. Negative for arthralgias and back pain.  Skin: Negative for color change and rash.  Neurological: Positive for headaches. Negative for seizures and syncope.  All other systems reviewed and are negative.   Physical Exam Updated Vital Signs BP 96/69   Pulse 97   Temp 99 F (37.2 C) (Oral)   Resp 18   Ht 4\' 11"  (1.499 m)   Wt 57.2 kg   LMP 01/21/2021   SpO2 100%   BMI 25.45 kg/m   Physical Exam Vitals and nursing note reviewed.  Constitutional:      General: She is not in acute distress.    Appearance: She is well-developed. She is ill-appearing.  HENT:     Head: Normocephalic and atraumatic.     Right Ear: External ear normal.     Left Ear: External ear normal.     Nose: Nose normal.     Mouth/Throat:     Mouth: Mucous membranes are moist.  Eyes:     Extraocular Movements: Extraocular movements intact.     Conjunctiva/sclera: Conjunctivae normal.  Cardiovascular:     Rate and Rhythm: Normal rate and regular rhythm.     Heart sounds: Normal heart sounds. No murmur heard.   Pulmonary:     Effort: Pulmonary effort is normal. No respiratory distress.     Breath sounds: Normal breath sounds.  Abdominal:     General: Abdomen is flat.     Palpations: Abdomen is soft.     Tenderness: There is abdominal tenderness (suprapubic). There is no guarding or rebound.   Musculoskeletal:        General: Normal range of motion.     Cervical back: Normal range of motion and neck supple. No rigidity.     Right lower leg: No edema.     Left lower leg: No edema.  Skin:    General: Skin is warm and dry.     Capillary Refill: Capillary refill takes less than 2 seconds.  Neurological:     General: No focal deficit present.  Mental Status: She is alert and oriented to person, place, and time.     Cranial Nerves: No cranial nerve deficit.     Sensory: No sensory deficit.     Motor: No weakness.  Psychiatric:        Mood and Affect: Mood normal.        Behavior: Behavior normal.     ED Results / Procedures / Treatments   Labs (all labs ordered are listed, but only abnormal results are displayed) Labs Reviewed  COMPREHENSIVE METABOLIC PANEL - Abnormal; Notable for the following components:      Result Value   Sodium 132 (*)    AST 14 (*)    Alkaline Phosphatase 35 (*)    Anion gap 4 (*)    All other components within normal limits  CBC - Abnormal; Notable for the following components:   RBC 3.84 (*)    Hemoglobin 10.8 (*)    HCT 34.7 (*)    All other components within normal limits  URINALYSIS, ROUTINE W REFLEX MICROSCOPIC - Abnormal; Notable for the following components:   APPearance CLOUDY (*)    Protein, ur 100 (*)    Bacteria, UA MANY (*)    Squamous Epithelial / LPF >50 (*)    All other components within normal limits  RESP PANEL BY RT-PCR (FLU A&B, COVID) [id number]  LIPASE, BLOOD    EKG None  Radiology DG Chest Portable 1 View  Result Date: 04/24/2021 CLINICAL DATA:  Cough and chills. EXAM: PORTABLE CHEST 1 VIEW COMPARISON:  None. FINDINGS: Normal sized heart. Clear lungs. Minimal peribronchial thickening. Unremarkable bones. IMPRESSION: Minimal bronchitic changes. Electronically Signed   By: Beckie Salts M.D.   On: 04/24/2021 19:15    Procedures Procedures  EMERGENCY DEPARTMENT [place] PREGNANCY "Study: Limited Ultrasound of the  Pelvis for Pregnancy"  INDICATIONS:Pregnancy(required) Multiple views of the uterus and pelvic cavity were obtained in real-time with a multi-frequency probe.  APPROACH:Transabdominal  PERFORMED BY: Myself IMAGES ARCHIVED?: Yes LIMITATIONS: none PREGNANCY FREE FLUID: Present, minimal ADNEXAL FINDINGS:Left ovary not seen and Right ovary not seen GESTATIONAL AGE, ESTIMATE: [redacted]w[redacted]d FETAL HEART RATE: 183 INTERPRETATION: Intrauterine gestational sac noted, Fetal pole present and Fetal heart activity seen     Medications Ordered in ED Medications  acetaminophen (TYLENOL) tablet 1,000 mg (1,000 mg Oral Given 04/24/21 1843)  lactated ringers bolus 1,000 mL (0 mLs Intravenous Stopped 04/24/21 1948)  ondansetron (ZOFRAN) injection 4 mg (4 mg Intravenous Given 04/24/21 1842)  albuterol (VENTOLIN HFA) 108 (90 Base) MCG/ACT inhaler 4 puff (4 puffs Inhalation Given 04/24/21 1948)    ED Course  I have reviewed the triage vital signs and the nursing notes.  Pertinent labs & imaging results that were available during my care of the patient were reviewed by me and considered in my medical decision making (see chart for details).    MDM Rules/Calculators/A&P                          35 year old female presents for cough, fever, shortness of breath, headaches, nausea, vomiting.  Vital signs are stable, patient is not in acute distress.  Exam shows female who does appear to not feel well, not in acute distress.  Lungs are clear to auscultation.  Abdomen is soft.  Has very mild suprapubic tenderness rebound or guarding.  No focal neurologic deficits.  Presentation most consistent with viral syndrome, especially in light of people at home having the same symptoms at home.  Less consistent with bacterial pneumonia as she has no sputum production, is not hypoxic.  Regarding the abdominal tenderness, very low suspicion for ectopic pregnancy or acute abdominal emergency.  Abdomen is very soft.  She states she has  been throwing up, and tenderness is more consistent with post vomiting abdominal pain.  Partner confirms that he had similar abdominal pain during this illness.  Will obtain basic screening labs and COVID/flu.  Additionally, will address symptoms with fluids, Tylenol, and Zofran.  Urinalysis looks contaminated with many squamous epithelial cells and some bacteria.  No leukocytes or nitrites.  As she is asymptomatic and has so many squamous epithelial cells, believe bacteria is contaminant.  Will not treat at this time.  Bedside ultrasound showed intrauterine pregnancy, around 11 weeks 1 day.  Fetal heart rate in the 180s.  Upon reevaluation, patient felt better.  Did have some more shortness of breath.  Given albuterol inhaler with improvement.  Presentation most consistent with viral syndrome.  Patient feels comfortable going home.  We discussed symptomatic management return precautions.  Recommended close PCP follow-up.  Patient was discharged stable condition.  Final Clinical Impression(s) / ED Diagnoses Final diagnoses:  Viral illness    Rx / DC Orders ED Discharge Orders    None       Louretta Parma, DO 04/24/21 2336    Tilden Fossa, MD 04/25/21 1147

## 2021-04-24 NOTE — ED Triage Notes (Signed)
C/o generalized body aches, nausea, vomiting, headache, and congestion since last night.  Negative home COVID test.  [redacted] weeks pregnant

## 2021-04-24 NOTE — ED Notes (Signed)
Patient verbalizes understanding of discharge instructions. Opportunity for questioning and answers were provided. Armband removed by staff, pt discharged from ED ambulatory.   

## 2021-04-24 NOTE — Discharge Instructions (Signed)
Take Tylenol as needed for fever, do not take ibuprofen while pregnant. Follow-up closely with your normal doctor. Come back to the emergency department for chest pain, shortness of breath, weakness, numbness, or any other concerning medical condition.  Thank you for allowing [place] to take care of you today, we hope you feel better soon.

## 2021-04-25 ENCOUNTER — Telehealth: Payer: Self-pay

## 2021-04-25 NOTE — Telephone Encounter (Signed)
Transition Care Management Follow-up Telephone Call  Date of discharge and from where: 04/24/2021 from [facility]  How have you been since you were released from the hospital? Pt stated that she is still poorly, pt stated that she had a headache still and some body aches.   Any questions or concerns? No  Items Reviewed:  Did the pt receive and understand the discharge instructions provided? Yes   Medications obtained and verified? Yes   Other? No   Any new allergies since your discharge? No   Dietary orders reviewed? n/a  Do you have support at home? Yes   Functional Questionnaire: (I = Independent and D = Dependent) ADLs: I  Bathing/Dressing- I  Meal Prep- I  Eating- I  Maintaining continence- I  Transferring/Ambulation- I  Managing Meds- I   Follow up appointments reviewed:   PCP Hospital f/u appt confirmed? No  Pt advised to follow up with PCP if symptoms do not improve. Marland Kitchen  Specialist Hospital f/u appt confirmed? No    Are transportation arrangements needed? No   If their condition worsens, is the pt aware to call PCP or go to the Emergency Dept.? Yes  Was the patient provided with contact information for the PCP's office or ED? Yes  Was to pt encouraged to call back with questions or concerns? Yes

## 2021-04-25 NOTE — Telephone Encounter (Signed)
Patient calls nurse line regarding viral infection. Patient currently reports headache, chills, chest tightness. Patient was seen in MAU yesterday for same concern. Negative COVID and flu.   Advised patient of supportive measures for symptoms. Patient reports that tylenol has not been helping and is requesting additional medication symptoms. Precepted with Dr. Deirdre Priest. Advised patient of safe OTC medications for use in pregnancy, use of humidifier, Vicks vapor rub and cough drops. Encouraged increased PO intake. Scheduled patient appointment for tomorrow afternoon for follow up.   ED precautions given.   Veronda Prude, RN

## 2021-04-26 ENCOUNTER — Inpatient Hospital Stay (HOSPITAL_COMMUNITY): Payer: Medicaid Other

## 2021-04-26 ENCOUNTER — Inpatient Hospital Stay (HOSPITAL_COMMUNITY)
Admission: AD | Admit: 2021-04-26 | Discharge: 2021-04-26 | Disposition: A | Discharge status: Home / Self Care | Payer: Medicaid Other | Attending: Family Medicine | Admitting: Family Medicine

## 2021-04-26 ENCOUNTER — Encounter (HOSPITAL_COMMUNITY): Payer: Self-pay | Admitting: Family Medicine

## 2021-04-26 ENCOUNTER — Other Ambulatory Visit: Payer: Self-pay

## 2021-04-26 ENCOUNTER — Ambulatory Visit: Payer: Medicaid Other

## 2021-04-26 DIAGNOSIS — Z791 Long term (current) use of non-steroidal anti-inflammatories (NSAID): Secondary | ICD-10-CM | POA: Insufficient documentation

## 2021-04-26 DIAGNOSIS — R519 Headache, unspecified: Secondary | ICD-10-CM | POA: Diagnosis not present

## 2021-04-26 DIAGNOSIS — F17210 Nicotine dependence, cigarettes, uncomplicated: Secondary | ICD-10-CM | POA: Insufficient documentation

## 2021-04-26 DIAGNOSIS — O99511 Diseases of the respiratory system complicating pregnancy, first trimester: Secondary | ICD-10-CM | POA: Diagnosis not present

## 2021-04-26 DIAGNOSIS — O99331 Smoking (tobacco) complicating pregnancy, first trimester: Secondary | ICD-10-CM | POA: Diagnosis not present

## 2021-04-26 DIAGNOSIS — G4486 Cervicogenic headache: Secondary | ICD-10-CM | POA: Diagnosis not present

## 2021-04-26 DIAGNOSIS — K219 Gastro-esophageal reflux disease without esophagitis: Secondary | ICD-10-CM | POA: Insufficient documentation

## 2021-04-26 DIAGNOSIS — O26891 Other specified pregnancy related conditions, first trimester: Secondary | ICD-10-CM | POA: Insufficient documentation

## 2021-04-26 DIAGNOSIS — J101 Influenza due to other identified influenza virus with other respiratory manifestations: Secondary | ICD-10-CM | POA: Diagnosis not present

## 2021-04-26 DIAGNOSIS — O0941 Supervision of pregnancy with grand multiparity, first trimester: Secondary | ICD-10-CM | POA: Insufficient documentation

## 2021-04-26 DIAGNOSIS — R112 Nausea with vomiting, unspecified: Secondary | ICD-10-CM | POA: Diagnosis not present

## 2021-04-26 DIAGNOSIS — O99611 Diseases of the digestive system complicating pregnancy, first trimester: Secondary | ICD-10-CM | POA: Insufficient documentation

## 2021-04-26 DIAGNOSIS — O99351 Diseases of the nervous system complicating pregnancy, first trimester: Secondary | ICD-10-CM | POA: Insufficient documentation

## 2021-04-26 DIAGNOSIS — O09521 Supervision of elderly multigravida, first trimester: Secondary | ICD-10-CM | POA: Diagnosis not present

## 2021-04-26 DIAGNOSIS — Z20822 Contact with and (suspected) exposure to covid-19: Secondary | ICD-10-CM | POA: Diagnosis not present

## 2021-04-26 DIAGNOSIS — Z3A13 13 weeks gestation of pregnancy: Secondary | ICD-10-CM | POA: Diagnosis not present

## 2021-04-26 DIAGNOSIS — Z79899 Other long term (current) drug therapy: Secondary | ICD-10-CM | POA: Diagnosis not present

## 2021-04-26 DIAGNOSIS — O219 Vomiting of pregnancy, unspecified: Secondary | ICD-10-CM | POA: Diagnosis not present

## 2021-04-26 DIAGNOSIS — H538 Other visual disturbances: Secondary | ICD-10-CM | POA: Diagnosis not present

## 2021-04-26 LAB — COMPREHENSIVE METABOLIC PANEL
ALT: 10 U/L (ref 0–44)
AST: 19 U/L (ref 15–41)
Albumin: 3.7 g/dL (ref 3.5–5.0)
Alkaline Phosphatase: 43 U/L (ref 38–126)
Anion gap: 7 (ref 5–15)
BUN: 6 mg/dL (ref 6–20)
CO2: 21 mmol/L — ABNORMAL LOW (ref 22–32)
Calcium: 9.3 mg/dL (ref 8.9–10.3)
Chloride: 104 mmol/L (ref 98–111)
Creatinine, Ser: 0.7 mg/dL (ref 0.44–1.00)
GFR, Estimated: >60 mL/min (ref >60)
Glucose, Bld: 102 mg/dL — ABNORMAL HIGH (ref 70–99)
Potassium: 3.8 mmol/L (ref 3.5–5.1)
Sodium: 132 mmol/L — ABNORMAL LOW (ref 135–145)
Total Bilirubin: 0.1 mg/dL — ABNORMAL LOW (ref 0.3–1.2)
Total Protein: 7.2 g/dL (ref 6.5–8.1)

## 2021-04-26 LAB — CBC
HCT: 34.5 % — ABNORMAL LOW (ref 36.0–46.0)
Hemoglobin: 11.1 g/dL — ABNORMAL LOW (ref 12.0–15.0)
MCH: 28.9 pg (ref 26.0–34.0)
MCHC: 32.2 g/dL (ref 30.0–36.0)
MCV: 89.8 fL (ref 80.0–100.0)
Platelets: 233 10*3/uL (ref 150–400)
RBC: 3.84 MIL/uL — ABNORMAL LOW (ref 3.87–5.11)
RDW: 13.5 % (ref 11.5–15.5)
WBC: 5.3 10*3/uL (ref 4.0–10.5)
nRBC: 0 % (ref 0.0–0.2)

## 2021-04-26 LAB — RAPID URINE DRUG SCREEN, HOSP PERFORMED
Amphetamines: NOT DETECTED
Barbiturates: NOT DETECTED
Benzodiazepines: NOT DETECTED
Cocaine: NOT DETECTED
Opiates: NOT DETECTED
Tetrahydrocannabinol: POSITIVE — AB

## 2021-04-26 LAB — RESP PANEL BY RT-PCR (FLU A&B, COVID) ARPGX2
Influenza A by PCR: POSITIVE — AB
Influenza B by PCR: NEGATIVE
SARS Coronavirus 2 by RT PCR: NEGATIVE

## 2021-04-26 MED ORDER — DIPHENHYDRAMINE HCL 50 MG/ML IJ SOLN
25.0000 mg | Freq: Once | INTRAMUSCULAR | Status: AC
  Administered 2021-04-26: 25 mg via INTRAVENOUS
  Filled 2021-04-26: qty 1

## 2021-04-26 MED ORDER — SODIUM CHLORIDE 0.9 % IV SOLN: INTRAVENOUS | Status: DC

## 2021-04-26 MED ORDER — OSELTAMIVIR PHOSPHATE 75 MG PO CAPS: 75.0000 mg | ORAL_CAPSULE | Freq: Two times a day (BID) | ORAL | 0 refills | Status: AC

## 2021-04-26 MED ORDER — DEXAMETHASONE SODIUM PHOSPHATE 10 MG/ML IJ SOLN
10.0000 mg | Freq: Once | INTRAMUSCULAR | Status: AC
  Administered 2021-04-26: 10 mg via INTRAVENOUS
  Filled 2021-04-26: qty 1

## 2021-04-26 MED ORDER — MORPHINE SULFATE (PF) 4 MG/ML IV SOLN
2.0000 mg | Freq: Once | INTRAVENOUS | Status: AC
  Administered 2021-04-26: 2 mg via INTRAVENOUS
  Filled 2021-04-26: qty 1

## 2021-04-26 MED ORDER — PROCHLORPERAZINE EDISYLATE 10 MG/2ML IJ SOLN
10.0000 mg | Freq: Once | INTRAMUSCULAR | Status: AC
  Administered 2021-04-26: 10 mg via INTRAVENOUS
  Filled 2021-04-26: qty 2

## 2021-04-26 MED ORDER — LACTATED RINGERS IV BOLUS
1000.0000 mL | Freq: Once | INTRAVENOUS | Status: AC
  Administered 2021-04-26: 1000 mL via INTRAVENOUS
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
## 2021-04-26 NOTE — Discharge Instructions (Signed)
Safe Medications in Pregnancy    Acne: Benzoyl Peroxide Salicylic Acid  Backache/Headache: Tylenol: 2 regular strength every 4 hours OR              2 Extra strength every 6 hours  Colds/Coughs/Allergies: Benadryl (alcohol free) 25 mg every 6 hours as needed Breath right strips Claritin Cepacol throat lozenges Chloraseptic throat spray Cold-Eeze- up to three times per day Cough drops, alcohol free Flonase (by prescription only) Guaifenesin Mucinex Robitussin DM (plain only, alcohol free) Saline nasal spray/drops Sudafed (pseudoephedrine) & Actifed ** use only after [redacted] weeks gestation and if you do not have high blood pressure Tylenol Vicks Vaporub Zinc lozenges Zyrtec   Constipation: Colace Ducolax suppositories Fleet enema Glycerin suppositories Metamucil Milk of magnesia Miralax Senokot Smooth move tea  Diarrhea: Kaopectate Imodium A-D  *NO pepto Bismol  Hemorrhoids: Anusol Anusol HC Preparation H Tucks  Indigestion: Tums Maalox Mylanta Zantac  Pepcid  Insomnia: Benadryl (alcohol free) 25mg  every 6 hours as needed Tylenol PM Unisom, no Gelcaps  Leg Cramps: Tums MagGel  Nausea/Vomiting:  Bonine Dramamine Emetrol Ginger extract Sea bands Meclizine  Nausea medication to take during pregnancy:  Unisom (doxylamine succinate 25 mg tablets) Take one tablet daily at bedtime. If symptoms are not adequately controlled, the dose can be increased to a maximum recommended dose of two tablets daily (1/2 tablet in the morning, 1/2 tablet mid-afternoon and one at bedtime). Vitamin B6 100mg  tablets. Take one tablet twice a day (up to 200 mg per day).  Skin Rashes: Aveeno products Benadryl cream or 25mg  every 6 hours as needed Calamine Lotion 1% cortisone cream  Yeast infection: Gyne-lotrimin 7 Monistat 7   **If taking multiple medications, please check labels to avoid duplicating the same active ingredients **take  medication as directed on the label ** Do not exceed 4000 mg of tylenol in 24 hours **Do not take medications that contain aspirin or ibuprofen            Pregnancy and Influenza Influenza, also called the flu, is an infection of the lungs and airways (respiratory tract). If you are pregnant, you are more likely to catch the flu. You are also more likely to have serious illness from the flu. This is because pregnancy causes changes to your body's disease-fighting system (immune system), heart, and lungs. If you develop serious illness from the flu, this can cause problems for you and your developing baby. How do people get the flu? The flu is caused by a type of germ called a virus. It spreads when virus particles get passed from person to person by:  Being near a sick person who is coughing or sneezing.  Touching something that has the virus on it and then touching your mouth, nose, or face. The influenza virus is most common during the fall and winter. What actions can I take to protect myself against the flu?  Get a flu shot. The best way to prevent the flu is to get a flu shot before flu season starts. The flu shot is not dangerous for your developing baby. It may even help protect your baby from the flu for up to 6 months after birth.  Wash your hands often with soap and warm water for at least 20 seconds. If soap and water are not available, use alcohol-based hand sanitizer.  Do not come in close contact with sick people.  Do not share food, drinks, or utensils with other people.  Avoid touching your eyes, nose, and mouth.  Clean frequently used surfaces at home, school, or work.  Practice healthy lifestyle habits, such as: ? Eating a healthy, balanced diet. ? Drinking plenty of fluids. ? Exercising regularly or as told by your health care provider. ? Sleeping 7-9 hours each night. ? Finding ways to manage stress.   What should I do if I have flu symptoms?  If you  have any symptoms of the flu, even after getting a flu shot, contact your health care provider right away.  To reduce fever, take over-the-counter acetaminophen as told by your health care provider.  If you have the flu, your health care provider may give you antiviral medicine to keep the flu from becoming severe and to shorten how long it lasts.  Avoid spreading the flu to others: ? Stay home until you are well. ? Cover your nose and mouth when you cough or sneeze. ? Wash your hands often.   Follow these instructions at home:  Take over-the-counter and prescription medicines only as told by your health care provider. Do not take any medicine, including cold or flu medicine, unless your health care provider tells you to do so.  If you were prescribed antiviral medicine, take it as told by your health care provider. Do not stop taking the antiviral medicine even if you start to feel better.  Eat a nutrient-rich diet that includes fresh fruits and vegetables, whole grains, lean protein, and low-fat dairy.  Drink enough fluid to keep your urine clear or pale yellow.  Get plenty of rest.  Keep all follow-up visits. This is important. Contact a health care provider if:  You have a fever or chills.  You have a cough, sore throat, or stuffy nose.  You have worsening or unusual muscle aches, headache, tiredness, or loss of appetite.  You have vomiting or diarrhea. Get help right away if:  You have trouble breathing.  You have chest pain.  You have abdominal pain.  You begin to have labor pains.  You do not feel your baby move.  You have diarrhea or vomiting that will not go away.  You have dizziness or confusion.  Your symptoms do not improve, even with treatment. These symptoms may represent a serious problem that is an emergency. Do not wait to see if the symptoms will go away. Get medical help right away. Call your local emergency services (911 in the [place] Do not drive  yourself to the hospital. Summary  If you are pregnant, you are more likely to catch the flu. You are also more likely to have a more serious case of the flu.  If you have flu-like symptoms, call your health care provider right away. If you develop serious illness from the flu, this can cause problems for you and your developing baby.  The best way to prevent the flu is to get a flu shot before flu season starts. The flu shot is safe during pregnancy and not dangerous for your developing baby.  If you have the flu and were prescribed antiviral medicine, take it as told by your health care provider. This information is not intended to replace advice given to you by your health care provider. Make sure you discuss any questions you have with your health care provider. Document Revised: 07/24/2020 Document Reviewed: 07/24/2020 Elsevier Patient Education  2021 [employer].
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
## 2021-05-02 ENCOUNTER — Ambulatory Visit
Admission: RE | Admit: 2021-05-02 | Discharge: 2021-05-02 | Disposition: A | Discharge status: Home / Self Care | Payer: Medicaid Other | Source: Ambulatory Visit | Attending: Family Medicine | Admitting: Family Medicine

## 2021-05-02 ENCOUNTER — Other Ambulatory Visit: Payer: Self-pay

## 2021-05-02 DIAGNOSIS — Z3A12 12 weeks gestation of pregnancy: Secondary | ICD-10-CM | POA: Diagnosis not present

## 2021-05-02 DIAGNOSIS — Z3491 Encounter for supervision of normal pregnancy, unspecified, first trimester: Secondary | ICD-10-CM | POA: Insufficient documentation

## 2021-05-02 DIAGNOSIS — Z369 Encounter for antenatal screening, unspecified: Secondary | ICD-10-CM | POA: Diagnosis not present

## 2021-05-03 ENCOUNTER — Other Ambulatory Visit (HOSPITAL_COMMUNITY)
Admission: RE | Admit: 2021-05-03 | Discharge: 2021-05-03 | Disposition: A | Discharge status: Home / Self Care | Payer: Medicaid Other | Source: Ambulatory Visit | Attending: Family Medicine | Admitting: Family Medicine

## 2021-05-03 ENCOUNTER — Ambulatory Visit (INDEPENDENT_AMBULATORY_CARE_PROVIDER_SITE_OTHER): Payer: Medicaid Other | Admitting: Family Medicine

## 2021-05-03 ENCOUNTER — Other Ambulatory Visit: Payer: Self-pay

## 2021-05-03 VITALS — BP 98/62 | HR 84 | Wt 120.4 lb

## 2021-05-03 DIAGNOSIS — Z3490 Encounter for supervision of normal pregnancy, unspecified, unspecified trimester: Secondary | ICD-10-CM | POA: Diagnosis not present

## 2021-05-03 DIAGNOSIS — Z3481 Encounter for supervision of other normal pregnancy, first trimester: Secondary | ICD-10-CM | POA: Diagnosis not present

## 2021-05-03 DIAGNOSIS — O0990 Supervision of high risk pregnancy, unspecified, unspecified trimester: Secondary | ICD-10-CM | POA: Insufficient documentation

## 2021-05-03 DIAGNOSIS — Z31430 Encounter of female for testing for genetic disease carrier status for procreative management: Secondary | ICD-10-CM | POA: Diagnosis not present

## 2021-05-03 HISTORY — DX: Supervision of high risk pregnancy, unspecified, unspecified trimester: O09.90

## 2021-05-03 MED ORDER — ASPIRIN EC 81 MG PO TBEC: 81.0000 mg | DELAYED_RELEASE_TABLET | Freq: Every day | ORAL | 8 refills | Status: DC

## 2021-05-03 NOTE — Progress Notes (Signed)
Patient Name: [name] Date of Birth: [birth year] [facility] Initial Prenatal Visit  Leah Walls is a 35 y.o. year old [id number] at [redacted]w[redacted]d who presents for her initial prenatal visit. Pregnancy is not planned but she does wish to keep it. She reports nausea and vomiting with certain foods and often when she goes outside. Diclegis has been helpful. She is taking a prenatal vitamin.  She denies pelvic pain or vaginal bleeding.   Pregnancy Dating: . The patient is dated by 12 week ultrasound.  Marland Kitchen LMP: 02/03/2021 . Period is certain:  No.  . Periods were regular:  Yes.  Marland Kitchen LMP was a typical period:  Yes.  . Using hormonal contraception in 3 months prior to conception: No  Lab Review: . Blood type: O POS . Rh Status: + . Antibody screen: Not done . HIV: Not done . RPR: Not examined . Hemoglobin electrophoresis reviewed: Yes . Results of OB urine culture are: Negative . Rubella: Unknown . Hep C Ab: Not done . Varicella status is Unknown  PMH: Reviewed and as detailed below: . HTN: No  . Gestational Hypertension/preeclampsia: No  . Type 1 or 2 Diabetes: No  . Depression:  No  . Seizure disorder:  No . VTE: No ,  . History of STI Yes, chlamydia many years ago . Abnormal Pap smear:  No, . Genital herpes simplex:  Yes   PSH: . Gynecologic Surgery:  c-section x1 in 2004 . Surgical history reviewed, notable for: c/s as above, wrist surgery  Obstetric History: . Obstetric history tab updated and reviewed.  . Summary of prior pregnancies:  o G1 term c-section due to failure to progress o G2 term VBAC without complication o G3 preterm ([redacted]w[redacted]d) VBAC o G4, G5, G6 term VBACs without complication o G7 term VBAC, pregnancy complicated by A2GDM . Cesarean delivery: Yes  . Gestational Diabetes:  Yes . Hypertension in pregnancy: No . History of preterm birth: Yes . History of LGA/SGA infant:  No . History of shoulder dystocia: No . Indications for referral were  reviewed, and the patient has no obstetric indications for referral to [facility] at this time.   Social History: . Partner's name: Stevie Kern  . Tobacco use: Yes-- states she quit earlier this pregnancy due to nausea/vomiting . Alcohol use:  No . Other substance use:  Patient denies, but UDS + for THC   Current Medications:  . Prenatal vitamin, Diclegis, Omeprazole . Reviewed and appropriate in pregnancy.   Genetic and Infection Screen: . Flow Sheet Updated Yes  Prenatal Exam: Gen: Well nourished, well developed.  No distress.  Vitals noted. HEENT: Normocephalic, atraumatic.  Neck supple.  CV: RRR no murmur, gallops or rubs Lungs: CTA B.  Normal respiratory effort without wheezes or rales. Abd: soft, NTND. +BS.  Uterus not appreciated above pelvis. GU: Normal external female genitalia without lesions.  Nl vaginal, well rugated without lesions. No vaginal discharge.  Ext: No clubbing, cyanosis or edema. Psych: Normal grooming and dress.  Not depressed or anxious appearing.  Normal thought content and process without flight of ideas or looseness of associations  Fetal heart tones: Appropriate  Assessment/Plan:  [name] is a 35 y.o. [id number] at [redacted]w[redacted]d who presents to initiate prenatal care. She is doing well.   1. Routine prenatal care: Marland Kitchen Patient dated by 12 week ultrasound. Dating tab updated. . Pre-pregnancy weight updated. Expected weight gain this pregnancy is 25-35 pounds  . Prenatal labs  reviewed and unremarkable thus far. Majority of prenatal labs obtained today. . Indications for referral to [facility] were reviewed and the patient does not meet criteria for referral. Will refer to [facility] at 32w due to hx of c/s. . Medication list reviewed and updated.   . Bleeding and pain precautions reviewed. . Importance of prenatal vitamins reviewed.  . Genetic screening offered. Patient opted for: cfDNA . The patient does have an indication for aspirin therapy beginning at  12-16 weeks. Aspirin was  recommended today.  . The patient will be age 23 or over at time of delivery. Referral to genetic counseling was offered today.  . The patient has the following risk factors for preexisting diabetes: history of A2GDM . An early 1 hour glucose tolerance test was not ordered due to normal BMI and time constraints at today's visit, but will likely be performed at next visit. . Pregnancy Medical Home and PHQ-9 forms completed, problems noted: None  2. Pregnancy issues include the following which were addressed today:  . Hx of HSV- will need Valtrex suppression . UDS +for THC . Recent illness with Influenza A-- symptoms are nearly resolved   Follow up 4 weeks for next prenatal visit.   Maury Dus, MD PGY-1, [facility]

## 2021-05-03 NOTE — Patient Instructions (Addendum)
Your due date is November 27th!  Keep taking your prenatal vitamin.  Start taking aspirin 81mg  daily. I will call you with the results of your labs if anything is abnormal.  I'll see you again in 4 weeks! We will do the test for gestational diabetes at that time.   First Trimester of Pregnancy  The first trimester of pregnancy starts on the first day of your last menstrual period until the end of week 12. This is months 1 through 3 of pregnancy. A week after a sperm fertilizes an egg, the egg will implant into the wall of the uterus and begin to develop into a baby. By the end of 12 weeks, all the baby's organs will be formed and the baby will be 2-3 inches in size. Body changes during your first trimester Your body goes through many changes during pregnancy. The changes vary and generally return to normal after your baby is born. Physical changes  You may gain or lose weight.  Your breasts may begin to grow larger and become tender. The tissue that surrounds your nipples (areola) may become darker.  Dark spots or blotches (chloasma or mask of pregnancy) may develop on your face.  You may have changes in your hair. These can include thickening or thinning of your hair or changes in texture. Health changes  You may feel nauseous, and you may vomit.  You may have heartburn.  You may develop headaches.  You may develop constipation.  Your gums may bleed and may be sensitive to brushing and flossing. Other changes  You may tire easily.  You may urinate more often.  Your menstrual periods will stop.  You may have a loss of appetite.  You may develop cravings for certain kinds of food.  You may have changes in your emotions from day to day.  You may have more vivid and strange dreams. Follow these instructions at home: Medicines  Follow your health care provider's instructions regarding medicine use. Specific medicines may be either safe or unsafe to take during  pregnancy. Do not take any medicines unless told to by your health care provider.  Take a prenatal vitamin that contains at least 600 micrograms (mcg) of folic acid. Eating and drinking  Eat a healthy diet that includes fresh fruits and vegetables, whole grains, good sources of protein such as meat, eggs, or tofu, and low-fat dairy products.  Avoid raw meat and unpasteurized juice, milk, and cheese. These carry germs that can harm you and your baby.  If you feel nauseous or you vomit: ? Eat 4 or 5 small meals a day instead of 3 large meals. ? Try eating a few soda crackers. ? Drink liquids between meals instead of during meals.  You may need to take these actions to prevent or treat constipation: ? Drink enough fluid to keep your urine pale yellow. ? Eat foods that are high in fiber, such as beans, whole grains, and fresh fruits and vegetables. ? Limit foods that are high in fat and processed sugars, such as fried or sweet foods. Activity  Exercise only as directed by your health care provider. Most people can continue their usual exercise routine during pregnancy. Try to exercise for 30 minutes at least 5 days a week.  Stop exercising if you develop pain or cramping in the lower abdomen or lower back.  Avoid exercising if it is very hot or humid or if you are at high altitude.  Avoid heavy lifting.  If you  choose to, you may have sex unless your health care provider tells you not to. Relieving pain and discomfort  Wear a good support bra to relieve breast tenderness.  Rest with your legs elevated if you have leg cramps or low back pain.  If you develop bulging veins (varicose veins) in your legs: ? Wear support hose as told by your health care provider. ? Elevate your feet for 15 minutes, 3-4 times a day. ? Limit salt in your diet. Safety  Wear your seat belt at all times when driving or riding in a car.  Talk with your health care provider if someone is verbally or  physically abusive to you.  Talk with your health care provider if you are feeling sad or have thoughts of hurting yourself. Lifestyle  Do not use hot tubs, steam rooms, or saunas.  Do not douche. Do not use tampons or scented sanitary pads.  Do not use herbal remedies, alcohol, illegal drugs, or medicines that are not approved by your health care provider. Chemicals in these products can harm your baby.  Do not use any products that contain nicotine or tobacco, such as cigarettes, e-cigarettes, and chewing tobacco. If you need help quitting, ask your health care provider.  Avoid cat litter boxes and soil used by cats. These carry germs that can cause birth defects in the baby and possibly loss of the unborn baby (fetus) by miscarriage or stillbirth. General instructions  During routine prenatal visits in the first trimester, your health care provider will do a physical exam, perform necessary tests, and ask you how things are going. Keep all follow-up visits. This is important.  Ask for help if you have counseling or nutritional needs during pregnancy. Your health care provider can offer advice or refer you to specialists for help with various needs.  Schedule a dentist appointment. At home, brush your teeth with a soft toothbrush. Floss gently.  Write down your questions. Take them to your prenatal visits. Where to find more information  American Pregnancy Association: americanpregnancy.org  [employer]necologists: https://www.todd-brady.net/  Office on [employer] [facility]: MightyReward.co.nz Contact a health care provider if you have:  Dizziness.  A fever.  Mild pelvic cramps, pelvic pressure, or nagging pain in the abdominal area.  Nausea, vomiting, or diarrhea that lasts for 24 hours or longer.  A bad-smelling vaginal discharge.  Pain when you urinate.  Known exposure to a contagious illness, such as chickenpox, measles, Zika  virus, HIV, or hepatitis. Get help right away if you have:  Spotting or bleeding from your vagina.  Severe abdominal cramping or pain.  Shortness of breath or chest pain.  Any kind of trauma, such as from a fall or a car crash.  New or increased pain, swelling, or redness in an arm or leg. Summary  The first trimester of pregnancy starts on the first day of your last menstrual period until the end of week 12 (months 1 through 3).  Eating 4 or 5 small meals a day rather than 3 large meals may help to relieve nausea and vomiting.  Do not use any products that contain nicotine or tobacco, such as cigarettes, e-cigarettes, and chewing tobacco. If you need help quitting, ask your health care provider.  Keep all follow-up visits. This is important. This information is not intended to replace advice given to you by your health care provider. Make sure you discuss any questions you have with your health care provider. Document Revised: 05/12/2020 Document  3).  Eat 4 or 5 small meals a day instead of 3 large meals.  Do not smoke or use any products that contain nicotine or tobacco. If you need help quitting, ask your doctor.  Keep all follow-up visits. This information is not intended to replace advice given to you by your health care provider. Make sure you discuss any questions you have with your health care provider. Document Revised: 05/12/2020 Document Reviewed: 03/18/2020 Elsevier Patient Education  2021 [employer].  

## 2021-05-04 ENCOUNTER — Encounter: Payer: Self-pay | Admitting: Family Medicine

## 2021-05-04 LAB — OBSTETRIC PANEL, INCLUDING HIV
Antibody Screen: NEGATIVE
Basophils Absolute: 0 10*3/uL (ref 0.0–0.2)
Basos: 0 %
EOS (ABSOLUTE): 0.1 10*3/uL (ref 0.0–0.4)
Eos: 2 %
HIV Screen 4th Generation wRfx: NONREACTIVE
Hematocrit: 33 % — ABNORMAL LOW (ref 34.0–46.6)
Hemoglobin: 11 g/dL — ABNORMAL LOW (ref 11.1–15.9)
Hepatitis B Surface Ag: NEGATIVE
Immature Grans (Abs): 0.1 10*3/uL (ref 0.0–0.1)
Immature Granulocytes: 1 %
Lymphocytes Absolute: 1.9 10*3/uL (ref 0.7–3.1)
Lymphs: 30 %
MCH: 28.6 pg (ref 26.6–33.0)
MCHC: 33.3 g/dL (ref 31.5–35.7)
MCV: 86 fL (ref 79–97)
Monocytes Absolute: 0.5 10*3/uL (ref 0.1–0.9)
Monocytes: 7 %
Neutrophils Absolute: 4 10*3/uL (ref 1.4–7.0)
Neutrophils: 60 %
Platelets: 307 10*3/uL (ref 150–450)
RBC: 3.85 x10E6/uL (ref 3.77–5.28)
RDW: 13.6 % (ref 11.7–15.4)
RPR Ser Ql: NONREACTIVE
Rh Factor: POSITIVE
Rubella Antibodies, IGG: 1.62 index (ref >0.99)
WBC: 6.6 10*3/uL (ref 3.4–10.8)

## 2021-05-04 LAB — CERVICOVAGINAL ANCILLARY ONLY
Chlamydia: NEGATIVE
Comment: NEGATIVE
Comment: NEGATIVE
Comment: NORMAL
Neisseria Gonorrhea: NEGATIVE
Trichomonas: NEGATIVE

## 2021-05-04 LAB — HCV INTERPRETATION

## 2021-05-04 LAB — HCV AB W REFLEX TO QUANT PCR: HCV Ab: <0.1 s/co ratio (ref 0.0–0.9)

## 2021-05-19 ENCOUNTER — Telehealth: Payer: Self-pay | Admitting: Family Medicine

## 2021-05-19 NOTE — Telephone Encounter (Signed)
Called patient, verified with name and DOB,  and discussed with Ms Spark the results of her Horizon carrier screening which was negative for 4/4 diseases and the [facility] NIPT which was low risk. Discussed that these are screening tests, not diagnostic tests, and thus there is still a risk of her baby having a trisomy or other disorder. Discussed if she is interested in diagnostic testing such as CVS or amniocentesis we would refer her to genetic counseling, as these test are diagnostic. She declined referral to genetic testing at this time, I discussed if she changes her mind at any time she can reach out and let us know. I did not share gender as she was not wanting to know, plans to have her son check as she wants to have a baby reveal party. Answered all questions and concerns.  Burley Saver MD

## 2021-05-24 ENCOUNTER — Encounter: Payer: Self-pay | Admitting: Family Medicine

## 2021-05-27 ENCOUNTER — Encounter: Payer: Self-pay | Admitting: Family Medicine

## 2021-05-27 ENCOUNTER — Ambulatory Visit (INDEPENDENT_AMBULATORY_CARE_PROVIDER_SITE_OTHER): Payer: Medicaid Other | Admitting: Family Medicine

## 2021-05-27 ENCOUNTER — Other Ambulatory Visit: Payer: Self-pay

## 2021-05-27 ENCOUNTER — Telehealth: Payer: Self-pay

## 2021-05-27 VITALS — BP 96/58 | HR 86 | Wt 129.0 lb

## 2021-05-27 DIAGNOSIS — R519 Headache, unspecified: Secondary | ICD-10-CM

## 2021-05-27 DIAGNOSIS — O26892 Other specified pregnancy related conditions, second trimester: Secondary | ICD-10-CM

## 2021-05-27 HISTORY — DX: Headache, unspecified: R51.9

## 2021-05-27 HISTORY — DX: Other specified pregnancy related conditions, second trimester: O26.892

## 2021-05-27 MED ORDER — METOCLOPRAMIDE HCL 10 MG PO TABS: 10.0000 mg | ORAL_TABLET | Freq: Four times a day (QID) | ORAL | 0 refills | Status: DC | PRN

## 2021-05-27 NOTE — Assessment & Plan Note (Signed)
Patient's headache is most consistent with migraine headache.  Character is not consistent with cluster headaches or tension headache.  Vital signs are normal today without any evidence of hypertension and she has benign physical exam.  She has had negative work-up at her ED visit on 04/26/2021 which included CT head, MRI brain, MR venogram to rule out mass and venous thrombosis.  Patient has not had any change in character of her headache, thus repeat MRI is unlikely to be helpful.  Will defer at this time.  Given persistent eye pressure, photophobia, and blurred vision, will send urgent referral to ophthalmology for further evaluation.  For symptoms, will send with Reglan 10 mg to take with Tylenol.  We will also obtain CMP, CBC, urine protein creatinine.  Patient has follow-up appointment next week for next routine OB visit.  Strict return precautions provided.

## 2021-05-27 NOTE — Patient Instructions (Signed)
We are going to give you a referral to ophthalmology to better evaluate your right eye.   For your symptoms, we are going to add Reglan 10 mg.  Take this in combination with the acetaminophen or by itself to see if you have any relief in your headache.  Continue to avoid things that worsen your headache.  Make sure that you are drinking plenty of water. If you have any loss of fluid, abdominal cramping, vaginal bleeding, sudden severe worsening of headache, loss or severe change in vision, or change in mental status, please go straight to the MAU. You have your next OB appointment with [place] on 06/02/2021.  If you need to be seen prior to that, please give [place] a call and we will get you in.

## 2021-05-27 NOTE — Telephone Encounter (Signed)
Patient calls nurse line regarding headaches. Patient reports that she wakes up with a headache and goes to sleep with headache. Patient reports taking tylenol 1000 mg multiple times per day. Advised that she should not be taking more than 4 grams of tylenol per 24 hours. Reports that headaches "have been going on for awhile now"   Patient reports light sensitivity. No other symptoms.   Please advise additional recommendations. Patient has follow up OB appointment on 6/16.   ED precautions given.   Veronda Prude, RN

## 2021-05-27 NOTE — Progress Notes (Addendum)
    SUBJECTIVE:   CHIEF COMPLAINT / HPI:   Headache  X 1 month. Location: Right frontal area. pressure around right eye.  Nature: Stabbing, pounding.  Worse when waking up and going to bed.  Has been using tylenol 1,000 mg at least twice daily with mild relief. Has not been taking ASA.  Has not had any improvement since two weeks ago.  Blurred vision w/ photophobia.   No weakness, numbness, tingling in extremities  No SOB, coughing, n/v, diarrhea  Had flu, tested positive on 04/26/21, mild congestions, no allergies  No swelling in legs, no frothy pee Patient has had headaches during her pregnancies previously but never anything that has lasted this long.  Patient was seen in ED on 04/26/21 for same headache. Patient reports that the headache is the same in character since her ED visit. Work up in the MAU included CT head, MR brain and MR venogram head which were all negative. 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   PERTINENT  PMH / PSH: No history of pre e in previous pregnancies.  No history of headaches or migraines.  OBJECTIVE:   BP (!) 96/58   Pulse 86   Wt 129 lb (58.5 kg)   LMP 01/21/2021   BMI 26.05 kg/m   FHR: 155 General: Nontoxic, no acute distress.  Only have the lights on in the room for comfort of patient. HEENT: NCAT.  PERRLA.  EOMI.  Tenderness to palpation of right frontal and periorbital area.  No edema of face.  Ears clear bilaterally with pearly gray TMs.  Oropharynx clear.  Nares clear.  Neck soft with no lymphadenopathy. Chest: Regular rate and rhythm Lungs clear to auscultation bilaterally Abdomen: No tenderness to palpation. MSK: Trace lower extremity edema.  ASSESSMENT/PLAN:   Pregnancy headache in second trimester  Patient's headache is most consistent with migraine headache.  Character is not consistent with cluster headaches or tension headache.  Vital signs are normal today without any evidence of hypertension  and she has benign physical exam.  She has had negative work-up at her ED visit on 04/26/2021 which included CT head, MRI brain, MR venogram to rule out mass and venous thrombosis.  Patient has not had any change in character of her headache, thus repeat MRI is unlikely to be helpful.  Will defer at this time.  Given persistent eye pressure, photophobia, and blurred vision, will send urgent referral to ophthalmology for further evaluation.  For symptoms, will send with Reglan 10 mg to take with Tylenol.  We will also obtain CMP, CBC, urine protein creatinine.  Patient has follow-up appointment next week for next routine OB visit.  Strict return precautions provided.  Case discussed with Dr. Pollie Meyer.   Spoke with Jazmin, referral coordinator who suggests patient call Medicaid to find ophthalmologist in network.  Attempted to call patient with no answer and no option for voicemail.  Will send MyChart message.  Melene Plan, MD [facility]

## 2021-05-27 NOTE — Telephone Encounter (Signed)
Scheduled patient appointment in [facility] clinic this morning.   Veronda Prude, RN

## 2021-05-28 LAB — COMPREHENSIVE METABOLIC PANEL
ALT: 5 IU/L (ref 0–32)
AST: 10 IU/L (ref 0–40)
Albumin/Globulin Ratio: 1.4 (ref 1.2–2.2)
Albumin: 4 g/dL (ref 3.8–4.8)
Alkaline Phosphatase: 48 IU/L (ref 44–121)
BUN/Creatinine Ratio: 13 (ref 9–23)
BUN: 9 mg/dL (ref 6–20)
Bilirubin Total: <0.2 mg/dL (ref 0.0–1.2)
CO2: 21 mmol/L (ref 20–29)
Calcium: 9.2 mg/dL (ref 8.7–10.2)
Chloride: 99 mmol/L (ref 96–106)
Creatinine, Ser: 0.68 mg/dL (ref 0.57–1.00)
Globulin, Total: 2.8 g/dL (ref 1.5–4.5)
Glucose: 70 mg/dL (ref 65–99)
Potassium: 3.8 mmol/L (ref 3.5–5.2)
Sodium: 135 mmol/L (ref 134–144)
Total Protein: 6.8 g/dL (ref 6.0–8.5)
eGFR: 116 mL/min/{1.73_m2} (ref >59)

## 2021-05-28 LAB — CBC
Hematocrit: 33 % — ABNORMAL LOW (ref 34.0–46.6)
Hemoglobin: 10.5 g/dL — ABNORMAL LOW (ref 11.1–15.9)
MCH: 28.5 pg (ref 26.6–33.0)
MCHC: 31.8 g/dL (ref 31.5–35.7)
MCV: 90 fL (ref 79–97)
Platelets: 288 10*3/uL (ref 150–450)
RBC: 3.68 x10E6/uL — ABNORMAL LOW (ref 3.77–5.28)
RDW: 14.2 % (ref 11.7–15.4)
WBC: 7 10*3/uL (ref 3.4–10.8)

## 2021-05-28 LAB — PROTEIN / CREATININE RATIO, URINE
Creatinine, Urine: 24 mg/dL
Protein, Ur: 7.8 mg/dL
Protein/Creat Ratio: 325 mg/g creat — ABNORMAL HIGH (ref 0–200)

## 2021-06-02 ENCOUNTER — Ambulatory Visit (INDEPENDENT_AMBULATORY_CARE_PROVIDER_SITE_OTHER): Payer: Medicaid Other | Admitting: Family Medicine

## 2021-06-02 ENCOUNTER — Other Ambulatory Visit: Payer: Self-pay

## 2021-06-02 VITALS — BP 88/58 | HR 84 | Wt 132.0 lb

## 2021-06-02 DIAGNOSIS — Z3490 Encounter for supervision of normal pregnancy, unspecified, unspecified trimester: Secondary | ICD-10-CM

## 2021-06-02 LAB — POCT 1 HR PRENATAL GLUCOSE: Glucose 1 Hr Prenatal, POC: 98 mg/dL

## 2021-06-02 NOTE — Progress Notes (Signed)
  Patient Name: Leah Walls Date of Birth: 11/22/1986 [facility] Prenatal Visit  Leah Walls is a 35 y.o. [id number] at [redacted]w[redacted]d here for routine follow up. She is dated by 12 week ultrasound.  She reports no complaints.  She denies vaginal bleeding.  See flow sheet for details.  Vitals:   06/02/21 1634  BP: (!) 88/58  Pulse: 84     A/P: Pregnancy at [redacted]w[redacted]d.  Doing well.    Routine Prenatal Care:  Dating reviewed, dating tab is correct Fetal heart tones Appropriate Influenza vaccine not administered as not influenza season.   Patient declines COVID vaccination.  The patient has the following indication for screening preexisting diabetes: BMI >25 and history of GDM . 1 hour glucola wnl today. Anatomy ultrasound ordered to be scheduled at 18-20 weeks. Patient had genetic screening with panorama-- low risk result. Referral to genetic counseling offered due to age >35, patient declined. Pregnancy education including expected weight gain in pregnancy, OTC medication use, continued use of prenatal vitamin, smoking cessation if applicable, and nutrition in pregnancy.   Bleeding and pain precautions reviewed. Continue ASA 81mg  daily.  2. Pregnancy issues include the following and were addressed as appropriate today:  Varicella unknown-- varicella antibody obtained today Prior c-section (and subsequent successful VBAC). Will refer to [facility] at 32 weeks. Hx of HSV-- needs Valtrex suppression Headaches- patient seen for headaches 1 week ago, states these are improved. Awaiting referral to ophthalmology. BP wnl today. Problem list  and pregnancy box updated: Yes.    Follow up 4 weeks.

## 2021-06-02 NOTE — Patient Instructions (Addendum)
It was great to see you!  Things we discussed at today's visit: - Keep taking your prenatal vitamin and aspirin every day -Someone will call you to schedule your anatomy ultrasound -Have fun at your gender reveal !!!  Take care and seek immediate care sooner if you develop any concerns.  Dr. [facility]   Second Trimester of Pregnancy  The second trimester of pregnancy is from week 13 through week 27. This is months 4 through 6 of pregnancy. The second trimester is often a time when you feel your best. Your body has adjusted to being pregnant, and you begin to feelbetter physically. During the second trimester: Morning sickness has lessened or stopped completely. You may have more energy. You may have an increase in appetite. The second trimester is also a time when the unborn baby (fetus) is growing rapidly. At the end of the sixth month, the fetus may be up to 12 inches long and weigh about 1 pounds. You will likely begin to feel the baby move (quickening) between 16 and 20 weeks of pregnancy. Body changes during your second trimester Your body continues to go through many changes during your second trimester.The changes vary and generally return to normal after the baby is born. Physical changes Your weight will continue to increase. You will notice your lower abdomen bulging out. You may begin to get stretch marks on your hips, abdomen, and breasts. Your breasts will continue to grow and to become tender. Dark spots or blotches (chloasma or mask of pregnancy) may develop on your face. A dark line from your belly button to the pubic area (linea nigra) may appear. You may have changes in your hair. These can include thickening of your hair, rapid growth, and changes in texture. Some people also have hair loss during or after pregnancy, or hair that feels dry or thin. Health changes You may develop headaches. You may have heartburn. You may develop constipation. You  may develop hemorrhoids or swollen, bulging veins (varicose veins). Your gums may bleed and may be sensitive to brushing and flossing. You may urinate more often because the fetus is pressing on your bladder. You may have back pain. This is caused by: Weight gain. Pregnancy hormones that are relaxing the joints in your pelvis. A shift in weight and the muscles that support your balance. Follow these instructions at home: Medicines Follow your health care provider's instructions regarding medicine use. Specific medicines may be either safe or unsafe to take during pregnancy. Do not take any medicines unless approved by your health care provider. Take a prenatal vitamin that contains at least 600 micrograms (mcg) of folic acid. Eating and drinking Eat a healthy diet that includes fresh fruits and vegetables, whole grains, good sources of protein such as meat, eggs, or tofu, and low-fat dairy products. Avoid raw meat and unpasteurized juice, milk, and cheese. These carry germs that can harm you and your baby. You may need to take these actions to prevent or treat constipation: Drink enough fluid to keep your urine pale yellow. Eat foods that are high in fiber, such as beans, whole grains, and fresh fruits and vegetables. Limit foods that are high in fat and processed sugars, such as fried or sweet foods. Activity Exercise only as directed by your health care provider. Most people can continue their usual exercise routine during pregnancy. Try to exercise for 30 minutes at least 5 days a week. Stop exercising if you develop contractions in your uterus. Stop  exercising if you develop pain or cramping in the lower abdomen or lower back. Avoid exercising if it is very hot or humid or if you are at a high altitude. Avoid heavy lifting. If you choose to, you may have sex unless your health care provider tells you not to. Relieving pain and discomfort Wear a supportive bra to prevent discomfort from  breast tenderness. Take warm sitz baths to soothe any pain or discomfort caused by hemorrhoids. Use hemorrhoid cream if your health care provider approves. Rest with your legs raised (elevated) if you have leg cramps or low back pain. If you develop varicose veins: Wear support hose as told by your health care provider. Elevate your feet for 15 minutes, 3-4 times a day. Limit salt in your diet. Safety Wear your seat belt at all times when driving or riding in a car. Talk with your health care provider if someone is verbally or physically abusive to you. Lifestyle Do not use hot tubs, steam rooms, or saunas. Do not douche. Do not use tampons or scented sanitary pads. Avoid cat litter boxes and soil used by cats. These carry germs that can cause birth defects in the baby and possibly loss of the fetus by miscarriage or stillbirth. Do not use herbal remedies, alcohol, illegal drugs, or medicines that are not approved by your health care provider. Chemicals in these products can harm your baby. Do not use any products that contain nicotine or tobacco, such as cigarettes, e-cigarettes, and chewing tobacco. If you need help quitting, ask your health care provider. General instructions During a routine prenatal visit, your health care provider will do a physical exam and other tests. He or she will also discuss your overall health. Keep all follow-up visits. This is important. Ask your health care provider for a referral to a local prenatal education class. Ask for help if you have counseling or nutritional needs during pregnancy. Your health care provider can offer advice or refer you to specialists for help with various needs. Where to find more information American Pregnancy Association: americanpregnancy.org [employer]: https://www.todd-brady.net/ Office on [employer] Health: MightyReward.[employer].nz Contact a health care provider if you have: A  headache that does not go away when you take medicine. Vision changes or you see spots in front of your eyes. Mild pelvic cramps, pelvic pressure, or nagging pain in the abdominal area. Persistent nausea, vomiting, or diarrhea. A bad-smelling vaginal discharge or foul-smelling urine. Pain when you urinate. Sudden or extreme swelling of your face, hands, ankles, feet, or legs. A fever. Get help right away if you: Have fluid leaking from your vagina. Have spotting or bleeding from your vagina. Have severe abdominal cramping or pain. Have difficulty breathing. Have chest pain. Have fainting spells. Have not felt your baby move for the time period told by your health care provider. Have new or increased pain, swelling, or redness in an arm or leg. Summary The second trimester of pregnancy is from week 13 through week 27 (months 4 through 6). Do not use herbal remedies, alcohol, illegal drugs, or medicines that are not approved by your health care provider. Chemicals in these products can harm your baby. Exercise only as directed by your health care provider. Most people can continue their usual exercise routine during pregnancy. Keep all follow-up visits. This is important. This information is not intended to replace advice given to you by your health care provider. Make sure you discuss any questions you have with your healthcare  provider. Document Revised: 05/12/2020 Document Reviewed: 03/18/2020 Elsevier Patient Education  2022 [employer].

## 2021-06-03 ENCOUNTER — Encounter: Payer: Self-pay | Admitting: Family Medicine

## 2021-06-03 DIAGNOSIS — Z8619 Personal history of other infectious and parasitic diseases: Secondary | ICD-10-CM | POA: Insufficient documentation

## 2021-06-03 LAB — VARICELLA ZOSTER ANTIBODY, IGG: Varicella zoster IgG: 604 index (ref >165)

## 2021-06-06 ENCOUNTER — Other Ambulatory Visit: Payer: Self-pay | Admitting: Family Medicine

## 2021-06-09 ENCOUNTER — Other Ambulatory Visit: Payer: Self-pay

## 2021-06-09 ENCOUNTER — Ambulatory Visit (INDEPENDENT_AMBULATORY_CARE_PROVIDER_SITE_OTHER): Payer: Medicaid Other | Admitting: Family Medicine

## 2021-06-09 VITALS — BP 90/50 | HR 84 | Wt 131.0 lb

## 2021-06-09 DIAGNOSIS — R300 Dysuria: Secondary | ICD-10-CM | POA: Diagnosis not present

## 2021-06-09 DIAGNOSIS — N3000 Acute cystitis without hematuria: Secondary | ICD-10-CM

## 2021-06-09 LAB — POCT URINALYSIS DIP (MANUAL ENTRY)
Bilirubin, UA: NEGATIVE
Glucose, UA: NEGATIVE mg/dL
Ketones, POC UA: NEGATIVE mg/dL
Nitrite, UA: NEGATIVE
Protein Ur, POC: 30 mg/dL — AB
Spec Grav, UA: 1.015 (ref 1.010–1.025)
Urobilinogen, UA: 0.2 E.U./dL
pH, UA: 6 (ref 5.0–8.0)

## 2021-06-09 LAB — POCT UA - MICROSCOPIC ONLY: WBC, Ur, HPF, POC: >20 (ref 0–5)

## 2021-06-09 MED ORDER — CEPHALEXIN 500 MG PO CAPS: 500.0000 mg | ORAL_CAPSULE | Freq: Four times a day (QID) | ORAL | 0 refills | Status: AC

## 2021-06-09 NOTE — Progress Notes (Signed)
   Subjective:   Patient ID: Leah Walls    DOB: 1986-10-09, 35 y.o. female   MRN: [id number]  Leah Walls is a 35 y.o. female currently [redacted] weeks pregnant here for dysuria. She has been having dysuria and frequency x 3-4 days. Denies fevers or chills. Denies blood in urine.   Review of Systems:  Per HPI.   Objective:   BP (!) 90/50   Pulse 84   Wt 131 lb (59.4 kg)   LMP 01/21/2021   BMI 26.46 kg/m  FHR: 150  Vitals and nursing note reviewed.  General: pleasant young female, sitting comfortably in exam chair, well nourished, well developed, in no acute distress with non-toxic appearance Resp: breathing comfortably on room air, speaking in full sentences Abdomen: (+) suprapubic pain Skin: warm, dry MSK: gait normal Neuro: Alert and oriented, speech normal  Assessment & Plan:   Dysuria Acute, symptomatic. UA notable for large leuks, negative nitrites. Will empirically treat with Keflex x 5 days. Will send for culture and adjust treatment as needed. Discussed return precautions.   Orders Placed This Encounter  Procedures   Culture, OB Urine   POCT urinalysis dipstick   POCT UA - Microscopic Only   Meds ordered this encounter  Medications   cephALEXin (KEFLEX) 500 MG capsule    Sig: Take 1 capsule (500 mg total) by mouth 4 (four) times daily for 5 days.    Dispense:  20 capsule    Refill:  0      Orpah Cobb, DO PGY-3, [facility] 06/09/2021 1:22 PM

## 2021-06-09 NOTE — Assessment & Plan Note (Signed)
Acute, symptomatic. UA notable for large leuks, negative nitrites. Will empirically treat with Keflex x 5 days. Will send for culture and adjust treatment as needed. Discussed return precautions.

## 2021-06-12 ENCOUNTER — Telehealth: Payer: Self-pay | Admitting: Family Medicine

## 2021-06-12 ENCOUNTER — Encounter: Payer: Self-pay | Admitting: Family Medicine

## 2021-06-12 LAB — URINE CULTURE, OB REFLEX

## 2021-06-12 LAB — CULTURE, OB URINE

## 2021-06-12 NOTE — Telephone Encounter (Signed)
HIPPA compliant callback message left.    Note that her urine culture came back with Ecoli+  Dr. Mauri Reading prescribed Keflex during her visit for UTI symptoms.  She need TOC in 3 weeks. I also noticed that her prenatal f/u appointment was not scheduled. I went ahead and make one for her with Dr. Anner Crete who has been caring for her pregnancy.  Whenever she calls, please advise her of her f/u appointment scheduled for July 18 at 3:35 PM.

## 2021-07-04 ENCOUNTER — Other Ambulatory Visit: Payer: Self-pay

## 2021-07-04 ENCOUNTER — Ambulatory Visit (INDEPENDENT_AMBULATORY_CARE_PROVIDER_SITE_OTHER): Payer: Medicaid Other | Admitting: Family Medicine

## 2021-07-04 ENCOUNTER — Other Ambulatory Visit (HOSPITAL_COMMUNITY)
Admission: RE | Admit: 2021-07-04 | Discharge: 2021-07-04 | Disposition: A | Discharge status: Home / Self Care | Payer: Medicaid Other | Source: Ambulatory Visit | Attending: Family Medicine | Admitting: Family Medicine

## 2021-07-04 VITALS — BP 92/50 | HR 92 | Wt 142.0 lb

## 2021-07-04 DIAGNOSIS — O2342 Unspecified infection of urinary tract in pregnancy, second trimester: Secondary | ICD-10-CM | POA: Diagnosis not present

## 2021-07-04 DIAGNOSIS — Z3490 Encounter for supervision of normal pregnancy, unspecified, unspecified trimester: Secondary | ICD-10-CM | POA: Diagnosis not present

## 2021-07-04 DIAGNOSIS — N898 Other specified noninflammatory disorders of vagina: Secondary | ICD-10-CM | POA: Diagnosis not present

## 2021-07-04 LAB — POCT URINALYSIS DIP (MANUAL ENTRY)
Bilirubin, UA: NEGATIVE
Blood, UA: NEGATIVE
Glucose, UA: NEGATIVE mg/dL
Leukocytes, UA: NEGATIVE
Nitrite, UA: NEGATIVE
Protein Ur, POC: NEGATIVE mg/dL
Spec Grav, UA: 1.025 (ref 1.010–1.025)
Urobilinogen, UA: 0.2 E.U./dL
pH, UA: 6.5 (ref 5.0–8.0)

## 2021-07-04 LAB — POCT WET PREP (WET MOUNT)
Clue Cells Wet Prep Whiff POC: NEGATIVE
Trichomonas Wet Prep HPF POC: ABSENT

## 2021-07-04 NOTE — Patient Instructions (Addendum)
It was great to see you!  I will call you with the results of your testing.   Safe Medications in Pregnancy   Acne:  Benzoyl Peroxide  Salicylic Acid   Backache/Headache:  Tylenol: 2 regular strength every 4 hours OR               2 Extra strength every 6 hours   Colds/Coughs/Allergies:  Benadryl (alcohol free) 25 mg every 6 hours as needed  Breath right strips  Claritin  Cepacol throat lozenges  Chloraseptic throat spray  Cold-Eeze- up to three times per day  Cough drops, alcohol free  Flonase (by prescription only)  Guaifenesin  Mucinex  Robitussin DM (plain only, alcohol free)  Saline nasal spray/drops  Sudafed (pseudoephedrine) & Actifed * use only after [redacted] weeks gestation and if you do not have high blood pressure  Tylenol  Vicks Vaporub  Zinc lozenges  Zyrtec   Constipation:  Colace  Ducolax suppositories  Fleet enema  Glycerin suppositories  Metamucil  Milk of magnesia  Miralax  Senokot  Smooth move tea   Diarrhea:  Kaopectate  Imodium A-D   *NO pepto Bismol   Hemorrhoids:  Anusol  Anusol HC  Preparation H  Tucks   Indigestion:  Tums  Maalox  Mylanta  Zantac  Pepcid   Insomnia:  Benadryl (alcohol free) 25mg  every 6 hours as needed  Tylenol PM  Unisom, no Gelcaps   Leg Cramps:  Tums  MagGel   Nausea/Vomiting:  Bonine  Dramamine  Emetrol  Ginger extract  Sea bands  Meclizine  Nausea medication to take during pregnancy:  Unisom (doxylamine succinate 25 mg tablets) Take one tablet daily at bedtime. If symptoms are not adequately controlled, the dose can be increased to a maximum recommended dose of two tablets daily (1/2 tablet in the morning, 1/2 tablet mid-afternoon and one at bedtime).  Vitamin B6 100mg  tablets. Take one tablet twice a day (up to 200 mg per day).   Skin Rashes:  Aveeno products  Benadryl cream or 25mg  every 6 hours as needed  Calamine Lotion  1% cortisone cream   Yeast infection:  Gyne-lotrimin 7   Monistat 7    **If taking multiple medications, please check labels to avoid duplicating the same active ingredients  **take medication as directed on the label  ** Do not exceed 4000 mg of tylenol in 24 hours  **Do not take medications that contain aspirin or ibuprofen

## 2021-07-04 NOTE — Progress Notes (Signed)
  [facility] Prenatal Visit  DEVINA BEZOLD is a 35 y.o. [id number] at [redacted]w[redacted]d here for routine follow up. She is dated by 12 week ultrasound.  She reports vaginal irritation/itching x2 days. No discharge. No new sexual partners. She reports good fetal movement. No bleeding, loss of fluid, contractions. See flow sheet for details. Vitals:   07/04/21 1557  BP: (!) 92/50  Pulse: 92   Pelvic exam performed in the presence of a chaperone. External genitalia within normal limits.  Vaginal mucosa pink, moist, normal rugae.  Nonfriable cervix without lesions, no discharge or bleeding noted on speculum exam.   A/P: Pregnancy at [redacted]w[redacted]d.  Doing well.   Dating reviewed, dating tab is correct Fetal heart tones Appropriate Fundal height within expected range.  Anatomy ultrasound was ordered but never scheduled. It has now been scheduled for first available appt, which is 08/03/2021. Influenza vaccine not administered as not influenza season. Marland Kitchen  COVID vaccination was discussed and patient declines.  Patient had 1hr glucola earlier this pregnancy which was wnl.  Pregnancy education provided on the following topics: fetal growth and movement, upcoming ultrasound assessment   [facility]uled for [facility] during second trimester on 08/11/2021 Preterm labor precautions given.  Continue PNV and ASA 81mg  daily  2. Pregnancy issues include the following and were addressed as appropriate today:   Vaginal irritation- wet prep performed today was negative. GC/chlamydia also obtained per patient request. Will call with results.   Recent UTI. Treated w/Keflex. Will check repeat UA and OB urine culture today  Hx of HSV: needs Valtrex suppression at 36 weeks  Hx of c-section and subsequent successful VBAC x6. Will refer to [facility] at 32 weeks.  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 Patient counseled.  Problem list and pregnancy box updated: Yes.    Follow up 3 weeks in [facility].   [facility], MD

## 2021-07-06 LAB — CERVICOVAGINAL ANCILLARY ONLY
Chlamydia: NEGATIVE
Comment: NEGATIVE
Comment: NORMAL
Neisseria Gonorrhea: NEGATIVE

## 2021-07-07 ENCOUNTER — Telehealth: Payer: Self-pay | Admitting: Family Medicine

## 2021-07-07 NOTE — Telephone Encounter (Signed)
Patient informed of normal wet prep results and negative GC/chlamydia from recent visit.   Unfortunately her urine culture was never sent, but dipstick was normal. Will obtain urine culture at next appointment.

## 2021-07-10 IMAGING — MR MR [PERSON_NAME] HEAD
15 of 16 series · 46 of 48 positions shown · non-contrast
Comparison: Brain MRI earlier same day.

CLINICAL DATA: Headache, nausea, vomiting and blurred vision

EXAM:
MR VENOGRAM HEAD WITHOUT  CONTRAST
TECHNIQUE: Angiographic images of the intracranial venous structures were
acquired using MRV technique without intravenous contrast.

[Series 5: DWI · axial · 3.0mm · 0.88mm/px · z∈[-100,+28]mm · 4 of 96 slices shown (1 of 4)]
[im 1/96]
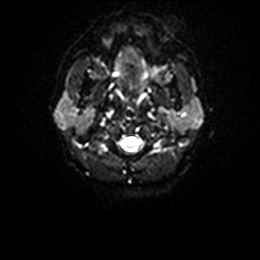
[im 32/96]
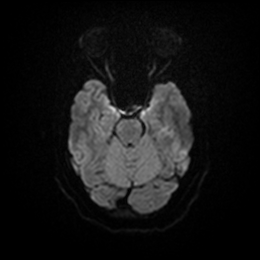
[im 64/96]
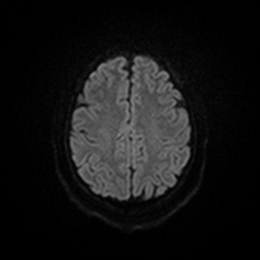
[im 96/96]
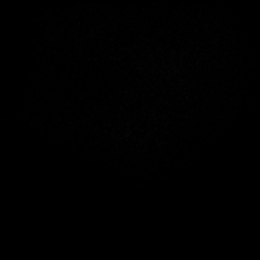

[Series 6: DWI · axial · 3.0mm · 0.88mm/px · z∈[-100,+25]mm · 2 of 47 slices shown (2 of 4)]
[im 1/47]
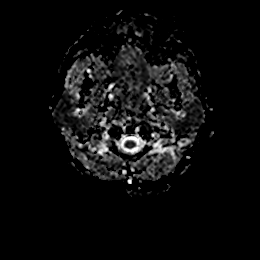
[im 47/47]
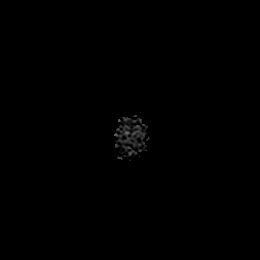

[Series 7: DWI · coronal · 4.0mm · 0.88mm/px · 3 of 64 slices shown (3 of 4)]
[im 1/64]
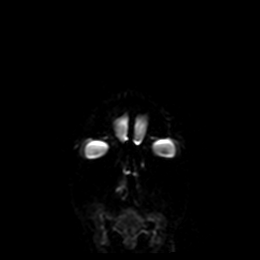
[im 32/64]
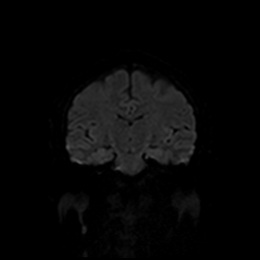
[im 64/64]
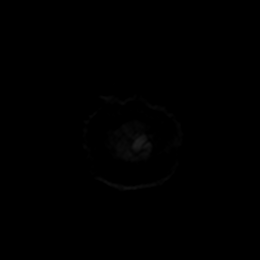

[Series 8: DWI · coronal · 4.0mm · 0.88mm/px · 1 of 32 slices shown (4 of 4)]
[im 1/32]
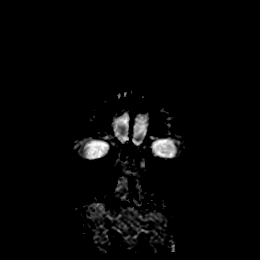

[Series 9: T1 · sagittal · 5.0mm · 0.75mm/px · 1 of 23 slices shown]
[im 1/23]
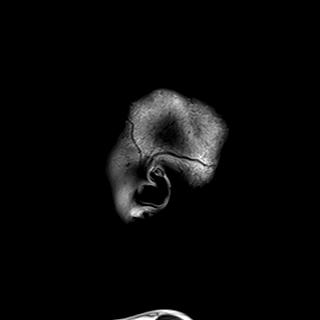

[Series 10: T2 · axial · 5.0mm · 0.72mm/px · 1 of 25 slices shown (1 of 2)]
[im 1/25]
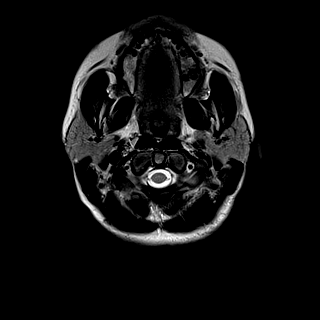

[Series 11: FLAIR · axial · 5.0mm · 0.45mm/px · 1 of 25 slices shown]
[im 1/25]
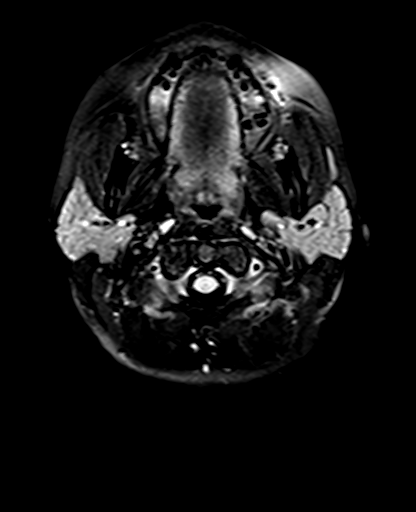

[Series 12: mag_images · axial · 3.0mm · 0.90mm/px · z∈[-119,+42]mm · 2 of 60 slices shown]
[im 1/60]
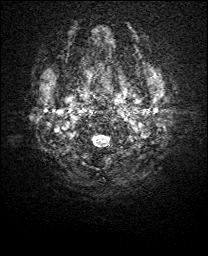
[im 60/60]
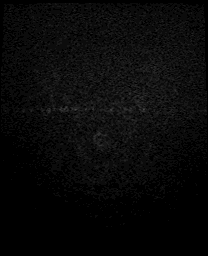

[Series 13: pha_images · axial · 3.0mm · 0.90mm/px · z∈[-119,+31]mm · 2 of 55 slices shown]
[im 1/55]
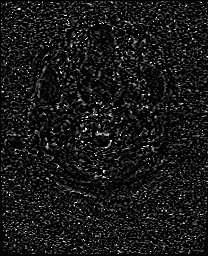
[im 55/55]
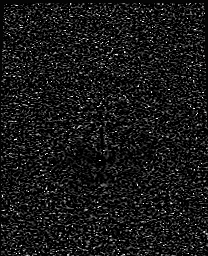

[Series 14: swi_images · axial · 3.0mm · 0.90mm/px · z∈[-119,+42]mm · 2 of 60 slices shown]
[im 1/60]
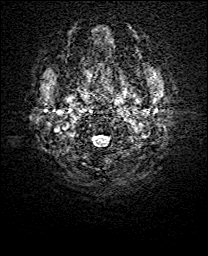
[im 60/60]
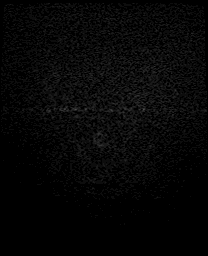

[Series 15: mip_images(sw) · axial · 24.0mm · 0.90mm/px · z∈[-109,+32]mm · 2 of 53 slices shown]
[im 1/53]
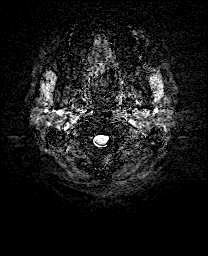
[im 53/53]
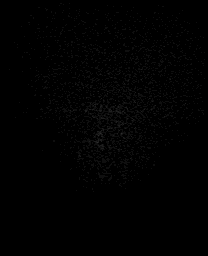

[Series 16: tof_fl2d_paracor · coronal · 2.0mm · 0.98mm/px · 6 of 140 slices shown]
[im 1/140]
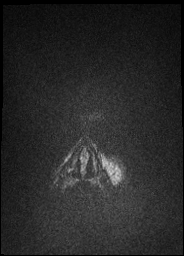
[im 28/140]
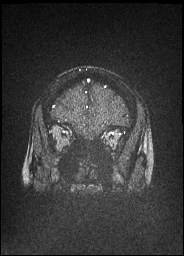
[im 56/140]
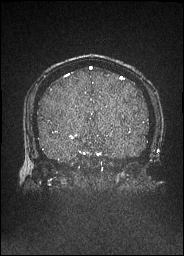
[im 84/140]
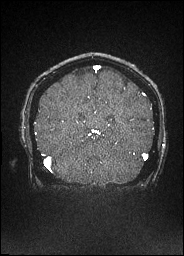
[im 112/140]
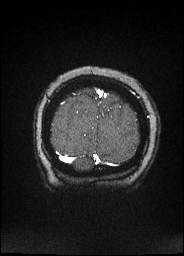
[im 140/140]
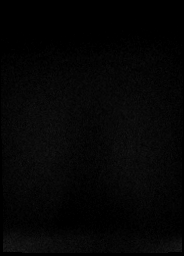

[Series 21: T2 · coronal · 5.0mm · 0.34mm/px · 1 of 29 slices shown (2 of 2)]
[im 1/29]
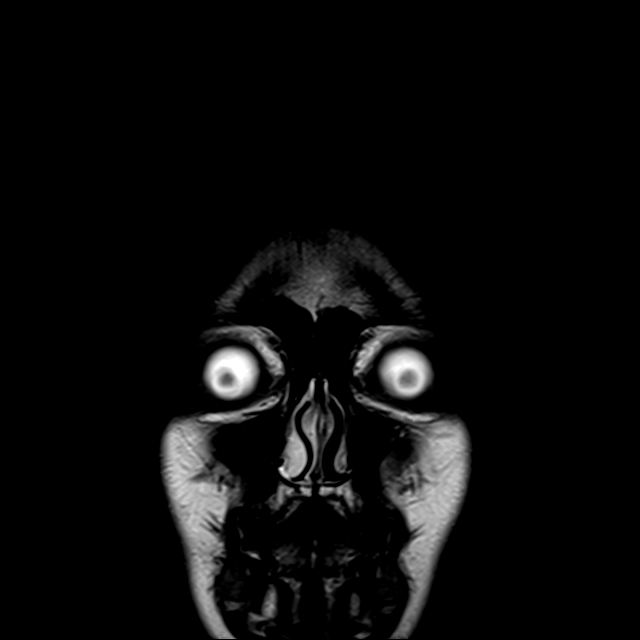

[Series 22: venous inhance coronal · coronal · portal-venous · 0.9mm · 0.57mm/px · 9 of 224 slices shown]
[im 1/224]
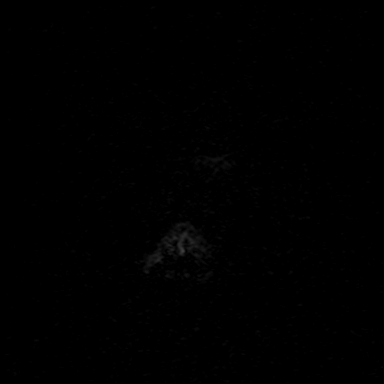
[im 28/224]
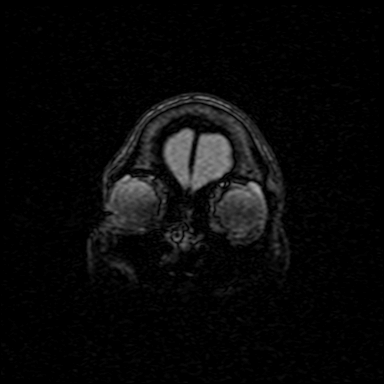
[im 56/224]
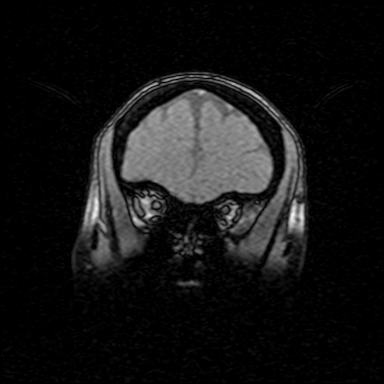
[im 84/224]
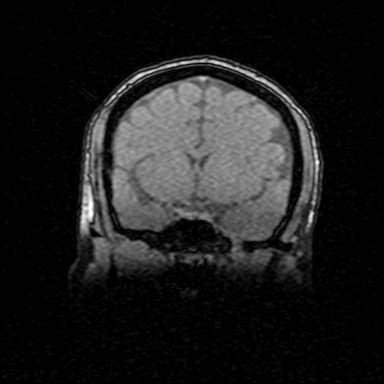
[im 112/224]
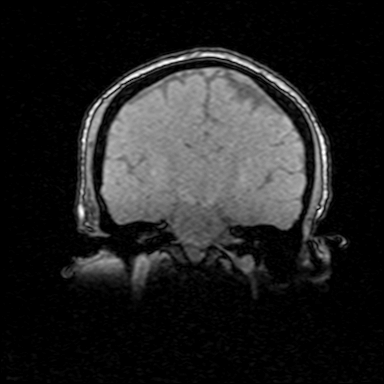
[im 140/224]
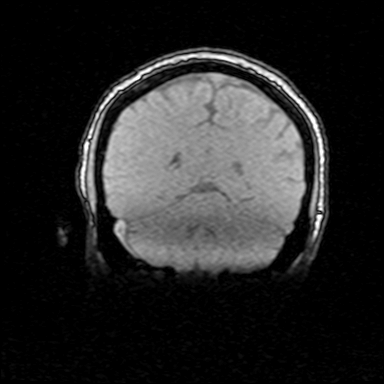
[im 168/224]
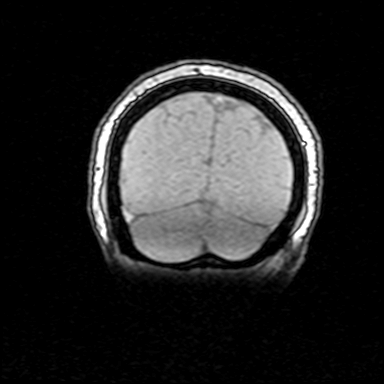
[im 196/224]
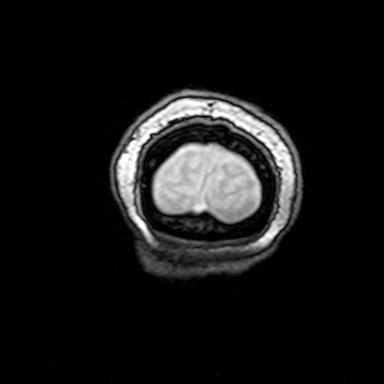
[im 224/224]
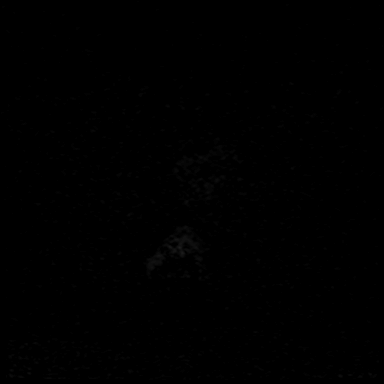

[Series 23: venous inhance coronal_msum · coronal · portal-venous · 0.9mm · 0.57mm/px · 9 of 214 slices shown]
[im 1/214]
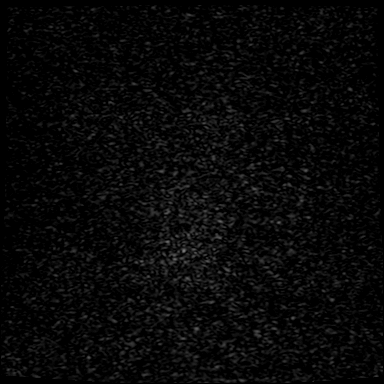
[im 27/214]
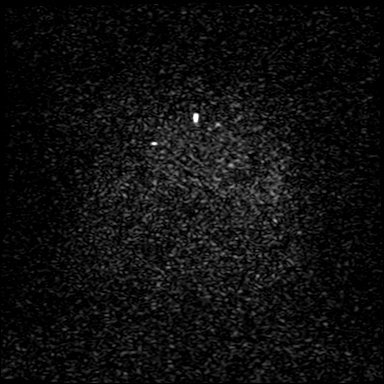
[im 54/214]
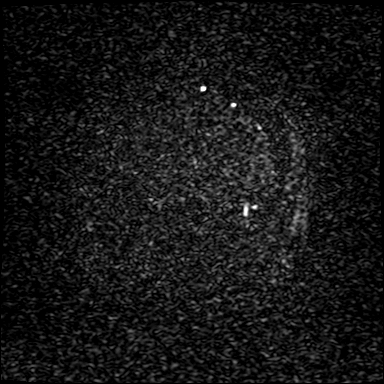
[im 80/214]
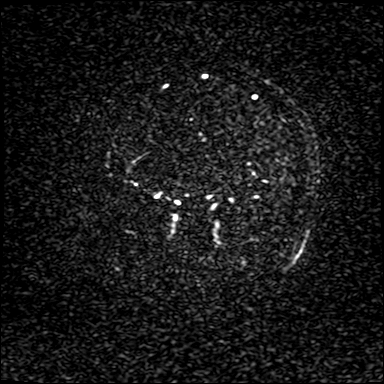
[im 107/214]
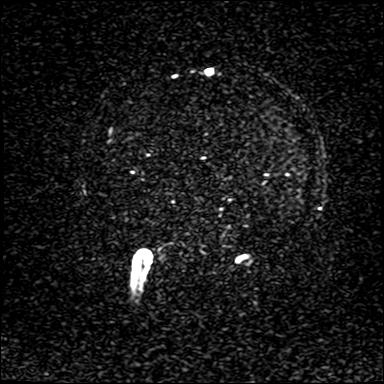
[im 134/214]
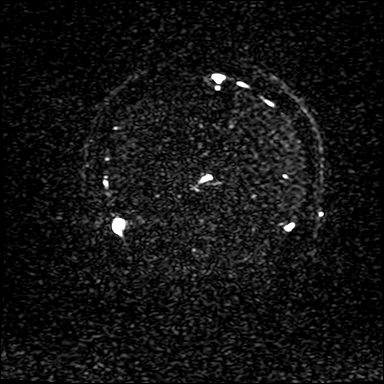
[im 160/214]
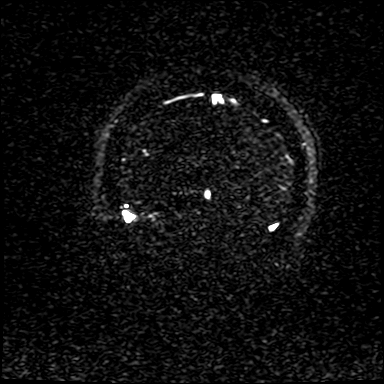
[im 187/214]
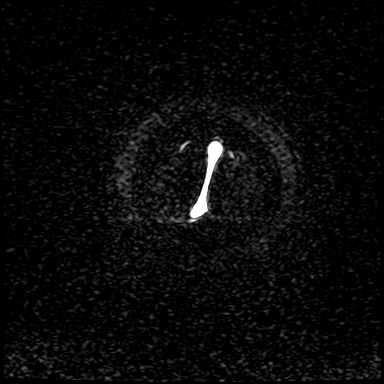
[im 214/214]
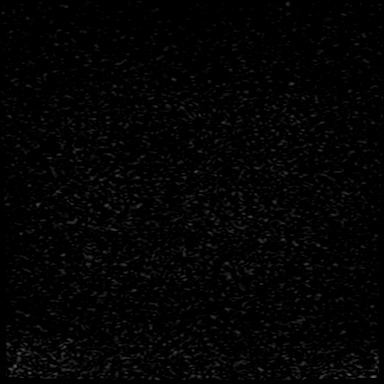

[46 of 48 positions shown; findings below may reference images not displayed]

FINDINGS: Normal intracranial venography. Superior sagittal sinus is widely
patent. Both transverse sinuses are normal, the right being
dominant. Flow present in both jugular veins. Deep veins appear
normal. No sign of superficial venous thrombosis.
IMPRESSION: Normal intracranial MR venography.

## 2021-07-10 IMAGING — CT CT HEAD W/O CM
4 series · 16 of 47 positions shown, 18 images · non-contrast
Comparison: None.

CLINICAL DATA: Headache, nausea, vomiting, blurred vision

EXAM:
CT HEAD WITHOUT CONTRAST
TECHNIQUE: Contiguous axial images were obtained from the base of the skull
through the vertex without intravenous contrast.

[Series 2: head without · axial · non-contrast · 0.39mm/px · z∈[-166,-66]mm · 7 of 28 slices shown, 9 images]
[im 4/28  brain]
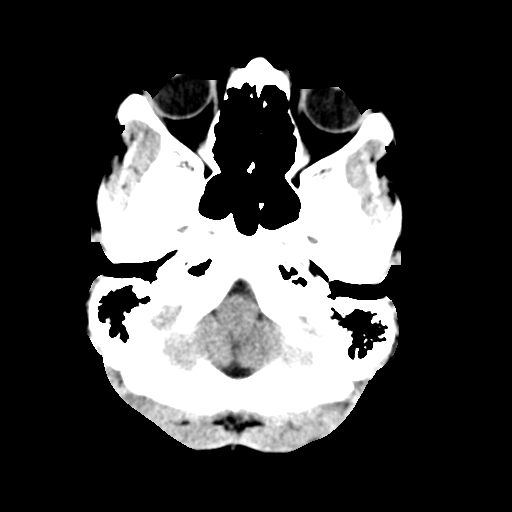
[im 4/28  bone]
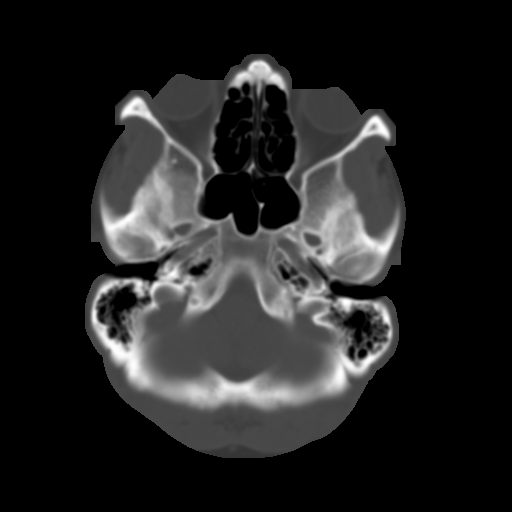
[im 7/28  brain]
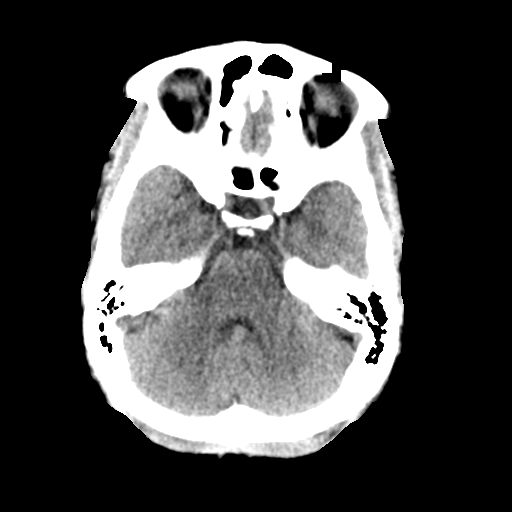
[im 11/28  brain]
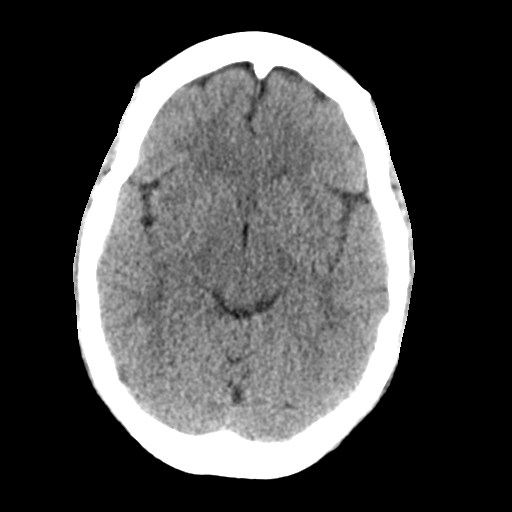
[im 14/28  brain]
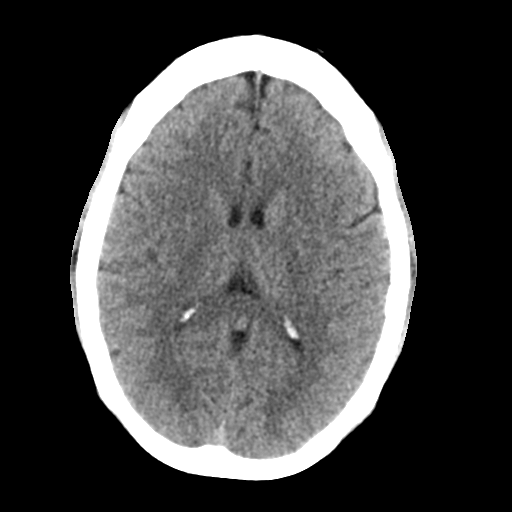
[im 17/28  brain]
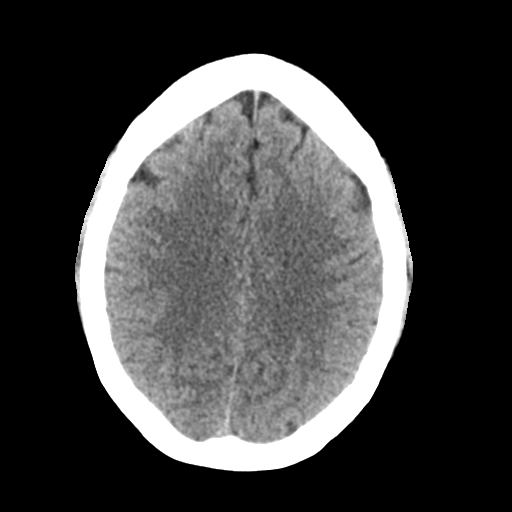
[im 17/28  bone]
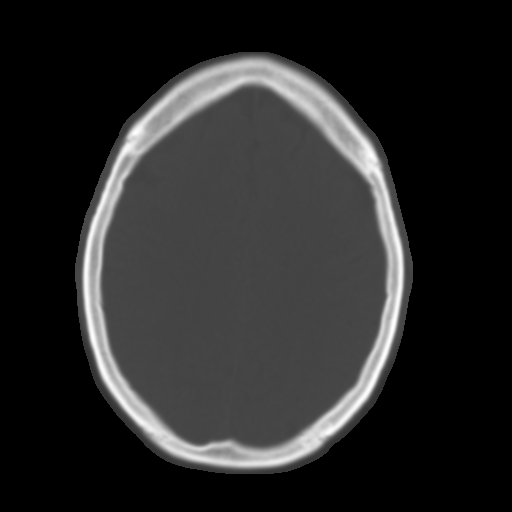
[im 21/28  brain]
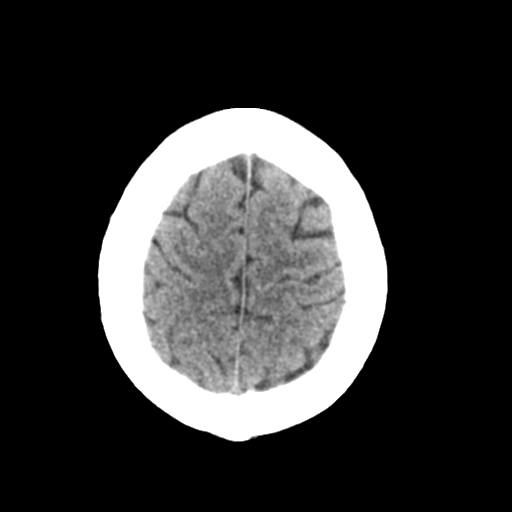
[im 24/28  brain]
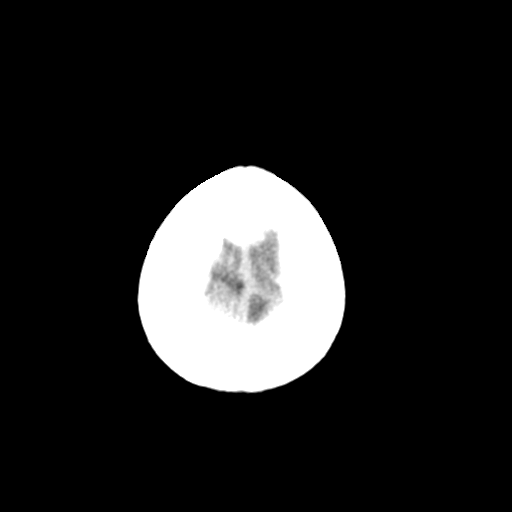

[Series 3: head bone · axial · 0.39mm/px · z∈[-168,-140]mm · 3 of 70 slices shown]
[im 7/70  bone]
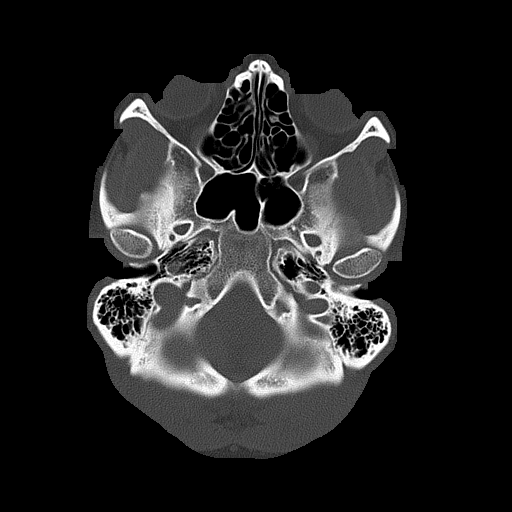
[im 14/70  bone]
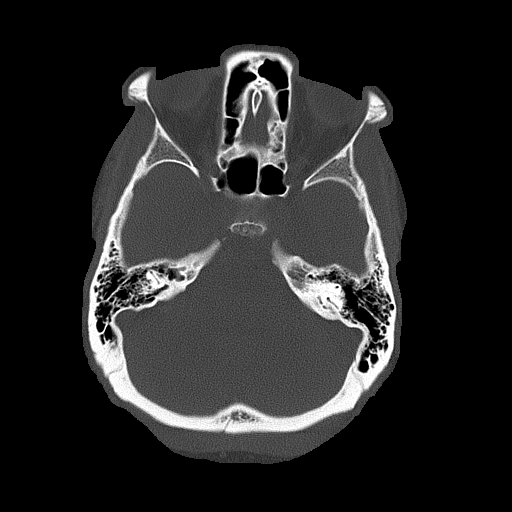
[im 21/70  bone]
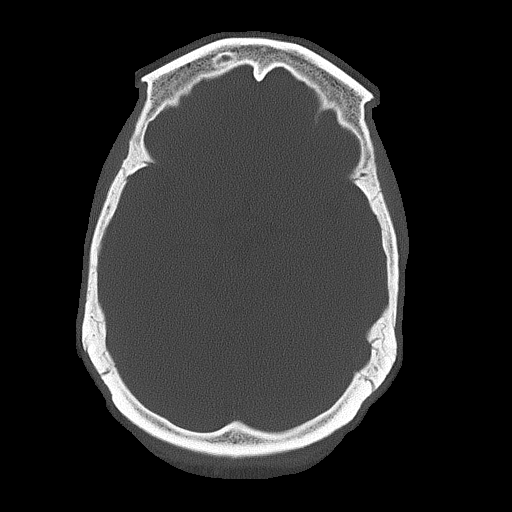

[Series 4: head without cor · coronal · non-contrast · 0.27mm/px · 3 of 67 slices shown]
[im 23/67  brain]
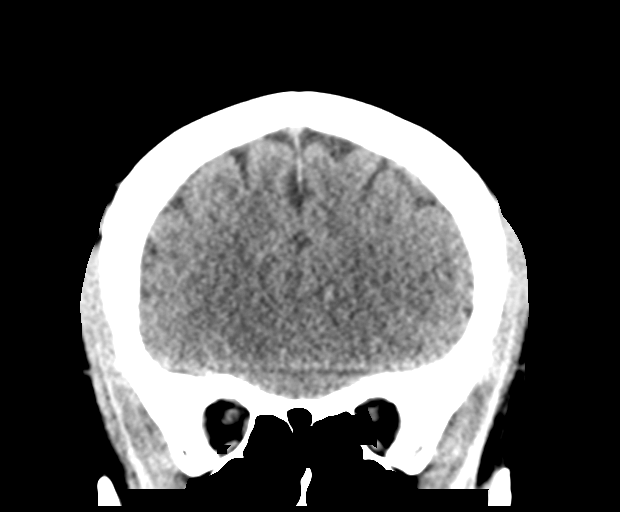
[im 30/67  brain]
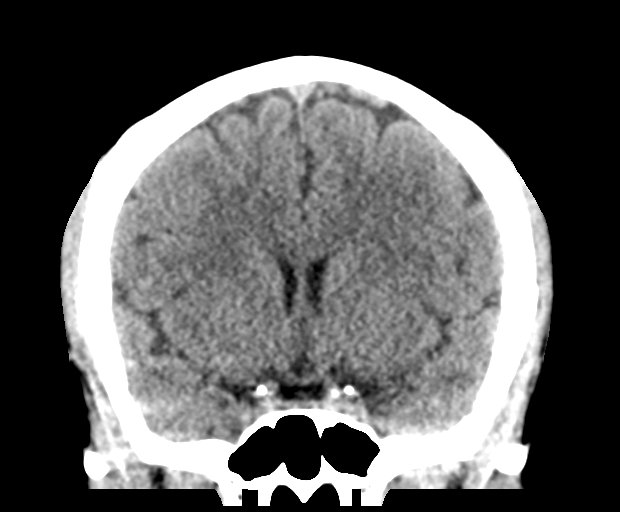
[im 37/67  brain]
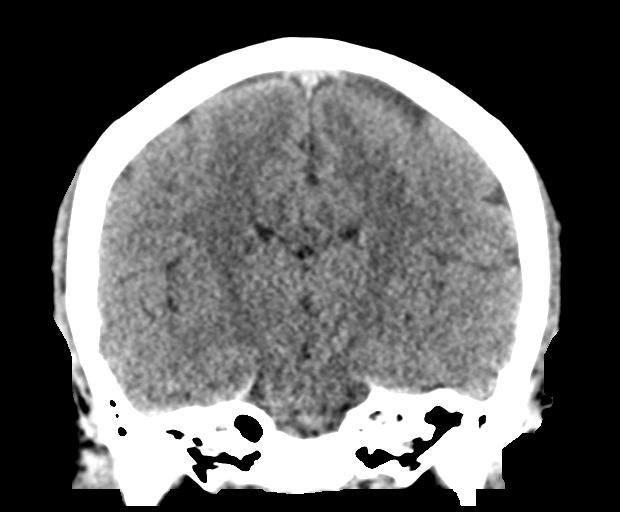

[Series 5: head without sag · sagittal · non-contrast · 0.27mm/px · 3 of 56 slices shown]
[im 19/56  brain]
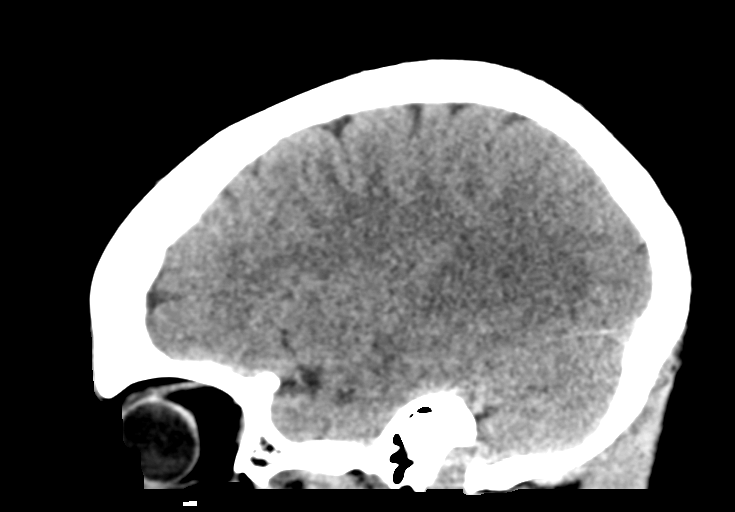
[im 28/56  brain]
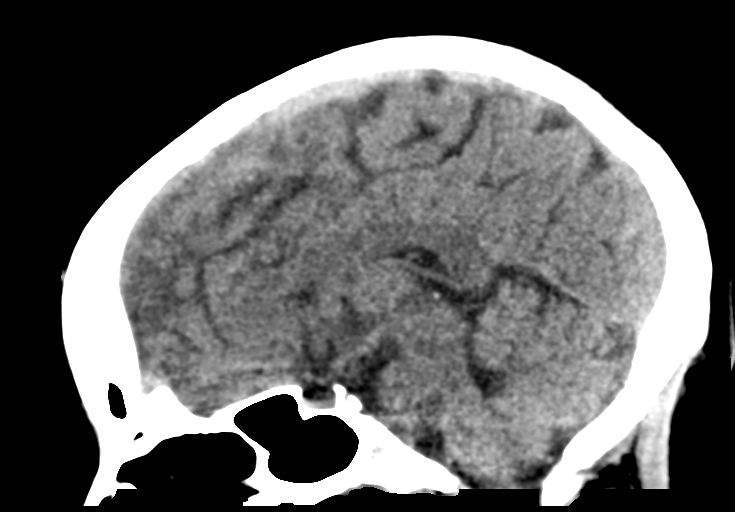
[im 37/56  brain]
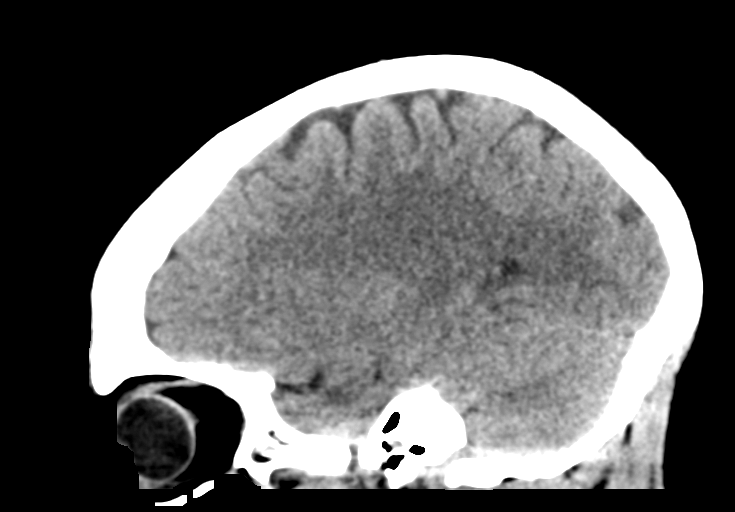

[16 of 47 positions shown; findings below may reference images not displayed]

FINDINGS: Brain: No acute intracranial abnormality. Specifically, no
hemorrhage, hydrocephalus, mass lesion, acute infarction, or
significant intracranial injury.

Vascular: No hyperdense vessel or unexpected calcification.

Skull: No acute calvarial abnormality.

Sinuses/Orbits: Visualized paranasal sinuses and mastoids clear.
Orbital soft tissues unremarkable.

Other: None
IMPRESSION: No acute intracranial abnormality.

## 2021-08-03 ENCOUNTER — Other Ambulatory Visit: Payer: Self-pay | Admitting: *Deleted

## 2021-08-03 ENCOUNTER — Other Ambulatory Visit: Payer: Self-pay | Admitting: Family Medicine

## 2021-08-03 ENCOUNTER — Ambulatory Visit: Payer: Medicaid Other | Attending: Family Medicine

## 2021-08-03 ENCOUNTER — Other Ambulatory Visit: Payer: Self-pay

## 2021-08-03 ENCOUNTER — Ambulatory Visit: Payer: Medicaid Other | Attending: Maternal & Fetal Medicine | Admitting: Maternal & Fetal Medicine

## 2021-08-03 DIAGNOSIS — O99330 Smoking (tobacco) complicating pregnancy, unspecified trimester: Secondary | ICD-10-CM

## 2021-08-03 DIAGNOSIS — Z3490 Encounter for supervision of normal pregnancy, unspecified, unspecified trimester: Secondary | ICD-10-CM | POA: Diagnosis not present

## 2021-08-03 DIAGNOSIS — O99320 Drug use complicating pregnancy, unspecified trimester: Secondary | ICD-10-CM | POA: Diagnosis not present

## 2021-08-03 DIAGNOSIS — O09529 Supervision of elderly multigravida, unspecified trimester: Secondary | ICD-10-CM

## 2021-08-03 DIAGNOSIS — O402XX0 Polyhydramnios, second trimester, not applicable or unspecified: Secondary | ICD-10-CM

## 2021-08-03 DIAGNOSIS — O409XX0 Polyhydramnios, unspecified trimester, not applicable or unspecified: Secondary | ICD-10-CM

## 2021-08-03 NOTE — Progress Notes (Signed)
Leah Walls is a 35 yo G8P7 at 25w 3d with a single intrauterine pregnancy here for a detailed anatomy due to substance abuse  Normal anatomy with measurements consistent with dates There is good fetal movement and polyhydramnios. Suboptimal views of the fetal anatomy were obtained secondary to fetal position.  I discussed with Ms. Nop the diagnosis of polyhydramnios defined as the MVP > 8 or AFI >24 cm. We discussed the common etiologies include idiopathic, gestational diabetes, aneupolidy, gastrointestinal conditions, cranial facial defects, and neuromuscular conditions. She has a normal early 1 hr GTT, NIPS and  negative horizon.  When the AFI is > 30 or > 11.9  we recommend weekly testing however, given mild polyhydramnios is observed we recommend repeat growth in 4 weeks.   If unresolved we recommend daily kick counts after 28 weeks and delivery at 39 weeks.    Follow up growth in 4 weeks.  I spent 30 minutes with > 50% in face to face in consultation.  Novella Olive, MD.

## 2021-08-08 ENCOUNTER — Telehealth: Payer: Self-pay | Admitting: Family Medicine

## 2021-08-08 NOTE — Telephone Encounter (Signed)
Attempted to call patient to discuss ultrasound results. Went straight to [employer]. Left HIPAA compliant message for her to call the office.  Anatomy scan shows polyhydramnios and she will need referral to high risk OB.

## 2021-08-09 DIAGNOSIS — Z87440 Personal history of urinary (tract) infections: Secondary | ICD-10-CM | POA: Insufficient documentation

## 2021-08-11 ENCOUNTER — Other Ambulatory Visit: Payer: Self-pay

## 2021-08-11 ENCOUNTER — Telehealth: Payer: Self-pay | Admitting: Family Medicine

## 2021-08-11 ENCOUNTER — Other Ambulatory Visit: Payer: Self-pay | Admitting: Family Medicine

## 2021-08-11 ENCOUNTER — Ambulatory Visit (INDEPENDENT_AMBULATORY_CARE_PROVIDER_SITE_OTHER): Payer: Medicaid Other | Admitting: Family Medicine

## 2021-08-11 VITALS — BP 102/60 | HR 91 | Wt 149.0 lb

## 2021-08-11 DIAGNOSIS — O409XX0 Polyhydramnios, unspecified trimester, not applicable or unspecified: Secondary | ICD-10-CM | POA: Diagnosis not present

## 2021-08-11 DIAGNOSIS — O24419 Gestational diabetes mellitus in pregnancy, unspecified control: Secondary | ICD-10-CM | POA: Diagnosis not present

## 2021-08-11 DIAGNOSIS — O402XX1 Polyhydramnios, second trimester, fetus 1: Secondary | ICD-10-CM

## 2021-08-11 DIAGNOSIS — Z87440 Personal history of urinary (tract) infections: Secondary | ICD-10-CM

## 2021-08-11 DIAGNOSIS — Z3490 Encounter for supervision of normal pregnancy, unspecified, unspecified trimester: Secondary | ICD-10-CM | POA: Diagnosis not present

## 2021-08-11 LAB — POCT 1 HR PRENATAL GLUCOSE: Glucose 1 Hr Prenatal, POC: 204 mg/dL

## 2021-08-11 MED ORDER — BLOOD GLUCOSE MONITOR KIT: PACK | 0 refills | Status: DC

## 2021-08-11 NOTE — Addendum Note (Signed)
Addended by: Burley Saver E on: 08/11/2021 10:34 AM   Modules accepted: Orders

## 2021-08-11 NOTE — Progress Notes (Addendum)
[facility] Visit  Leah Walls is a 35 y.o. [id number] at [redacted]w[redacted]d (via 12wk sono) who presents to [facility] for routine follow up. Prenatal course, history, notes, ultrasounds, and laboratory results reviewed.  Denies cramping/ctx, fluid leaking, vaginal bleeding, or decreased fetal movement. Taking PNV.    Primary Prenatal Care Provider: Dr Anner Crete  Postpartum Plans: - delivery planning: desires TOLAC - circumcision: female, desires circumcision - feeding: breast - pediatrician: [facility] - contraception: undecided, has tried Depo and Implanon in the past, discussed options today  FHR: 155 Uterine size: 25  Assessment & Plan  1. Routine prenatal care: - CBC. HIV, RPR today - discussed COVID vaccination, patient declined  2. Gestational diabetes- 1 hr GTT 204 today, diagnostic and no confirmatory 3 hr needed. Discussed with patient, glucometer test strips and lancets sent to pharmacy. Discussed BG goals < 95 FPG and < 140 1 hr PP glucose, discussed to record values and 1 week f/u with our pharmacy team to assess if not meeting goals. Discussed dietary changes. Referred to high risk OB today.  2. Polyhydramnios- MVP >8 on most recent ultrasound 08/03/21. MFM recommends repeat growth [place] in 4 weeks, scheduled for 09/02/21. Will refer to High risk OB today.  3. Urinary tract infection- repeat OB urine culture today for TOC. Patient asymptomatic today.  4. Tobacco use in pregnancy- stopped in first trimester. Congratulated patient.  5. 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 Has stopped. Congratulated patient.  6. 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  7. History of LTCS- referring to OB GYN for polyhydramnios, can discuss TOLAC with them as well. Currently desires TOLAC.   8. AMA- has had low risk NIPS and negative Horizon testing.  Next prenatal visit in 3 weeks . Labor & fetal movement precautions  discussed. - Will need Tdap at next visit  Burley Saver, MD [facility]

## 2021-08-11 NOTE — Addendum Note (Signed)
Addended by: Burley Saver E on: 08/11/2021 11:29 AM   Modules accepted: Orders

## 2021-08-11 NOTE — Patient Instructions (Addendum)
Your test showed you have gestational diabetes  Please check your blood sugar fasting daily and 1 hour after meals.  Your blood sugar goals should be: Fasting < 95 After meals < 140 (one hour after eating)  We have scheduled a follow up in one week if blood sugars are high to discuss starting medications    Pregnancy Related Return Precautions The follow are signs/symptoms that are abnormal in pregnancy and may require further evaluation by a physician: Go to the MAU at [facility] if: You have cramping/contractions that do not go away with drinking water, especially if they are lasting 30 seconds to 1.5 minutes, coming and going every 5-10 minutes for an hour or more, or are getting stronger and you cannot walk or talk while having a contraction/cramp. Your water breaks.  Sometimes it is a big gush of fluid, sometimes it is just a trickle that keeps getting your underwear wet or running down your legs You have vaginal bleeding.    You do not feel your baby moving like normal.  If you do not, get something to eat and drink (something cold or something with sugar like peanut butter or juice) and lay down and focus on feeling your baby move. If your baby is still not moving like normal, you should go to MAU. You should feel your baby move 6 times in one hour, or 10 times in two hours. You have a persistent headache that does not go away with 1 g of Tylenol, vision changes, chest pain, difficulty breathing, severe pain in your right upper abdomen, worsening leg swelling- these can all be signs of high blood pressure in pregnancy and need to be evaluated by a provider immediately  These are all concerning in pregnancy and if you have any of these I recommend you call your PCP and present to the Maternity Admissions Unit (map below) for further evaluation.  For any pregnancy-related emergencies, please go to the Maternity Admissions Unit in the [facility] at [facility]. You will use hospital Entrance C.

## 2021-08-11 NOTE — Telephone Encounter (Signed)
Pt stated she needs her glucometer called to pharmacy

## 2021-08-11 NOTE — Telephone Encounter (Signed)
Routed message to PCP and covering provider Dr. McDiarmid. Aquilla Solian, CMA

## 2021-08-12 LAB — CBC
Hematocrit: 28.5 % — ABNORMAL LOW (ref 34.0–46.6)
Hemoglobin: 9.5 g/dL — ABNORMAL LOW (ref 11.1–15.9)
MCH: 29.1 pg (ref 26.6–33.0)
MCHC: 33.3 g/dL (ref 31.5–35.7)
MCV: 87 fL (ref 79–97)
Platelets: 264 10*3/uL (ref 150–450)
RBC: 3.27 x10E6/uL — ABNORMAL LOW (ref 3.77–5.28)
RDW: 12.3 % (ref 11.7–15.4)
WBC: 7.4 10*3/uL (ref 3.4–10.8)

## 2021-08-12 LAB — RPR: RPR Ser Ql: NONREACTIVE

## 2021-08-12 LAB — HIV ANTIBODY (ROUTINE TESTING W REFLEX): HIV Screen 4th Generation wRfx: NONREACTIVE

## 2021-08-12 MED ORDER — BLOOD GLUCOSE MONITOR KIT: PACK | 0 refills | Status: DC

## 2021-08-12 NOTE — Addendum Note (Signed)
Addended by: Burley Saver E on: 08/12/2021 09:45 AM   Modules accepted: Orders

## 2021-08-13 LAB — URINE CULTURE, OB REFLEX

## 2021-08-13 LAB — CULTURE, OB URINE

## 2021-08-15 ENCOUNTER — Telehealth: Payer: Self-pay | Admitting: Family Medicine

## 2021-08-15 DIAGNOSIS — O99013 Anemia complicating pregnancy, third trimester: Secondary | ICD-10-CM

## 2021-08-15 MED ORDER — FERROUS SULFATE 325 (65 FE) MG PO TABS: 325.0000 mg | ORAL_TABLET | ORAL | 1 refills | Status: DC

## 2021-08-15 NOTE — Telephone Encounter (Signed)
Called and verified I was speaking with [name].  Discussed lab work from last week showing iron deficiency anemia of pregnancy, and discussed starting oral iron every other day. Sent to pharmacy. Discussed we would recheck levels in 4-6 weeks to see if responding appropriately.  She states she has been able to get her glucometer and check her blood sugars. Morning fasting have been around 97 and the after meals ranging 110-140s. Recommended writing these down and reminded her of her appointment with [place] on 08/18/21 at 0900 AM at the [facility].  Answered all questions and concerns.  Burley Saver MD

## 2021-08-18 ENCOUNTER — Ambulatory Visit (INDEPENDENT_AMBULATORY_CARE_PROVIDER_SITE_OTHER): Payer: Medicaid Other | Admitting: Pharmacist

## 2021-08-18 ENCOUNTER — Other Ambulatory Visit: Payer: Self-pay

## 2021-08-18 VITALS — BP 122/54 | Wt 146.8 lb

## 2021-08-18 DIAGNOSIS — O24419 Gestational diabetes mellitus in pregnancy, unspecified control: Secondary | ICD-10-CM

## 2021-08-18 NOTE — Progress Notes (Signed)
   Subjective:    Patient ID: Leah Walls, female    DOB: 12-Feb-1986, 35 y.o.   MRN: [id number]  HPI Patient is a 35 y.o. female who presents for gestational diabetes management. She is in good spirits and presents without assistance. Patient was referred and last seen by provider, Dr. Miquel Dunn, on 08/11/21.  Patient reports gestational diabetes was diagnosed in 2019.   Insurance coverage/medication affordability: Medicaid  Current diabetes medications include: None Patient states that She is taking her medications as prescribed. Patient reports adherence with medications.  Patient reported dietary habits:  Eats 1-2 meals/day and 2 snacks/day Breakfast: usually cereal around 8am Dinner: sometimes eats at 10pm  Snacks:12pm, 4-5pm  Home fasting blood sugars: 87, 88, 82, 96  1 hour post-meal blood sugars: 120, 106, 138 2 hour post-meal blood sugars: 101  Objective:   Labs:   Physical Exam Constitutional:      Appearance: Normal appearance.  Pulmonary:     Effort: Pulmonary effort is normal.  Neurological:     Mental Status: She is alert.    Review of Systems  All other systems reviewed and are negative.  Lab Results  Component Value Date   HGBA1C 5.9 (H) 09/11/2018    Vitals:   08/18/21 1104  BP: (!) 122/54    Assessment/Plan:   Gestational diabetes is controlled likely due to patient improving on diet since having elevated 1hr GTT on 08/11/21. Additional pharmacotherapy is not needed at this time as all blood glucose readings were within goal. Patient requested more detailed information on diet recommendations, provided information. Gave patient instructions to call clinic if blood glucose readings are above goals which were provided.  Follow-up video-visit appointment 09/05/2021 to review sugar readings unless patient able to see OB before then. Written patient instructions provided.  This appointment required 45 minutes of direct patient care.  Thank you for  involving pharmacy to assist in providing this patient's care.  Patient seen with Shellia Carwin, PharmD Candidate.

## 2021-08-18 NOTE — Patient Instructions (Addendum)
Your blood sugar goals are below. Please call us sooner than your next  appointment if your blood sugars start to go above goal. The clinic number is [phone]  Gestational Diabetes Mellitus, Self-Care Caring for yourself after a diagnosis of gestational diabetes mellitus means keeping your blood sugar under control. This can be done through nutrition, exercise, lifestyle changes, insulin and other medicines, and support from your health care team. Your health care provider will set individualized treatment goals for you. What are the risks? If left untreated, gestational diabetes can cause problems for mother and baby. For the mother Women who get gestational diabetes are more likely to: Have labor induced and deliver early. Have problems during labor and delivery, if the baby is larger than normal. This includes difficult labor and damage to the birth canal. Have a cesarean delivery. Have problems with blood pressure, including high blood pressure and preeclampsia. Get it again if they become pregnant. Develop type 2 diabetes in the future. For the baby Gestational diabetes that is not treated can cause the baby to have: Low blood glucose (hypoglycemia). Larger-than-normal body size (macrosomia). Breathing problems. How to monitor blood glucose Check your blood glucose every day and as often as told by your health care provider. To do this: Wash your hands with soap and water for at least 20 seconds. Prick the side of your finger (not the tip) with the lancet. Use a different finger each time. Gently rub the finger until a small drop of blood appears. Follow instructions that come with your meter for inserting the test strip, applying blood to the strip, and getting the result. Write down your result and any notes. Blood glucose goals are: 95 mg/dL (5.3 mmol/L) when fasting. 140 mg/dL (7.8 mmol/L) 1 hour after a meal. 120 mg/dL (6.7 mmol/L) 2 hours after a meal. Follow these  instructions at home: Medicines Take over-the-counter and prescription medicines only as told by your health care provider. If your health care provider prescribed insulin or other diabetes medicines: Take them every day. Do not run out of insulin or other medicines. Plan ahead so you always have them available. Eating and drinking  Follow instructions from your health care provider about eating or drinking restrictions. See a diet and nutrition expert (registered dietician) to help you create an eating plan that helps control your diabetes. The foods in this plan will include: Lean proteins. Complex carbohydrates. These are carbohydrates that contain fiber, have a lot of nutrients, and are digested slowly. They include dried beans, nuts, and whole grain breads, cereals, or pasta. Fresh fruits and vegetables. Low-fat dairy products. Healthy fats. Eat healthy snacks between nutritious meals. Drink enough fluid to keep your urine pale yellow. Keep a record of the carbohydrates that you eat. To do this: Read food labels. Learn the standard serving sizes of foods. Make a sick day plan with your health care provider before you get sick. Follow this plan whenever you cannot eat or drink as usual. Activity Do exercises as told by your health care provider. Do 30 or more minutes of physical activity a day, or as much physical activity as your health care provider recommends. It may help to control blood glucose levels after a meal if you: Do 10 minutes of exercise after each meal. Start this exercise 30 minutes after the meal. If you start a new exercise or activity, work with your health care provider to adjust your insulin, other medicines, or food as needed. Lifestyle Do not drink alcohol.  Do not use any products that contain nicotine or tobacco, such as cigarettes, e-cigarettes, and chewing tobacco. If you need help quitting, ask your health care provider. Learn to manage stress. If you  need help with this, ask your health care provider. Body care Keep your vaccines up to date. Practice good oral hygiene. To do this: Clean your teeth and gums two times a day. Floss one or more times a day. Visit your dentist one or more times every 6 months. Stay at a healthy weight while you are pregnant. Your expected weight gain depends on your BMI (body mass index) before pregnancy. General instructions Talk with your health care provider about the risk for high blood pressure during pregnancy (preeclampsia and eclampsia). Share your diabetes management plan with people in your workplace, school, and household. Check your urine for ketones when sick and as told by your health care provider. Ketones are made by the liver when a lack of glucose forces the body to use fat for energy. Carry a medical alert card or wear medical alert jewelry that says you have gestational diabetes. Keep all follow-up visits. This is important. Get care after delivery Have your blood glucose level checked with an oral glucose tolerance test (OGTT) 4-12 weeks after delivery. Get screened for diabetes at least every 3 years, or as often as told by your health care provider. Where to find more information American Diabetes Association (ADA): diabetes.org Association of Diabetes Care & Education Specialists (ADCES): diabeteseducator.org Centers for Disease Control and Prevention (CDC): TonerPromos.no American Pregnancy Association: americanpregnancy.org U.S. Department of Agriculture MyPlate: WrestlingReporter.dk Contact a health care provider if: Your blood glucose is above your target for two tests in a row. You have a fever. You have been sick for 2 days or more and are not getting better. You have either of these problems for more than 6 hours: Vomiting every time you eat or drink. Diarrhea. Get help right away if you: Become confused or cannot think clearly. Have trouble breathing. Have moderate or high ketones in  your urine. Feel your baby is not moving as usual. Develop unusual discharge or bleeding from your vagina. Start having early (premature) contractions. Contractions may feel like a tightening in your lower abdomen Have a severe headache. These symptoms may represent a serious problem that is an emergency. Do not wait to see if the symptoms will go away. Get medical help right away. Call your local emergency services (911 in the [place] Do not drive yourself to the hospital. Summary Check your blood glucose every day during your pregnancy. Do this as often as told by your health care provider. Take insulin or other diabetes medicines every day, if your health care provider prescribed them. Have your blood glucose level checked 4-12 weeks after delivery. Keep all follow-up visits. This is important. This information is not intended to replace advice given to you by your health care provider. Make sure you discuss any questions you have with your health care provider. Document Revised: 05/10/2020 Document Reviewed: 05/10/2020 Elsevier Patient Education  2022 Elsevier Inc.   Diet Recommendations for Diabetes  Carbohydrate includes starch, sugar, and fiber.  Of these, only sugar and starch raise blood glucose.  (Fiber is found in fruits, vegetables [especially skin, seeds, and stalks], whole grains, and beans.)   Starchy (carb) foods: Bread, rice, pasta, potatoes, corn, cereal, grits, crackers, bagels, muffins, all baked goods.  (Fruit, milk, and yogurt also have carbohydrate, but most of these foods will not spike your  blood sugar as most starchy or sweet foods will.)  A few fruits do cause high blood sugars; use small portions of bananas (limit to 1/2 at a time), grapes, watermelon, and oranges.   Protein foods: Meat, fish, poultry, eggs, dairy foods, and beans such as pinto and kidney beans (beans also provide carbohydrate).   1. Eat at least 3 REAL meals and 1-2 snacks per day. Eat breakfast  within the first hour of getting up.  Have something to eat at least every 5 hours while awake.   2. Limit starchy foods to TWO per meal and ONE per snack. ONE portion of a starchy food is equal to the following:   - ONE slice of bread (or its equivalent, such as half of a hamburger bun).   - 1/2 cup of a "scoopable" starchy food such as potatoes or rice.   - 15 grams of Total Carbohydrate as shown on food label.   - Every 4 ounces of a sweet drink (including fruit juice). 3. Include twice the volume of vegetables as protein or carbohydrate foods for BOTH lunch and dinner as often as you can.   - Fresh or frozen vegetables are best.   - Keep frozen vegetables on hand for a quick option.

## 2021-08-18 NOTE — Telephone Encounter (Signed)
Attempted to reach patient . No asnwer. LVM of note from doctor. Aquilla Solian, CMA

## 2021-08-25 ENCOUNTER — Encounter: Payer: Self-pay | Admitting: Family Medicine

## 2021-08-25 ENCOUNTER — Other Ambulatory Visit: Payer: Self-pay

## 2021-08-25 ENCOUNTER — Ambulatory Visit (INDEPENDENT_AMBULATORY_CARE_PROVIDER_SITE_OTHER): Payer: Medicaid Other | Admitting: Family Medicine

## 2021-08-25 VITALS — BP 100/60 | HR 97 | Wt 149.0 lb

## 2021-08-25 DIAGNOSIS — O2342 Unspecified infection of urinary tract in pregnancy, second trimester: Secondary | ICD-10-CM | POA: Diagnosis not present

## 2021-08-25 DIAGNOSIS — R399 Unspecified symptoms and signs involving the genitourinary system: Secondary | ICD-10-CM | POA: Diagnosis not present

## 2021-08-25 LAB — POCT URINALYSIS DIP (MANUAL ENTRY)
Bilirubin, UA: NEGATIVE
Glucose, UA: NEGATIVE mg/dL
Ketones, POC UA: NEGATIVE mg/dL
Nitrite, UA: NEGATIVE
Protein Ur, POC: 30 mg/dL — AB
Spec Grav, UA: 1.025 (ref 1.010–1.025)
Urobilinogen, UA: 0.2 E.U./dL
pH, UA: 6 (ref 5.0–8.0)

## 2021-08-25 LAB — POCT UA - MICROSCOPIC ONLY

## 2021-08-25 MED ORDER — CEPHALEXIN 250 MG PO CAPS: 250.0000 mg | ORAL_CAPSULE | Freq: Four times a day (QID) | ORAL | 0 refills | Status: AC

## 2021-08-25 NOTE — Patient Instructions (Addendum)
Thank you for coming to see me today. It was a pleasure. Today we talked about:   Take Keflex 1 tablet breakfast, lunch, dinner and before bedtime for 10 days.  Follow up with PCP if symptoms do not improve, fever, or return after treatment  Please follow-up with PCP as needed  If you have any questions or concerns, please do not hesitate to call the office at [phone].  Best,   Dana Allan, MD    Pregnancy and Urinary Tract Infection A urinary tract infection (UTI) is an infection of any part of the urinary tract. This includes the kidneys, the tubes that connect your kidneys to your bladder (ureters), the bladder, and the tube that carries urine out of your body (urethra). These organs make, store, and get rid of urine in the body. Your health care provider may use other names to describe the infection. An upper UTI affects the ureters and kidneys (pyelonephritis). A lower UTI affects the bladder (cystitis) and urethra (urethritis). Most urinary tract infections are caused by bacteria in your genital area, around the entrance to your urinary tract (urethra). These bacteria grow and cause irritation and inflammation of your urinary tract. You are more likely to develop a UTI during pregnancy because the physical and hormonal changes your body goes through can make it easier for bacteria to get into your urinary tract. Your growing baby also puts pressure on your bladder and can affect urine flow. It is important to recognize and treat UTIs in pregnancy because of the risk of serious complications for both you and your baby. How does this affect me? Symptoms of a UTI include: Needing to urinate right away (urgently). Frequent urination or passing small amounts of urine frequently. Pain or burning with urination. Blood in the urine. Urine that smells bad or unusual. Trouble urinating. Cloudy urine. Pain in the abdomen or lower back. Vaginal discharge. You may also have: Vomiting or a  decreased appetite. Confusion. Irritability or tiredness. A fever. Diarrhea. How does this affect my baby? An untreated UTI during pregnancy could lead to a kidney infection or a systemic infection, which can cause health problems that could affect your baby. Possible complications of an untreated UTI include: Giving birth to your baby before 37 weeks of pregnancy (premature). Having a baby with a low birth weight. Developing high blood pressure during pregnancy (preeclampsia). Having a low hemoglobin level (anemia). What can I do to lower my risk? To prevent a UTI: Go to the bathroom as soon as you feel the need. Do not hold urine for long periods of time. Always wipe from front to back, especially after a bowel movement. Use each tissue one time when you wipe. Empty your bladder after sex. Keep your genital area dry. Drink 6-10 glasses of water each day. Do not douche or use deodorant sprays. How is this treated? Treatment for this condition may include: Antibiotic medicines that are safe to take during pregnancy. Other medicines to treat less common causes of UTI. Follow these instructions at home: If you were prescribed an antibiotic medicine, take it as told by your health care provider. Do not stop using the antibiotic even if you start to feel better. Keep all follow-up visits as told by your health care provider. This is important. Contact a health care provider if: Your symptoms do not improve or they get worse. You have abnormal vaginal discharge. Get help right away if you: Have a fever. Have nausea and vomiting. Have back or  side pain. Feel contractions in your uterus. Have lower belly pain. Have a gush of fluid from your vagina. Have blood in your urine. Summary A urinary tract infection (UTI) is an infection of any part of the urinary tract, which includes the kidneys, ureters, bladder, and urethra. Most urinary tract infections are caused by bacteria in your  genital area, around the entrance to your urinary tract (urethra). You are more likely to develop a UTI during pregnancy. If you were prescribed an antibiotic medicine, take it as told by your health care provider. Do not stop using the antibiotic even if you start to feel better. This information is not intended to replace advice given to you by your health care provider. Make sure you discuss any questions you have with your health care provider. Document Revised: 03/28/2019 Document Reviewed: 11/07/2018 Elsevier Patient Education  2022 [employer].

## 2021-08-25 NOTE — Progress Notes (Signed)
    SUBJECTIVE:   CHIEF COMPLAINT / HPI: urine infection  Symptoms started 2 days ago.  Dysuria and urinary frequency. Denies any fevers, contractions, abdominal pain,vaginal bleeding or  gush of fluid. Sexually active yesterday, no condom use.  Recently treated 1 month ago for similar symptoms with Keflex.     PERTINENT  PMH / PSH:  [redacted] weeks gestation GDM   OBJECTIVE:   BP 100/60   Pulse 97   Wt 149 lb (67.6 kg)   LMP 01/21/2021   SpO2 99%   BMI 30.09 kg/m    General: Alert, no acute distress Cardio: Normal S1 and S2, RRR, no r/m/g Pulm: CTAB, normal work of breathing Abdomen: Bowel sounds normal. Abdomen soft and non-tender.   ASSESSMENT/PLAN:   UTI (urinary tract infection) during pregnancy, second trimester Symptomatic UTI. Offered STI testing, patient declined. U/A positive large amount of leukocytes Start Keflex 250 mg QID x 10 days Urine OB culture Follow up with PCP if no improvement in symptoms     Dana Allan, MD [facility]

## 2021-08-25 NOTE — Progress Notes (Signed)
r 

## 2021-08-28 ENCOUNTER — Encounter: Payer: Self-pay | Admitting: Family Medicine

## 2021-08-28 DIAGNOSIS — O2342 Unspecified infection of urinary tract in pregnancy, second trimester: Secondary | ICD-10-CM | POA: Insufficient documentation

## 2021-08-28 NOTE — Assessment & Plan Note (Addendum)
Symptomatic UTI. Offered STI testing, patient declined. U/A positive large amount of leukocytes Start Keflex 250 mg QID x 10 days Urine OB culture Follow up with PCP if no improvement in symptoms

## 2021-08-30 LAB — URINE CULTURE, OB REFLEX

## 2021-08-30 LAB — CULTURE, OB URINE

## 2021-09-01 ENCOUNTER — Telehealth: Payer: Self-pay

## 2021-09-02 ENCOUNTER — Ambulatory Visit: Payer: Medicaid Other

## 2021-09-02 ENCOUNTER — Telehealth: Payer: Self-pay

## 2021-09-02 NOTE — Telephone Encounter (Signed)
9/16 8:50am left message for patient to call office to reschedule for next Tuesday or Wednesday.  If not rescheduled there will be delays today.

## 2021-09-05 ENCOUNTER — Other Ambulatory Visit: Payer: Self-pay

## 2021-09-05 ENCOUNTER — Telehealth: Payer: Medicaid Other | Admitting: Pharmacist

## 2021-09-06 ENCOUNTER — Ambulatory Visit: Payer: Medicaid Other | Attending: Maternal & Fetal Medicine | Admitting: *Deleted

## 2021-09-06 ENCOUNTER — Ambulatory Visit (HOSPITAL_BASED_OUTPATIENT_CLINIC_OR_DEPARTMENT_OTHER): Payer: Medicaid Other

## 2021-09-06 ENCOUNTER — Other Ambulatory Visit: Payer: Self-pay

## 2021-09-06 ENCOUNTER — Ambulatory Visit: Payer: Medicaid Other | Admitting: Family Medicine

## 2021-09-06 ENCOUNTER — Encounter: Payer: Self-pay | Admitting: *Deleted

## 2021-09-06 VITALS — BP 104/53 | HR 87

## 2021-09-06 DIAGNOSIS — B009 Herpesviral infection, unspecified: Secondary | ICD-10-CM | POA: Insufficient documentation

## 2021-09-06 DIAGNOSIS — O99333 Smoking (tobacco) complicating pregnancy, third trimester: Secondary | ICD-10-CM | POA: Insufficient documentation

## 2021-09-06 DIAGNOSIS — O24419 Gestational diabetes mellitus in pregnancy, unspecified control: Secondary | ICD-10-CM | POA: Diagnosis not present

## 2021-09-06 DIAGNOSIS — O09523 Supervision of elderly multigravida, third trimester: Secondary | ICD-10-CM | POA: Insufficient documentation

## 2021-09-06 DIAGNOSIS — Z3A30 30 weeks gestation of pregnancy: Secondary | ICD-10-CM | POA: Diagnosis not present

## 2021-09-06 DIAGNOSIS — O24410 Gestational diabetes mellitus in pregnancy, diet controlled: Secondary | ICD-10-CM

## 2021-09-06 DIAGNOSIS — O409XX0 Polyhydramnios, unspecified trimester, not applicable or unspecified: Secondary | ICD-10-CM

## 2021-09-06 DIAGNOSIS — O09293 Supervision of pregnancy with other poor reproductive or obstetric history, third trimester: Secondary | ICD-10-CM | POA: Insufficient documentation

## 2021-09-06 DIAGNOSIS — O99323 Drug use complicating pregnancy, third trimester: Secondary | ICD-10-CM | POA: Diagnosis not present

## 2021-09-06 DIAGNOSIS — F1910 Other psychoactive substance abuse, uncomplicated: Secondary | ICD-10-CM | POA: Diagnosis not present

## 2021-09-06 DIAGNOSIS — O98513 Other viral diseases complicating pregnancy, third trimester: Secondary | ICD-10-CM | POA: Diagnosis not present

## 2021-09-06 DIAGNOSIS — O403XX0 Polyhydramnios, third trimester, not applicable or unspecified: Secondary | ICD-10-CM | POA: Insufficient documentation

## 2021-09-07 ENCOUNTER — Other Ambulatory Visit: Payer: Self-pay | Admitting: *Deleted

## 2021-09-07 DIAGNOSIS — Z3689 Encounter for other specified antenatal screening: Secondary | ICD-10-CM

## 2021-09-07 DIAGNOSIS — O24410 Gestational diabetes mellitus in pregnancy, diet controlled: Secondary | ICD-10-CM

## 2021-09-07 DIAGNOSIS — O09523 Supervision of elderly multigravida, third trimester: Secondary | ICD-10-CM

## 2021-09-09 ENCOUNTER — Encounter: Payer: Medicaid Other | Admitting: Family Medicine

## 2021-09-10 ENCOUNTER — Other Ambulatory Visit: Payer: Self-pay | Admitting: Family Medicine

## 2021-10-03 ENCOUNTER — Ambulatory Visit (INDEPENDENT_AMBULATORY_CARE_PROVIDER_SITE_OTHER): Payer: Medicaid Other | Admitting: Family Medicine

## 2021-10-03 ENCOUNTER — Other Ambulatory Visit: Payer: Self-pay

## 2021-10-03 VITALS — BP 108/69 | HR 105 | Wt 149.9 lb

## 2021-10-03 DIAGNOSIS — Z8619 Personal history of other infectious and parasitic diseases: Secondary | ICD-10-CM

## 2021-10-03 DIAGNOSIS — Z8632 Personal history of gestational diabetes: Secondary | ICD-10-CM

## 2021-10-03 DIAGNOSIS — O409XX0 Polyhydramnios, unspecified trimester, not applicable or unspecified: Secondary | ICD-10-CM | POA: Diagnosis not present

## 2021-10-03 DIAGNOSIS — O2342 Unspecified infection of urinary tract in pregnancy, second trimester: Secondary | ICD-10-CM

## 2021-10-03 DIAGNOSIS — O0993 Supervision of high risk pregnancy, unspecified, third trimester: Secondary | ICD-10-CM

## 2021-10-03 MED ORDER — VALACYCLOVIR HCL 500 MG PO TABS: 500.0000 mg | ORAL_TABLET | Freq: Two times a day (BID) | ORAL | 1 refills | Status: AC

## 2021-10-03 NOTE — Progress Notes (Signed)
  [facility] Prenatal Visit  Leah Walls is a 35 y.o. [id number] at [redacted]w[redacted]d here for routine follow up. She is dated by early ultrasound.  She reports no complaints.  She reports fetal movement. She denies vaginal bleeding, contractions, or loss of fluid.  See flow sheet for details.  Vitals:   10/03/21 0925  BP: 108/69  Pulse: (!) 105   A/P: Pregnancy at [redacted]w[redacted]d.  Doing well.   Routine prenatal care:  Dating reviewed, dating tab is correct Fetal heart tones: Appropriate150 Fundal height: within expected range. 33 The patient does not have a history of HSV and valacyclovir is not indicated at this time; RX to pharmacy to start at 36 weeks.  The patient has a history of Cesarean delivery and consult to [facility] placed for discussion, signing of consents, and scheduling delivery.  Infant feeding choice: Breastfeeding Contraception choice: Undecided  Infant circumcision desired yes Influenza vaccine- declined  Tdap was not given today. COVID vaccination was discussed and declined.  Childbirth and education classes were not offered. Pregnancy education regarding benefits of breastfeeding, contraception, fetal growth, expected weight gain, and safe infant sleep were discussed.  Preterm labor and fetal movement precautions reviewed.   2. Pregnancy issues include the following and were addressed as appropriate today:  High-risk pregnancy in third trimester Doing well today no concerns. -Referred to OBGYN in August -Weekly BPP until delivery  UTI (urinary tract infection) during pregnancy, second trimester Tx 08/26/2019. Asymptomatic today. Need repeat culture today.  - Culture, OB Urine  History of herpes genitalis Needs ppx starting @36  wks rx to pharmacy with appropriate start date of 10/30.  - valACYclovir (VALTREX) 500 MG tablet; Take 1 tablet (500 mg total) by mouth 2 (two) times daily.  Dispense: 30 tablet; Refill: 1  History of gestational  diabetes Diet controlled. CBG avg. 120. Dr. 11/30 saw in 9/1 and CBGs well controlled then.  -MFM serial scans -OBGYN referral  Polyhydramnios affecting pregnancy 09/06/21 09/08/21 w/ normal AFI -Weekly BPP until delivery   Problem list and pregnancy box updated: Yes.   Discussed referral with patient and referral coordinator. She needs to be scheduled with OBGYN ASAP. Has MFM follow up scheduled.   Follow up 2 weeks.

## 2021-10-03 NOTE — Patient Instructions (Addendum)
You should receive a call to schedule your next prenatal visit with her OB/GYN.  Please let us know if you have not received a call about in a week.  In the meantime please continue to check your blood sugars.  I have also sent in Valtrex for you to start at 36 weeks.  Continue to make sure that baby is moving.  With any leakage of fluid vaginal bleeding or you do not feel as if baby is moving as much please go to the maternity assessment unit at [facility].  Dr. Salvadore Dom

## 2021-10-04 ENCOUNTER — Encounter: Payer: Self-pay | Admitting: *Deleted

## 2021-10-05 ENCOUNTER — Ambulatory Visit: Payer: Medicaid Other | Admitting: *Deleted

## 2021-10-05 ENCOUNTER — Other Ambulatory Visit: Payer: Self-pay

## 2021-10-05 ENCOUNTER — Encounter: Payer: Self-pay | Admitting: *Deleted

## 2021-10-05 ENCOUNTER — Ambulatory Visit: Payer: Medicaid Other | Attending: Obstetrics and Gynecology

## 2021-10-05 VITALS — BP 101/62 | HR 90

## 2021-10-05 DIAGNOSIS — O09529 Supervision of elderly multigravida, unspecified trimester: Secondary | ICD-10-CM

## 2021-10-05 DIAGNOSIS — O24410 Gestational diabetes mellitus in pregnancy, diet controlled: Secondary | ICD-10-CM

## 2021-10-05 DIAGNOSIS — O09523 Supervision of elderly multigravida, third trimester: Secondary | ICD-10-CM | POA: Diagnosis not present

## 2021-10-05 DIAGNOSIS — Z3689 Encounter for other specified antenatal screening: Secondary | ICD-10-CM

## 2021-10-05 DIAGNOSIS — O99323 Drug use complicating pregnancy, third trimester: Secondary | ICD-10-CM | POA: Diagnosis not present

## 2021-10-05 DIAGNOSIS — O09293 Supervision of pregnancy with other poor reproductive or obstetric history, third trimester: Secondary | ICD-10-CM | POA: Diagnosis not present

## 2021-10-05 DIAGNOSIS — Z3A34 34 weeks gestation of pregnancy: Secondary | ICD-10-CM

## 2021-10-05 DIAGNOSIS — F1990 Other psychoactive substance use, unspecified, uncomplicated: Secondary | ICD-10-CM

## 2021-10-05 LAB — URINE CULTURE, OB REFLEX

## 2021-10-05 LAB — CULTURE, OB URINE

## 2021-10-10 ENCOUNTER — Other Ambulatory Visit: Payer: Self-pay | Admitting: Family Medicine

## 2021-10-10 DIAGNOSIS — Z8619 Personal history of other infectious and parasitic diseases: Secondary | ICD-10-CM

## 2021-10-11 ENCOUNTER — Ambulatory Visit: Payer: Medicaid Other

## 2021-10-12 ENCOUNTER — Ambulatory Visit: Payer: Medicaid Other | Attending: Obstetrics and Gynecology

## 2021-10-12 ENCOUNTER — Other Ambulatory Visit: Payer: Self-pay

## 2021-10-12 ENCOUNTER — Encounter: Payer: Self-pay | Admitting: *Deleted

## 2021-10-12 ENCOUNTER — Ambulatory Visit: Payer: Medicaid Other | Admitting: *Deleted

## 2021-10-12 ENCOUNTER — Other Ambulatory Visit: Payer: Self-pay | Admitting: *Deleted

## 2021-10-12 VITALS — BP 100/77 | HR 122

## 2021-10-12 DIAGNOSIS — O24410 Gestational diabetes mellitus in pregnancy, diet controlled: Secondary | ICD-10-CM

## 2021-10-12 DIAGNOSIS — Z3A35 35 weeks gestation of pregnancy: Secondary | ICD-10-CM

## 2021-10-12 DIAGNOSIS — Z3689 Encounter for other specified antenatal screening: Secondary | ICD-10-CM | POA: Insufficient documentation

## 2021-10-12 DIAGNOSIS — O09523 Supervision of elderly multigravida, third trimester: Secondary | ICD-10-CM | POA: Diagnosis not present

## 2021-10-12 DIAGNOSIS — O99333 Smoking (tobacco) complicating pregnancy, third trimester: Secondary | ICD-10-CM | POA: Diagnosis not present

## 2021-10-12 DIAGNOSIS — O24419 Gestational diabetes mellitus in pregnancy, unspecified control: Secondary | ICD-10-CM

## 2021-10-12 DIAGNOSIS — O09213 Supervision of pregnancy with history of pre-term labor, third trimester: Secondary | ICD-10-CM | POA: Diagnosis not present

## 2021-10-12 DIAGNOSIS — O09293 Supervision of pregnancy with other poor reproductive or obstetric history, third trimester: Secondary | ICD-10-CM

## 2021-10-18 ENCOUNTER — Ambulatory Visit: Payer: Medicaid Other | Admitting: *Deleted

## 2021-10-18 ENCOUNTER — Other Ambulatory Visit: Payer: Self-pay

## 2021-10-18 ENCOUNTER — Ambulatory Visit: Payer: Medicaid Other | Attending: Obstetrics and Gynecology

## 2021-10-18 VITALS — BP 114/58 | HR 87

## 2021-10-18 DIAGNOSIS — Z3A36 36 weeks gestation of pregnancy: Secondary | ICD-10-CM

## 2021-10-18 DIAGNOSIS — O09293 Supervision of pregnancy with other poor reproductive or obstetric history, third trimester: Secondary | ICD-10-CM

## 2021-10-18 DIAGNOSIS — Z3689 Encounter for other specified antenatal screening: Secondary | ICD-10-CM | POA: Insufficient documentation

## 2021-10-18 DIAGNOSIS — O09213 Supervision of pregnancy with history of pre-term labor, third trimester: Secondary | ICD-10-CM

## 2021-10-18 DIAGNOSIS — O09523 Supervision of elderly multigravida, third trimester: Secondary | ICD-10-CM | POA: Diagnosis not present

## 2021-10-18 DIAGNOSIS — O24410 Gestational diabetes mellitus in pregnancy, diet controlled: Secondary | ICD-10-CM | POA: Insufficient documentation

## 2021-10-25 ENCOUNTER — Ambulatory Visit: Payer: Medicaid Other | Admitting: *Deleted

## 2021-10-25 ENCOUNTER — Encounter: Payer: Self-pay | Admitting: *Deleted

## 2021-10-25 ENCOUNTER — Ambulatory Visit: Payer: Medicaid Other | Attending: Obstetrics and Gynecology

## 2021-10-25 ENCOUNTER — Encounter: Payer: Self-pay | Admitting: Family Medicine

## 2021-10-25 ENCOUNTER — Other Ambulatory Visit (HOSPITAL_COMMUNITY)
Admission: RE | Admit: 2021-10-25 | Discharge: 2021-10-25 | Disposition: A | Discharge status: Home / Self Care | Payer: Medicaid Other | Source: Ambulatory Visit | Attending: Family Medicine | Admitting: Family Medicine

## 2021-10-25 ENCOUNTER — Other Ambulatory Visit: Payer: Self-pay

## 2021-10-25 ENCOUNTER — Ambulatory Visit (INDEPENDENT_AMBULATORY_CARE_PROVIDER_SITE_OTHER): Payer: Medicaid Other | Admitting: Family Medicine

## 2021-10-25 ENCOUNTER — Other Ambulatory Visit: Payer: Self-pay | Admitting: Obstetrics and Gynecology

## 2021-10-25 VITALS — BP 110/56 | HR 94

## 2021-10-25 VITALS — BP 110/78 | HR 60 | Wt 150.4 lb

## 2021-10-25 DIAGNOSIS — O24419 Gestational diabetes mellitus in pregnancy, unspecified control: Secondary | ICD-10-CM | POA: Diagnosis not present

## 2021-10-25 DIAGNOSIS — O09523 Supervision of elderly multigravida, third trimester: Secondary | ICD-10-CM | POA: Insufficient documentation

## 2021-10-25 DIAGNOSIS — O0990 Supervision of high risk pregnancy, unspecified, unspecified trimester: Secondary | ICD-10-CM | POA: Diagnosis not present

## 2021-10-25 DIAGNOSIS — Z3490 Encounter for supervision of normal pregnancy, unspecified, unspecified trimester: Secondary | ICD-10-CM | POA: Insufficient documentation

## 2021-10-25 DIAGNOSIS — A6000 Herpesviral infection of urogenital system, unspecified: Secondary | ICD-10-CM | POA: Diagnosis not present

## 2021-10-25 DIAGNOSIS — Z98891 History of uterine scar from previous surgery: Secondary | ICD-10-CM | POA: Diagnosis not present

## 2021-10-25 DIAGNOSIS — Z3A37 37 weeks gestation of pregnancy: Secondary | ICD-10-CM

## 2021-10-25 DIAGNOSIS — N898 Other specified noninflammatory disorders of vagina: Secondary | ICD-10-CM | POA: Insufficient documentation

## 2021-10-25 DIAGNOSIS — O99213 Obesity complicating pregnancy, third trimester: Secondary | ICD-10-CM | POA: Diagnosis not present

## 2021-10-25 DIAGNOSIS — O99323 Drug use complicating pregnancy, third trimester: Secondary | ICD-10-CM

## 2021-10-25 DIAGNOSIS — O409XX0 Polyhydramnios, unspecified trimester, not applicable or unspecified: Secondary | ICD-10-CM | POA: Diagnosis not present

## 2021-10-25 NOTE — Patient Instructions (Signed)

## 2021-10-25 NOTE — Progress Notes (Signed)
Subjective:   Leah Walls is a 35 y.o. [id number] at 59w2dby early ultrasound being seen today for her first obstetrical visit.  Her obstetrical history is significant for advanced maternal age and [id number] grand multiparity, hx of CS with VBAC x6 . Patient does intend to breast feed. Pregnancy history fully reviewed.  Patient reports no complaints.  [facility]   [place] wondering about the possibility of a home birth.   HISTORY: OB History  Gravida Para Term Preterm AB Living  8 7 6 1  0 7  SAB IAB Ectopic Multiple Live Births  0 0 0 0 7    # Outcome Date GA Lbr Len/2nd Weight Sex Delivery Anes PTL Lv  8 Current           7 Term 10/29/18 374w0d1:59 / 00:03 6 lb 11.9 oz (3.059 kg) F VBAC EPI  LIV     Birth Comments: wnl     Name: Rothman,GIRL Valari     Apgar1: 7  Apgar5: 8  6 Term 02/06/17 4015w3d:23 / 00:05 7 lb 3 oz (3.26 kg) M VBAC EPI  LIV     Name: Musson,BOY Demiah     Apgar1: 9  Apgar5: 9  5 Term 07/07/15 39w46w1d20 / 00:20 6 lb 14.1 oz (3.12 kg) F VBAC EPI  LIV     Apgar1: 9  Apgar5: 9  4 Term 03/11/09    F VBAC   LIV  3 Preterm 12/01/06 36w084w0dVBAC   LIV  2 Term 04/08/05    M VBAC     1 Term 02/01/03    M CS-LTranv   LIV     Last pap smear: Lab Results  Component Value Date   DIAGPAP  06/01/2020    - Negative for Intraepithelial Lesions or Malignancy (NILM)   DIAGPAP  06/01/2020    - Benign reactive/reparative changes including parakeratosis   HPVHIGH Negative 06/01/2020    Past Medical History:  Diagnosis Date   GERD (gastroesophageal reflux disease)    Gestational diabetes    Headache    Hernia, abdominal 2011   HSV (herpes simplex virus) infection    Supervision of other normal pregnancy, antepartum 05/17/2018    Nursing Staff Provider Office Location  [facility] Dating   Language  English Anatomy US  [place] female Flu Vaccine  Declined  Genetic Screen   NIPS: low risk  TDaP  vaccine   08/27/18 Hgb A1C or  GTT Early  Third trimester: GDM  Rhogam  n/a   LAB RESULTS  Feeding Plan Breast Blood Type O/Positive/-- (05/31 1059) O pos Contraception nexplanon Antibody Negative (05/31 1059)neg Circumcision Female  Rub   Past Surgical History:  Procedure Laterality Date   CESAREAN SECTION     DILATION AND EVACUATION Bilateral 07/14/2015   Procedure: DILATATION AND EVACUATION;  Surgeon: BernaFrederico Hamman  [facility]  Service: Gynecology;  Laterality: Bilateral;   FOREARM FRACTURE SURGERY Right 2008   WISDOM TOOTH EXTRACTION     Family History  Problem Relation Age of Onset   Hypertension Mother    Hypertension Maternal Grandmother    Hypertension Paternal Grandmother    Hypertension Father    Colon cancer Neg Hx    Esophageal cancer Neg Hx    Rectal cancer Neg Hx    Social History   Tobacco Use   Smoking status: Former  Packs/day: 0.50    Types: Cigarettes    Quit date: 02/10/2021    Years since quitting: 0.7   Smokeless tobacco: Never  Vaping Use   Vaping Use: Never used  Substance Use Topics   Alcohol use: No   Drug use: No   No Known Allergies Current Outpatient Medications on File Prior to Visit  Medication Sig Dispense Refill   ACCU-CHEK GUIDE test strip TEST UP TO 4 TIMES DAILY 100 strip 4   albuterol (VENTOLIN HFA) 108 (90 Base) MCG/ACT inhaler Inhale 2 puffs into the lungs every 6 (six) hours as needed for wheezing or shortness of breath. 18 g 2   aspirin EC 81 MG tablet Take 1 tablet (81 mg total) by mouth daily. Swallow whole. 30 tablet 8   blood glucose meter kit and supplies KIT Dispense based on patient and insurance preference. Use up to four times daily as directed. 1 each 0   blood glucose meter kit and supplies KIT Dispense based on patient and insurance preference. Use up to four times daily as directed. 1 each 0   omeprazole (PRILOSEC) 40 MG capsule TAKE 1 CAPSULE BY MOUTH EVERY DAY 90 capsule 1   Prenat-FeAsp-Meth-FA-DHA  w/o A (PRENATE PIXIE) 10-0.6-0.4-200 MG CAPS Take 1 tablet by mouth daily. 30 capsule 8   valACYclovir (VALTREX) 500 MG tablet Take 1 tablet (500 mg total) by mouth 2 (two) times daily. 30 tablet 1   [DISCONTINUED] amLODipine (NORVASC) 5 MG tablet Take 1 tablet (5 mg total) by mouth daily. 30 tablet 0   [DISCONTINUED] promethazine (PHENERGAN) 25 MG tablet Take 1 tablet (25 mg total) by mouth every 6 (six) hours as needed for nausea or vomiting. (Patient not taking: Reported on 12/05/2018) 30 tablet 0   No current facility-administered medications on file prior to visit.     Exam   Vitals:   10/25/21 0957  BP: 110/78  Pulse: 60  Weight: 150 lb 6.4 oz (68.2 kg)   Fetal Heart Rate (bpm): 140  Dilation: 1.5 Effacement (%): Thick Station: -3 Presentation: Vertex   System: General: well-developed, well-nourished female in no acute distress   Skin: normal coloration and turgor, no rashes   Neurologic: oriented, normal, negative, normal mood   Extremities: normal strength, tone, and muscle mass, ROM of all joints is normal   HEENT PERRLA, extraocular movement intact and sclera clear, anicteric   Neck supple and no masses   Respiratory:  no respiratory distress      Assessment:   Pregnancy: [id number] Patient Active Problem List   Diagnosis Date Noted   UTI (urinary tract infection) during pregnancy, second trimester 08/28/2021   Polyhydramnios affecting pregnancy 08/11/2021   History of urinary tract infection 08/09/2021   History of herpes genitalis 06/03/2021   Pregnancy headache in second trimester 05/27/2021   Supervision of high risk pregnancy, antepartum 05/03/2021   Tobacco abuse 06/01/2020   Gastroesophageal reflux disease without esophagitis 09/19/2019   GDM, class A1 08/29/2018   High-risk pregnancy in third trimester 32/20/2542   Umbilical hernia 70/62/3762   Genital herpes 10/18/2016   Marijuana use 07/30/2016   History of VBAC x4 07/24/2016   Breast lump on  right side at 1 o'clock position 07/24/2016     Plan:  1. History of VBAC x6 Engaged with patient in discussion around home birth Primarily wondering if this is possible due to GDM Recommended against this given she is a TOLAC and grand multip with possibility of hemorrhage Patient disappointed with this  but understands why I am making this recommendation Patient's doula also seems receptive to this recommendation Discussed we can still do a relatively low intervention birth if that is her desire - Cervicovaginal ancillary only - Culture, beta strep (group b only)  2. Encounter for supervision of low-risk pregnancy, antepartum BP and FHR normal today Labs reviewed, up to date Swabs today Discussed possible IOL, see discussion below Cervix still thick, posterior - Cervicovaginal ancillary only - Culture, beta strep (group b only)  3. Vaginal irritation Reports possible yeast infection, swab obtained - Cervicovaginal ancillary only  4. Vaginal itching  - Cervicovaginal ancillary only  5. Polyhydramnios affecting pregnancy Resolved on most recent scan  6. Genital herpes simplex, unspecified site On valtrex  7. Supervision of high risk pregnancy, antepartum   8. GDM, class A1 Reports fastings around high 90's to low 100's Post prandials usually 120-130's Endorses dietary indiscrection Discussed that given her sugars are above goal we would recommend IOL around 38-39 weeks Patient strongly desires to delay IOL until her due date, her sister who recently passed also had this due date Discussed that if her sugars are much improved at next visit would be reasonable to go until 40 weeks Growth [place] have been normal, last done 10/05/2021, EFW 2289g, 28%, AFI 10   Routine obstetric precautions reviewed. Return in 1 week (on 11/01/2021).

## 2021-10-26 LAB — CERVICOVAGINAL ANCILLARY ONLY
Bacterial Vaginitis (gardnerella): POSITIVE — AB
Candida Glabrata: NEGATIVE
Candida Vaginitis: POSITIVE — AB
Chlamydia: NEGATIVE
Comment: NEGATIVE
Comment: NEGATIVE
Comment: NEGATIVE
Comment: NEGATIVE
Comment: NEGATIVE
Comment: NORMAL
Neisseria Gonorrhea: NEGATIVE
Trichomonas: NEGATIVE

## 2021-10-27 MED ORDER — METRONIDAZOLE 0.75 % VA GEL: 1.0000 | Freq: Every day | VAGINAL | 0 refills | Status: AC

## 2021-10-27 MED ORDER — FLUCONAZOLE 150 MG PO TABS: 150.0000 mg | ORAL_TABLET | Freq: Once | ORAL | 0 refills | Status: AC

## 2021-10-27 NOTE — Addendum Note (Signed)
Addended by: Merian Capron on: 10/27/2021 08:51 AM   Modules accepted: Orders

## 2021-10-29 LAB — CULTURE, BETA STREP (GROUP B ONLY): Strep Gp B Culture: NEGATIVE

## 2021-11-03 ENCOUNTER — Encounter: Payer: Self-pay | Admitting: *Deleted

## 2021-11-03 ENCOUNTER — Ambulatory Visit (INDEPENDENT_AMBULATORY_CARE_PROVIDER_SITE_OTHER): Payer: Medicaid Other | Admitting: Family Medicine

## 2021-11-03 ENCOUNTER — Other Ambulatory Visit: Payer: Self-pay

## 2021-11-03 ENCOUNTER — Ambulatory Visit (HOSPITAL_BASED_OUTPATIENT_CLINIC_OR_DEPARTMENT_OTHER): Payer: Medicaid Other | Admitting: *Deleted

## 2021-11-03 ENCOUNTER — Ambulatory Visit: Payer: Medicaid Other | Attending: Obstetrics | Admitting: *Deleted

## 2021-11-03 VITALS — BP 108/74 | HR 84 | Wt 152.0 lb

## 2021-11-03 VITALS — BP 104/60 | HR 79

## 2021-11-03 DIAGNOSIS — O09523 Supervision of elderly multigravida, third trimester: Secondary | ICD-10-CM | POA: Diagnosis not present

## 2021-11-03 DIAGNOSIS — O24410 Gestational diabetes mellitus in pregnancy, diet controlled: Secondary | ICD-10-CM | POA: Insufficient documentation

## 2021-11-03 DIAGNOSIS — Z8619 Personal history of other infectious and parasitic diseases: Secondary | ICD-10-CM

## 2021-11-03 DIAGNOSIS — O99323 Drug use complicating pregnancy, third trimester: Secondary | ICD-10-CM | POA: Diagnosis not present

## 2021-11-03 DIAGNOSIS — O0990 Supervision of high risk pregnancy, unspecified, unspecified trimester: Secondary | ICD-10-CM

## 2021-11-03 DIAGNOSIS — Z3A38 38 weeks gestation of pregnancy: Secondary | ICD-10-CM

## 2021-11-03 DIAGNOSIS — O409XX0 Polyhydramnios, unspecified trimester, not applicable or unspecified: Secondary | ICD-10-CM

## 2021-11-03 DIAGNOSIS — O24419 Gestational diabetes mellitus in pregnancy, unspecified control: Secondary | ICD-10-CM

## 2021-11-03 DIAGNOSIS — O99320 Drug use complicating pregnancy, unspecified trimester: Secondary | ICD-10-CM

## 2021-11-03 DIAGNOSIS — Z98891 History of uterine scar from previous surgery: Secondary | ICD-10-CM

## 2021-11-03 NOTE — Procedures (Signed)
Leah Walls 1986-01-20 [redacted]w[redacted]d  Fetus A Non-Stress Test Interpretation for 11/03/21  Indication:  GDM,AMA, substance abuse  Fetal Heart Rate A Mode: External Baseline Rate (A): 140 bpm Variability: Moderate Accelerations: 15 x 15 Decelerations: Variable Multiple birth?: No  Uterine Activity Mode: Palpation, Toco Contraction Frequency (min): none Resting Tone Palpated: Relaxed  Interpretation (Fetal Testing) Nonstress Test Interpretation: Reactive Overall Impression: Reassuring for gestational age Comments: Dr. Judeth Cornfield reviewed tracing

## 2021-11-03 NOTE — Progress Notes (Signed)
   PRENATAL VISIT NOTE  Subjective:  Leah Walls is a 35 y.o. [phone] at [redacted]w[redacted]d being seen today for ongoing prenatal care.  She is currently monitored for the following issues for this high-risk pregnancy and has History of VBAC x6; Breast lump on right side at 1 o'clock position; Marijuana use; Genital herpes; Umbilical hernia; GDM, class A1; Gastroesophageal reflux disease without esophagitis; Tobacco abuse; Supervision of high risk pregnancy, antepartum; Pregnancy headache in second trimester; History of herpes genitalis; History of urinary tract infection; Polyhydramnios affecting pregnancy; and UTI (urinary tract infection) during pregnancy, second trimester on their problem list.  Patient reports no complaints.  Contractions: Irritability. Vag. Bleeding: None.  Movement: Present. Denies leaking of fluid.   The following portions of the patient's history were reviewed and updated as appropriate: allergies, current medications, past family history, past medical history, past social history, past surgical history and problem list.   Objective:   Vitals:   11/03/21 1434  BP: 108/74  Pulse: 84  Weight: 152 lb (68.9 kg)    Fetal Status: Fetal Heart Rate (bpm): 147 Fundal Height: 33 cm Movement: Present  Presentation: Vertex  General:  Alert, oriented and cooperative. Patient is in no acute distress.  Skin: Skin is warm and dry. No rash noted.   Cardiovascular: Normal heart rate noted  Respiratory: Normal respiratory effort, no problems with respiration noted  Abdomen: Soft, gravid, appropriate for gestational age.  Pain/Pressure: Present     Pelvic: Cervical exam performed in the presence of a chaperone Dilation: 1.5 Effacement (%): Thick Station: -3  Extremities: Normal range of motion.  Edema: None  Mental Status: Normal mood and affect. Normal behavior. Normal judgment and thought content.   Assessment and Plan:  Pregnancy: [id number] at [redacted]w[redacted]d 1. GDM, class A1 Meter reviewed. Few  130s, and high 120s but most are in the 70-90 range. Knows of her dietary indiscretions Last growth 3017 gm 6 lbs 10 oz (43%)  2. History of VBAC x6 For IOL at 40 wks  3. Supervision of high risk pregnancy, antepartum  4. History of herpes genitalis On Valtrex  Term labor symptoms and general obstetric precautions including but not limited to vaginal bleeding, contractions, leaking of fluid and fetal movement were reviewed in detail with the patient. Please refer to After Visit Summary for other counseling recommendations.   Return in 1 week (on 11/10/2021).  Future Appointments  Date Time Provider Department Center  11/09/2021  1:00 PM [facility] NURSE [facility]  11/09/2021  1:15 PM [facility] NST [facility]  11/13/2021 12:00 AM [facility] SCHED ROOM [facility] None    Reva Bores, MD

## 2021-11-07 ENCOUNTER — Other Ambulatory Visit: Payer: Self-pay | Admitting: Advanced Practice Midwife

## 2021-11-09 ENCOUNTER — Ambulatory Visit: Payer: Medicaid Other | Admitting: *Deleted

## 2021-11-09 ENCOUNTER — Other Ambulatory Visit: Payer: Self-pay

## 2021-11-09 ENCOUNTER — Ambulatory Visit: Payer: Medicaid Other | Attending: Obstetrics | Admitting: *Deleted

## 2021-11-09 ENCOUNTER — Encounter: Payer: Self-pay | Admitting: *Deleted

## 2021-11-09 VITALS — BP 109/65 | HR 97

## 2021-11-09 DIAGNOSIS — Z3A39 39 weeks gestation of pregnancy: Secondary | ICD-10-CM | POA: Diagnosis not present

## 2021-11-09 DIAGNOSIS — O24419 Gestational diabetes mellitus in pregnancy, unspecified control: Secondary | ICD-10-CM | POA: Insufficient documentation

## 2021-11-09 DIAGNOSIS — O09523 Supervision of elderly multigravida, third trimester: Secondary | ICD-10-CM | POA: Insufficient documentation

## 2021-11-09 DIAGNOSIS — O24410 Gestational diabetes mellitus in pregnancy, diet controlled: Secondary | ICD-10-CM | POA: Diagnosis not present

## 2021-11-09 DIAGNOSIS — O99320 Drug use complicating pregnancy, unspecified trimester: Secondary | ICD-10-CM

## 2021-11-09 NOTE — Procedures (Signed)
Leah Walls [birth year] [redacted]w[redacted]d  Fetus A Non-Stress Test Interpretation for 11/09/21  Indication:  AMA, GDM, substance abuse  Fetal Heart Rate A Mode: External Baseline Rate (A): 140 bpm Variability: Moderate Accelerations: 15 x 15 Decelerations: None Multiple birth?: No  Uterine Activity Mode: Palpation, Toco Contraction Frequency (min): 1 uc Contraction Duration (sec): 130 Contraction Quality: Mild Resting Tone Palpated: Relaxed Resting Time: Adequate  Interpretation (Fetal Testing) Nonstress Test Interpretation: Reactive Overall Impression: Reassuring for gestational age Comments: Dr. Judeth Cornfield reviewed tracing

## 2021-11-11 ENCOUNTER — Inpatient Hospital Stay (HOSPITAL_COMMUNITY)
Admission: EM | Admit: 2021-11-11 | Discharge: 2021-11-12 | DRG: 806 | Disposition: A | Discharge status: Home / Self Care | Payer: Medicaid Other | Attending: Obstetrics and Gynecology | Admitting: Obstetrics and Gynecology

## 2021-11-11 ENCOUNTER — Encounter (HOSPITAL_COMMUNITY): Payer: Self-pay | Admitting: Obstetrics and Gynecology

## 2021-11-11 ENCOUNTER — Other Ambulatory Visit: Payer: Self-pay

## 2021-11-11 DIAGNOSIS — O24419 Gestational diabetes mellitus in pregnancy, unspecified control: Secondary | ICD-10-CM

## 2021-11-11 DIAGNOSIS — A6000 Herpesviral infection of urogenital system, unspecified: Secondary | ICD-10-CM | POA: Diagnosis present

## 2021-11-11 DIAGNOSIS — Z3A39 39 weeks gestation of pregnancy: Secondary | ICD-10-CM | POA: Diagnosis not present

## 2021-11-11 DIAGNOSIS — O0940 Supervision of pregnancy with grand multiparity, unspecified trimester: Secondary | ICD-10-CM

## 2021-11-11 DIAGNOSIS — O34219 Maternal care for unspecified type scar from previous cesarean delivery: Secondary | ICD-10-CM | POA: Diagnosis not present

## 2021-11-11 DIAGNOSIS — Z7982 Long term (current) use of aspirin: Secondary | ICD-10-CM

## 2021-11-11 DIAGNOSIS — Z20822 Contact with and (suspected) exposure to covid-19: Secondary | ICD-10-CM | POA: Diagnosis present

## 2021-11-11 DIAGNOSIS — O9832 Other infections with a predominantly sexual mode of transmission complicating childbirth: Secondary | ICD-10-CM | POA: Diagnosis present

## 2021-11-11 DIAGNOSIS — O26893 Other specified pregnancy related conditions, third trimester: Secondary | ICD-10-CM | POA: Diagnosis not present

## 2021-11-11 DIAGNOSIS — Z87891 Personal history of nicotine dependence: Secondary | ICD-10-CM

## 2021-11-11 DIAGNOSIS — D62 Acute posthemorrhagic anemia: Secondary | ICD-10-CM | POA: Diagnosis not present

## 2021-11-11 DIAGNOSIS — O9081 Anemia of the puerperium: Secondary | ICD-10-CM | POA: Diagnosis not present

## 2021-11-11 DIAGNOSIS — O34211 Maternal care for low transverse scar from previous cesarean delivery: Secondary | ICD-10-CM | POA: Diagnosis not present

## 2021-11-11 DIAGNOSIS — O24420 Gestational diabetes mellitus in childbirth, diet controlled: Principal | ICD-10-CM | POA: Diagnosis present

## 2021-11-11 HISTORY — DX: Supervision of pregnancy with grand multiparity, unspecified trimester: O09.40

## 2021-11-11 HISTORY — DX: Gestational diabetes mellitus in pregnancy, unspecified control: O24.419

## 2021-11-11 LAB — CBC
HCT: 35 % — ABNORMAL LOW (ref 36.0–46.0)
Hemoglobin: 11.4 g/dL — ABNORMAL LOW (ref 12.0–15.0)
MCH: 29.8 pg (ref 26.0–34.0)
MCHC: 32.6 g/dL (ref 30.0–36.0)
MCV: 91.4 fL (ref 80.0–100.0)
Platelets: 223 10*3/uL (ref 150–400)
RBC: 3.83 MIL/uL — ABNORMAL LOW (ref 3.87–5.11)
RDW: 14.1 % (ref 11.5–15.5)
WBC: 10.4 10*3/uL (ref 4.0–10.5)
nRBC: 0 % (ref 0.0–0.2)

## 2021-11-11 LAB — RESP PANEL BY RT-PCR (FLU A&B, COVID) ARPGX2
Influenza A by PCR: NEGATIVE
Influenza B by PCR: NEGATIVE
SARS Coronavirus 2 by RT PCR: NEGATIVE

## 2021-11-11 LAB — TYPE AND SCREEN
ABO/RH(D): O POS
Antibody Screen: NEGATIVE

## 2021-11-11 MED ORDER — COCONUT OIL OIL: 1.0000 "application " | TOPICAL_OIL | Status: DC | PRN

## 2021-11-11 MED ORDER — OXYCODONE HCL 5 MG PO TABS
5.0000 mg | ORAL_TABLET | ORAL | Status: DC | PRN
  Administered 2021-11-11 – 2021-11-12 (×2): 5 mg via ORAL
  Filled 2021-11-11 (×2): qty 1

## 2021-11-11 MED ORDER — LACTATED RINGERS IV SOLN: INTRAVENOUS | Status: DC

## 2021-11-11 MED ORDER — PRENATAL MULTIVITAMIN CH
1.0000 | ORAL_TABLET | Freq: Every day | ORAL | Status: DC
  Filled 2021-11-11: qty 1

## 2021-11-11 MED ORDER — ACETAMINOPHEN 325 MG PO TABS
650.0000 mg | ORAL_TABLET | ORAL | Status: DC | PRN
  Administered 2021-11-11 – 2021-11-12 (×2): 650 mg via ORAL
  Filled 2021-11-11 (×2): qty 2

## 2021-11-11 MED ORDER — OXYTOCIN-SODIUM CHLORIDE 30-0.9 UT/500ML-% IV SOLN
250.0000 mL/h | INTRAVENOUS | Status: DC
Start: 2021-11-11 — End: 2021-11-11
  Administered 2021-11-11: 250 mL/h via INTRAVENOUS

## 2021-11-11 MED ORDER — LACTATED RINGERS IV SOLN
500.0000 mL | INTRAVENOUS | Status: DC | PRN
Start: 2021-11-11 — End: 2021-11-11

## 2021-11-11 MED ORDER — SIMETHICONE 80 MG PO CHEW: 80.0000 mg | CHEWABLE_TABLET | ORAL | Status: DC | PRN

## 2021-11-11 MED ORDER — ONDANSETRON HCL 4 MG PO TABS
4.0000 mg | ORAL_TABLET | ORAL | Status: DC | PRN
  Administered 2021-11-12: 4 mg via ORAL
  Filled 2021-11-11: qty 1

## 2021-11-11 MED ORDER — SENNOSIDES-DOCUSATE SODIUM 8.6-50 MG PO TABS
2.0000 | ORAL_TABLET | Freq: Every day | ORAL | Status: DC
  Administered 2021-11-12: 2 via ORAL
  Filled 2021-11-11: qty 2

## 2021-11-11 MED ORDER — DIPHENHYDRAMINE HCL 25 MG PO CAPS: 25.0000 mg | ORAL_CAPSULE | Freq: Four times a day (QID) | ORAL | Status: DC | PRN

## 2021-11-11 MED ORDER — OXYTOCIN BOLUS FROM INFUSION: 333.0000 mL | Freq: Once | INTRAVENOUS | Status: DC

## 2021-11-11 MED ORDER — OXYTOCIN-SODIUM CHLORIDE 30-0.9 UT/500ML-% IV SOLN: 2.5000 [IU]/h | INTRAVENOUS | Status: DC

## 2021-11-11 MED ORDER — DIBUCAINE (PERIANAL) 1 % EX OINT: 1.0000 "application " | TOPICAL_OINTMENT | CUTANEOUS | Status: DC | PRN

## 2021-11-11 MED ORDER — WITCH HAZEL-GLYCERIN EX PADS: 1.0000 "application " | MEDICATED_PAD | CUTANEOUS | Status: DC | PRN

## 2021-11-11 MED ORDER — ZOLPIDEM TARTRATE 5 MG PO TABS: 5.0000 mg | ORAL_TABLET | Freq: Every evening | ORAL | Status: DC | PRN

## 2021-11-11 MED ORDER — FENTANYL CITRATE (PF) 100 MCG/2ML IJ SOLN
100.0000 ug | Freq: Once | INTRAMUSCULAR | Status: AC
  Administered 2021-11-11: 100 ug via INTRAVENOUS

## 2021-11-11 MED ORDER — TETANUS-DIPHTH-ACELL PERTUSSIS 5-2.5-18.5 LF-MCG/0.5 IM SUSY
0.5000 mL | PREFILLED_SYRINGE | Freq: Once | INTRAMUSCULAR | Status: DC
  Filled 2021-11-11: qty 0.5

## 2021-11-11 MED ORDER — IBUPROFEN 600 MG PO TABS
600.0000 mg | ORAL_TABLET | Freq: Four times a day (QID) | ORAL | Status: DC
  Administered 2021-11-11 – 2021-11-12 (×3): 600 mg via ORAL
  Filled 2021-11-11 (×3): qty 1

## 2021-11-11 MED ORDER — ONDANSETRON HCL 4 MG/2ML IJ SOLN: 4.0000 mg | INTRAMUSCULAR | Status: DC | PRN

## 2021-11-11 MED ORDER — BENZOCAINE-MENTHOL 20-0.5 % EX AERO
1.0000 "application " | INHALATION_SPRAY | CUTANEOUS | Status: DC | PRN
  Filled 2021-11-11: qty 56

## 2021-11-11 MED ORDER — FENTANYL CITRATE (PF) 100 MCG/2ML IJ SOLN
INTRAMUSCULAR | Status: AC
  Filled 2021-11-11: qty 2

## 2021-11-11 NOTE — ED Provider Notes (Addendum)
[facility] EMERGENCY DEPARTMENT Provider Note   CSN: [id number] Arrival date & time: 11/11/21  1803     History Chief Complaint  Patient presents with   Delivering Baby    Leah Walls is a 35 y.o. female.  Patient in third trimester pregnancy, presents to ER chief complaint of feeling the baby being born.  She states that she feels the baby pushing to come out.  In triage her water broke and the baby's head was already crowning.  Patient otherwise denies any fevers.  Denies any vomiting or diarrhea.      Past Medical History:  Diagnosis Date   GERD (gastroesophageal reflux disease)    Gestational diabetes    Headache    Hernia, abdominal 2011   HSV (herpes simplex virus) infection    Supervision of other normal pregnancy, antepartum 05/17/2018    Nursing Staff Provider Office Location  [facility] Dating   Language  English Anatomy US  Normal female Flu Vaccine  Declined  Genetic Screen   NIPS: low risk  TDaP vaccine   08/27/18 Hgb A1C or  GTT Early  Third trimester: GDM  Rhogam  n/a   LAB RESULTS  Feeding Plan Breast Blood Type O/Positive/-- (05/31 1059) O pos Contraception nexplanon Antibody Negative (05/31 1059)neg Circumcision Female  Rub    Patient Active Problem List   Diagnosis Date Noted   UTI (urinary tract infection) during pregnancy, second trimester 08/28/2021   Polyhydramnios affecting pregnancy 08/11/2021   History of urinary tract infection 08/09/2021   History of herpes genitalis 06/03/2021   Pregnancy headache in second trimester 05/27/2021   Supervision of high risk pregnancy, antepartum 05/03/2021   Tobacco abuse 06/01/2020   Gastroesophageal reflux disease without esophagitis 09/19/2019   GDM, class A1 41/32/4401   Umbilical hernia 02/72/5366   Genital herpes 10/18/2016   Marijuana use 07/30/2016   History of VBAC x6 07/24/2016   Breast lump on right side at 1 o'clock position 07/24/2016    Past Surgical History:  Procedure  Laterality Date   CESAREAN SECTION     DILATION AND EVACUATION Bilateral 07/14/2015   Procedure: DILATATION AND EVACUATION;  Surgeon: Frederico Hamman, MD;  Location: [facility];  Service: Gynecology;  Laterality: Bilateral;   FOREARM FRACTURE SURGERY Right 2008   WISDOM TOOTH EXTRACTION       OB History     Gravida  8   Para  7   Term  6   Preterm  1   AB  0   Living  7      SAB  0   IAB  0   Ectopic  0   Multiple  0   Live Births  7           Family History  Problem Relation Age of Onset   Hypertension Mother    Hypertension Maternal Grandmother    Hypertension Paternal Grandmother    Hypertension Father    Colon cancer Neg Hx    Esophageal cancer Neg Hx    Rectal cancer Neg Hx     Social History   Tobacco Use   Smoking status: Former    Packs/day: 0.50    Types: Cigarettes    Quit date: 02/10/2021    Years since quitting: 0.7   Smokeless tobacco: Never  Vaping Use   Vaping Use: Never used  Substance Use Topics   Alcohol use: No   Drug use: No    Home Medications Prior to  Admission medications   Medication Sig Start Date End Date Taking? Authorizing Provider  ACCU-CHEK GUIDE test strip TEST UP TO 4 TIMES DAILY 09/12/21   Kinnie Feil, MD  aspirin EC 81 MG tablet Take 1 tablet (81 mg total) by mouth daily. Swallow whole. 05/03/21   Alcus Dad, MD  blood glucose meter kit and supplies KIT Dispense based on patient and insurance preference. Use up to four times daily as directed. 08/12/21   McDiarmid, Blane Ohara, MD  blood glucose meter kit and supplies KIT Dispense based on patient and insurance preference. Use up to four times daily as directed. 08/12/21   Lenoria Chime, MD  omeprazole (PRILOSEC) 40 MG capsule TAKE 1 CAPSULE BY MOUTH EVERY DAY 06/06/21   Zenia Resides, MD  Prenat-FeAsp-Meth-FA-DHA w/o A (PRENATE PIXIE) 10-0.6-0.4-200 MG CAPS Take 1 tablet by mouth daily. 04/07/21   Zenia Resides, MD  valACYclovir (VALTREX) 500 MG  tablet Take 1 tablet (500 mg total) by mouth 2 (two) times daily. 10/16/21 11/15/21  Autry-Lott, Naaman Plummer, DO  amLODipine (NORVASC) 5 MG tablet Take 1 tablet (5 mg total) by mouth daily. 12/05/18 08/22/19  Rasch, Anderson Malta I, NP  promethazine (PHENERGAN) 25 MG tablet Take 1 tablet (25 mg total) by mouth every 6 (six) hours as needed for nausea or vomiting. Patient not taking: Reported on 12/05/2018 10/25/18 08/22/19  Osborne Oman, MD    Allergies    Patient has no known allergies.  Review of Systems   Review of Systems  Constitutional:  Negative for fever.  HENT:  Negative for ear pain.   Eyes:  Negative for pain.  Respiratory:  Negative for cough.   Cardiovascular:  Negative for chest pain.  Gastrointestinal:  Negative for abdominal pain.  Genitourinary:  Negative for flank pain.  Musculoskeletal:  Negative for back pain.  Skin:  Negative for rash.  Neurological:  Negative for headaches.   Physical Exam Updated Vital Signs BP 123/74   Pulse 95   Temp (!) 96.8 F (36 C) (Tympanic)   Resp (!) 22   LMP 01/21/2021   SpO2 99%   Physical Exam Constitutional:      General: She is not in acute distress.    Appearance: Normal appearance.  HENT:     Head: Normocephalic.     Nose: Nose normal.  Eyes:     Extraocular Movements: Extraocular movements intact.  Cardiovascular:     Rate and Rhythm: Normal rate.  Pulmonary:     Effort: Pulmonary effort is normal.  Abdominal:     Comments: Gravid abdomen  Musculoskeletal:        General: Normal range of motion.     Cervical back: Normal range of motion.  Neurological:     General: No focal deficit present.     Mental Status: She is alert. Mental status is at baseline.    ED Results / Procedures / Treatments   Labs (all labs ordered are listed, but only abnormal results are displayed) Labs Reviewed - No data to display  EKG None  Radiology No results found.  Procedures OB Delivery  Date/Time: 11/11/2021 6:31  PM Performed by: Luna Fuse, MD Authorized by: Luna Fuse, MD   Consent Done?:  Emergent Situation Delivery Summary For::  Mother Procedure Details - Mother:    Presentation::  Occiput anterior   Amniotic fluid:  Clear   Shoulder dystocia:  None   Episiotomy:  None   Cord Complication::  Nuchal  Number of loops::  1   Cord Around::  Neck   Child Living status::  Yes   Placenta Delivered::  No   Est. Blood Loss (mL)::  Minimal   Medications Ordered in ED Medications  oxytocin (PITOCIN) IV infusion 30 units in NS 500 mL - Premix (250 mL/hr Intravenous New Bag/Given 11/11/21 1810)    ED Course  I have reviewed the triage vital signs and the nursing notes.  Pertinent labs & imaging results that were available during my care of the patient were reviewed by me and considered in my medical decision making (see chart for details).    MDM Rules/Calculators/A&P                           I was rushed to patient's bedside.  On arrival baby's head was already crowning.  Umbilical cord was wrapped around the neck, this was gently unwrapped with 2 fingers.  On the next push the mother was able to fully birth the baby.  Baby was grayish appearing on birth, normal respiratory effort.  Good muscle tone, spontaneous breathing and spontaneous crying present.  Skin color improved to a brownish color within the first 1 to 2 minutes.  Small amount of vaginal bleeding noted.  Umbilical cord and placenta left in place for OB to deliver.    Final Clinical Impression(s) / ED Diagnoses Final diagnoses:  Vaginal delivery    Rx / DC Orders ED Discharge Orders     None        Luna Fuse, MD 11/11/21 1816    Luna Fuse, MD 11/11/21 Manon Hilding, MD 11/11/21 [phone]

## 2021-11-11 NOTE — ED Triage Notes (Signed)
Patient brought in with baby crowning. Placed on ED stretcher and EDP at bedside delivering baby. Patient reports 8th pregnancy. Baby delivered at 1800.

## 2021-11-11 NOTE — H&P (Signed)
OBSTETRIC ADMISSION HISTORY AND PHYSICAL  Leah Walls is a 35 y.o. female [phone] with IUP at 20w5dby LMP presenting for spontaneous onset of [place] She plans on breast feeding. She request POP for birth control. [facility]  Dating: By LMP --->  Estimated Date of Delivery: 11/13/21  Sono:    @[redacted]w[redacted]d , CWD, normal anatomy, cephalic presentation, 30737T 43% EFW   Prenatal History/Complications: HSV on suppression, Hx c/s with 6 VBAC, A1GDM  Past Medical History: Past Medical History:  Diagnosis Date   GERD (gastroesophageal reflux disease)    Gestational diabetes    Headache    Hernia, abdominal 2011   HSV (herpes simplex virus) infection    Supervision of other normal pregnancy, antepartum 05/17/2018    Nursing Staff Provider Office Location  [facility] Dating   Language  English Anatomy [place] Normal female Flu Vaccine  Declined  Genetic Screen   NIPS: low risk  TDaP vaccine   08/27/18 Hgb A1C or  GTT Early  Third trimester: GDM  Rhogam  n/a   LAB RESULTS  Feeding Plan Breast Blood Type O/Positive/-- (05/31 1059) O pos Contraception nexplanon Antibody Negative (05/31 1059)neg Circumcision Female  Rub    Past Surgical History: Past Surgical History:  Procedure Laterality Date   CESAREAN SECTION     DILATION AND EVACUATION Bilateral 07/14/2015   Procedure: DILATATION AND EVACUATION;  Surgeon: BFrederico Hamman MD;  [facility];  Service: Gynecology;  Laterality: Bilateral;   FOREARM FRACTURE SURGERY Right 2008   WISDOM TOOTH EXTRACTION      Obstetrical History: OB History     Gravida  8   Para  7   Term  6   Preterm  1   AB  0   Living  7      SAB  0   IAB  0   Ectopic  0   Multiple  0   Live Births  7           Social History Social History   Socioeconomic History   Marital status: Single    Spouse name: Not on file   Number of children: 7   Years of education: Not on file    Highest education level: Not on file  Occupational History   Occupation: [employer]  Tobacco Use   Smoking status: Former    Packs/day: 0.50    Types: Cigarettes    Quit date: 02/10/2021    Years since quitting: 0.7   Smokeless tobacco: Never  Vaping Use   Vaping Use: Never used  Substance and Sexual Activity   Alcohol use: No   Drug use: No   Sexual activity: Yes  Other Topics Concern   Not on file  Social History Narrative   Not on file   Social Determinants of Health   Financial Resource Strain: Not on file  Food Insecurity: Not on file  Transportation Needs: Not on file  Physical Activity: Not on file  Stress: Not on file  Social Connections: Not on file    Family History: Family History  Problem Relation Age of Onset   Hypertension Mother    Hypertension Maternal Grandmother    Hypertension Paternal Grandmother    Hypertension Father    Colon cancer Neg Hx    Esophageal cancer Neg Hx    Rectal cancer Neg Hx     Allergies: No Known Allergies  Medications Prior to  Admission  Medication Sig Dispense Refill Last Dose   ACCU-CHEK GUIDE test strip TEST UP TO 4 TIMES DAILY 100 strip 4    aspirin EC 81 MG tablet Take 1 tablet (81 mg total) by mouth daily. Swallow whole. 30 tablet 8    blood glucose meter kit and supplies KIT Dispense based on patient and insurance preference. Use up to four times daily as directed. 1 each 0    blood glucose meter kit and supplies KIT Dispense based on patient and insurance preference. Use up to four times daily as directed. 1 each 0    omeprazole (PRILOSEC) 40 MG capsule TAKE 1 CAPSULE BY MOUTH EVERY DAY 90 capsule 1    Prenat-FeAsp-Meth-FA-DHA w/o A (PRENATE PIXIE) 10-0.6-0.4-200 MG CAPS Take 1 tablet by mouth daily. 30 capsule 8    valACYclovir (VALTREX) 500 MG tablet Take 1 tablet (500 mg total) by mouth 2 (two) times daily. 30 tablet 1      Review of Systems   All systems reviewed and negative except as stated in  HPI  Blood pressure 118/75, pulse 82, temperature (!) 96.8 F (36 C), temperature source Tympanic, resp. rate 16, last menstrual period 01/21/2021, SpO2 99 %. General appearance: alert, cooperative, and no distress Lungs: clear to auscultation bilaterally Heart: regular rate and rhythm Abdomen: soft, non-tender; bowel sounds normal Pelvic: n/a Extremities: Homans sign is negative, no sign of DVT DTR's +2 Presentation: cephalic Fetal monitoring: patient delivered without monitoring   Prenatal labs: ABO, Rh: --/--/PENDING (11/25 1805) Antibody: PENDING (11/25 1805) Rubella: 1.62 (05/17 1548) RPR: Non Reactive (08/25 1037)  HBsAg: Negative (05/17 1548)  HIV: Non Reactive (08/25 1037)  GBS: Negative/-- (11/08 1042)   Prenatal Transfer Tool  Maternal Diabetes: Yes:  Diabetes Type:  Diet controlled Genetic Screening: Normal Maternal Ultrasounds/Referrals: Normal Fetal Ultrasounds or other Referrals:  Referred to Materal Fetal Medicine  Maternal Substance Abuse:  No Significant Maternal Medications:  None Significant Maternal Lab Results: Group B Strep negative  Results for orders placed or performed during the hospital encounter of 11/11/21 (from the past 24 hour(s))  Type and screen [place]   Collection Time: 11/11/21  6:05 PM  Result Value Ref Range   ABO/RH(D) PENDING    Antibody Screen PENDING    Sample Expiration      11/14/2021,2359 Performed at [facility], [place] [address], [place], [place] [address]   CBC   Collection Time: 11/11/21  6:17 PM  Result Value Ref Range   WBC 10.4 4.0 - 10.5 K/uL   RBC 3.83 (L) 3.87 - 5.11 MIL/uL   Hemoglobin 11.4 (L) 12.0 - 15.0 g/dL   HCT 35.0 (L) 36.0 - 46.0 %   MCV 91.4 80.0 - 100.0 fL   MCH 29.8 26.0 - 34.0 pg   MCHC 32.6 30.0 - 36.0 g/dL   RDW 14.1 11.5 - 15.5 %   Platelets 223 150 - 400 K/uL   nRBC 0.0 0.0 - 0.2 %    Patient Active Problem List   Diagnosis Date Noted   Normal vaginal delivery  11/11/2021   UTI (urinary tract infection) during pregnancy, second trimester 08/28/2021   Polyhydramnios affecting pregnancy 08/11/2021   History of urinary tract infection 08/09/2021   History of herpes genitalis 06/03/2021   Pregnancy headache in second trimester 05/27/2021   Supervision of high risk pregnancy, antepartum 05/03/2021   Tobacco abuse 06/01/2020   Gastroesophageal reflux disease without esophagitis 09/19/2019   GDM, class A1 08/29/2018  Umbilical hernia 67/28/9791   Genital herpes 10/18/2016   Marijuana use 07/30/2016   History of VBAC x6 07/24/2016   Breast lump on right side at 1 o'clock position 07/24/2016    Assessment/Plan:  Leah Walls is a 35 y.o. [id number] at 50w5dhere for VBAC in [facility]  #Labor: delivered. Will transfer to labor and delivery for recovery #Pain: N/a #ID:  GBS neg #MOF: Breast #MOC: POP #Circ:  yes  CWende Mott CNM  11/11/2021, 6:56 PM

## 2021-11-11 NOTE — ED Notes (Signed)
Patient transported to women's by rapid OB team.

## 2021-11-11 NOTE — ED Provider Notes (Signed)
Emergency Medicine Provider Triage Evaluation Note  Leah Walls , a 35 y.o. female  was evaluated in triage.  Pt presents in triage concerned for active labor, states that her baby is "coming out".  Patient is holding her self up off of the wheelchair.  This is delivery #8 for this mother, she believes she is [redacted] weeks pregnant with plan to induce 3 days from now. Review of Systems  Positive: Active labor Negative:  ROS not completed due to acuity of situation  Physical Exam  BP 123/74   Pulse 95   Temp (!) 96.8 F (36 C) (Tympanic)   Resp (!) 22   LMP 01/21/2021   SpO2 99%  Gen:   Awake, no distress   Resp:  Normal effort  MSK:   Moves extremities without difficulty  Other:  Brief GU exam performed with fetus crowning, amniotic sac ruptured in triage  Medical Decision Making  Medically screening exam initiated at 6:13 PM.  Appropriate orders placed.  Leah Walls was informed that the remainder of the evaluation will be completed by another provider, this initial triage assessment does not replace that evaluation, and the importance of remaining in the ED until their evaluation is complete.  Patient emergently transported to trauma B, attending physician at the bedside, healthy baby boy born at 44 PM.  Please see attending physician note for further insight.  This chart was dictated using voice recognition software, Dragon. Despite the best efforts of this provider to proofread and correct errors, errors may still occur which can change documentation meaning.    Sherrilee Gilles 11/11/21 1816    Cheryll Cockayne, MD 11/11/21 Rickey Primus

## 2021-11-11 NOTE — ED Notes (Signed)
[facility]apid OB at bedside 

## 2021-11-11 NOTE — Progress Notes (Signed)
RROB called at approximately 1805 to the Ed where pt was reportedly crowning. Faculty CNM called while in route. RROB and additional RN arrived at 1810 and infant was already delivered.  Infant appears to be term and vigorous.  Pitocin started and placenta was delivered once cnm arrived.  Pt stable so transported to L&D room 217 to assess for repair and recovery.

## 2021-11-11 NOTE — Discharge Summary (Addendum)
Postpartum Discharge Summary     Patient Name: Leah Walls DOB: Jan 26, 1986 MRN: [id number]  Date of admission: 11/11/2021 Delivery date:11/11/2021  Delivering provider: Wende Mott  Date of discharge: 11/12/2021  Admitting diagnosis: Vaginal delivery [O80] Gestational diabetes mellitus (GDM) affecting ninth pregnancy [O24.419, O09.40] Normal spontaneous vaginal delivery [O80] Intrauterine pregnancy: [redacted]w[redacted]d    Secondary diagnosis:  Principal Problem:   Gestational diabetes mellitus (GDM) affecting ninth pregnancy Active Problems:   Normal vaginal delivery   Normal spontaneous vaginal delivery  Additional problems: HSV, A1GDM, hx of VBAC x6,     Discharge diagnosis: Term Pregnancy Delivered, VBAC, and GDM A1                                              Post partum procedures: none Augmentation: N/A Complications: None  Hospital course: Onset of Labor With Vaginal Delivery      35y.o. yo [id number] 342w5das admitted in Active Labor on 11/11/2021. Patient had an uncomplicated labor course as follows: [facility] Membrane Rupture Time/Date: 6:08 PM ,11/11/2021   Delivery Method:VBAC, Spontaneous  Episiotomy: None  Lacerations:  None  Patient had an uncomplicated postpartum course.  She is ambulating, tolerating a regular diet, passing flatus, and urinating well. Patient is discharged home in stable condition on 11/12/21.  Newborn Data: Birth date:11/11/2021  Birth time:6:08 PM  Gender:Female  Living status:Living  Apgars:8 ,9  Weight:3050 g   Magnesium Sulfate received: No BMZ received: No Rhophylac:N/A MMR:N/A T-DaP: declined Flu: N/A Transfusion:No  Physical exam  Vitals:   11/12/21 0132 11/12/21 0523 11/12/21 1000 11/12/21 1710  BP: 96/67 (!) 103/54 105/73 93/62  Pulse: 67 64 73 80  Resp: 18 16 16 17   Temp: 98.3 F (36.8 C) 98.2 F (36.8 C) 98.2 F (36.8 C)   TempSrc: Oral Oral Oral Oral  SpO2:  100% 100% 100% 100%   General: alert, cooperative, and no distress Lochia: appropriate Uterine Fundus: firm DVT Evaluation: No significant calf/ankle edema.  Labs: Lab Results  Component Value Date   WBC 13.4 (H) 11/12/2021   HGB 9.4 (L) 11/12/2021   HCT 29.3 (L) 11/12/2021   MCV 89.6 11/12/2021   PLT 218 11/12/2021   CMP Latest Ref Rng & Units 05/27/2021  Glucose 65 - 99 mg/dL 70  BUN 6 - 20 mg/dL 9  Creatinine 0.57 - 1.00 mg/dL 0.68  Sodium 134 - 144 mmol/L 135  Potassium 3.5 - 5.2 mmol/L 3.8  Chloride 96 - 106 mmol/L 99  CO2 20 - 29 mmol/L 21  Calcium 8.7 - 10.2 mg/dL 9.2  Total Protein 6.0 - 8.5 g/dL 6.8  Total Bilirubin 0.0 - 1.2 mg/dL <0.2  Alkaline Phos 44 - 121 IU/L 48  AST 0 - 40 IU/L 10  ALT 0 - 32 IU/L 5   Edinburgh Score: Edinburgh Postnatal Depression Scale Screening Tool 11/12/2021  I have been able to laugh and see the funny side of things. 0  I have looked forward with enjoyment to things. 0  I have blamed myself unnecessarily when things went wrong. 0  I have been anxious or worried for no good reason. 0  I have felt scared or panicky for no good reason. 0  Things have been getting on top of me. 0  I have been so  unhappy that I have had difficulty sleeping. 0  I have felt sad or miserable. 0  I have been so unhappy that I have been crying. 0  The thought of harming myself has occurred to me. 0  Edinburgh Postnatal Depression Scale Total 0     After visit meds:  Allergies as of 11/12/2021   No Known Allergies      Medication List     STOP taking these medications    Accu-Chek Guide test strip Generic drug: glucose blood   aspirin EC 81 MG tablet   blood glucose meter kit and supplies Kit       TAKE these medications    acetaminophen 500 MG tablet Commonly known as: TYLENOL Take 2 tablets (1,000 mg total) by mouth every 8 (eight) hours as needed (for pain).   ibuprofen 600 MG tablet Commonly known as: ADVIL Take 1 tablet (600  mg total) by mouth every 6 (six) hours as needed.   omeprazole 40 MG capsule Commonly known as: PRILOSEC TAKE 1 CAPSULE BY MOUTH EVERY DAY   Prenate Pixie 10-0.6-0.4-200 MG Caps Take 1 tablet by mouth daily.   valACYclovir 500 MG tablet Commonly known as: Valtrex Take 1 tablet (500 mg total) by mouth 2 (two) times daily.         Discharge home in stable condition Infant Feeding: Breast Infant Disposition:home with mother Discharge instruction: per After Visit Summary and Postpartum booklet. Activity: Advance as tolerated. Pelvic rest for 6 weeks.  Diet: carb modified diet   Follow up Visit: Please schedule this patient for a In person postpartum visit in 4 weeks with the following provider: Any provider. Additional Postpartum F/U:2 hour GTT  High risk pregnancy complicated by: GDM and TOLAC Delivery mode:  VBAC, Spontaneous  Anticipated Birth Control:  POPs   11/12/2021 Kristeen Miss, MD   GME ATTESTATION:  I saw and evaluated the patient. I agree with the findings and the plan of care as documented in the resident's note. I have made changes to documentation as necessary.  Vilma Meckel, MD OB Fellow, [place] [facility] 11/13/2021 3:34 AM

## 2021-11-12 LAB — CBC
HCT: 29.3 % — ABNORMAL LOW (ref 36.0–46.0)
Hemoglobin: 9.4 g/dL — ABNORMAL LOW (ref 12.0–15.0)
MCH: 28.7 pg (ref 26.0–34.0)
MCHC: 32.1 g/dL (ref 30.0–36.0)
MCV: 89.6 fL (ref 80.0–100.0)
Platelets: 218 10*3/uL (ref 150–400)
RBC: 3.27 MIL/uL — ABNORMAL LOW (ref 3.87–5.11)
RDW: 14 % (ref 11.5–15.5)
WBC: 13.4 10*3/uL — ABNORMAL HIGH (ref 4.0–10.5)
nRBC: 0 % (ref 0.0–0.2)

## 2021-11-12 LAB — RPR: RPR Ser Ql: NONREACTIVE

## 2021-11-12 MED ORDER — IBUPROFEN 600 MG PO TABS: 600.0000 mg | ORAL_TABLET | Freq: Four times a day (QID) | ORAL | 0 refills | Status: DC | PRN

## 2021-11-12 MED ORDER — ACETAMINOPHEN 500 MG PO TABS: 1000.0000 mg | ORAL_TABLET | Freq: Three times a day (TID) | ORAL | 0 refills | Status: DC | PRN

## 2021-11-12 MED ORDER — FERROUS SULFATE 325 (65 FE) MG PO TABS
325.0000 mg | ORAL_TABLET | ORAL | Status: DC
  Administered 2021-11-12: 325 mg via ORAL
  Filled 2021-11-12: qty 1

## 2021-11-12 NOTE — Lactation Note (Addendum)
This note was copied from a baby's chart. Lactation Consultation Note  Patient Name: Leah Walls [id number] Date: 11/12/2021 Reason for consult: Initial assessment;Mother's request;Difficult latch;Term;Maternal endocrine disorder;Hyperbilirubinemia;Breastfeeding assistance Age:35 years  Mom shallow latches at time of visit, her nipples sore no abrasions noted. Infant not able to open wide, tried to get more depth in prone position. Mom nipples too sore to continue.   Mom take breast rest using EBM and coconut oil for nipple care.  Infant bilirubin rising. Mom to work on increasing volume as she pumps and bottle feeds.   Mom denied any pain with current flange size. Mom aware to adjust accordingly as needed.  Plan 1. To feed based on cues 8-12x 24hr period. Mom to offer EBM first followed by formula volume 15 ml or more if not going to breast.  2. Mom to pump with DEBP q 3hrs for 15 min. Mom variations amount getting from pumping last session 11 ml.  3. Mom to get on pumping regimen DEBP q 3hrs for   All questions answered at the end of the visit.   Maternal Data Has patient been taught Hand Expression?: Yes Does the patient have breastfeeding experience prior to this delivery?: Yes How long did the patient breastfeed?: 7 months  Feeding Mother's Current Feeding Choice: Breast Milk and Formula Nipple Type: Regular  LATCH Score                    Lactation Tools Discussed/Used Tools: Flanges;Pump Flange Size: 24 Breast pump type: Double-Electric Breast Pump (Mom stated 24 flanges comfortable fit. Mom aware flanges sizes can change to adjust accordingly.) Pump Education: Setup, frequency, and cleaning;Milk Storage Reason for Pumping: increase stimulation Pumping frequency: every 3 hrs for  Interventions Interventions: Breast feeding basics reviewed;Adjust position;DEBP;Assisted with latch;Support pillows;Education;Position options;Skin to skin;Breast  massage;Expressed milk;LC Services brochure;Hand express;Coconut oil;Infant Driven Feeding Algorithm education;Breast compression  Discharge WIC Program: No  Consult Status Consult Status: Follow-up Date: 11/13/21 Follow-up type: In-patient    Shellie Rogoff  [place] 11/12/2021, 6:01 PM

## 2021-11-12 NOTE — Clinical Social Work Maternal (Addendum)
CLINICAL SOCIAL WORK MATERNAL/CHILD NOTE  Patient Details  Name: Leah Walls MRN: [id number] Date of Birth: 10/05/1986  Date:  11/12/2021  Clinical Social Worker Initiating Note:  Addaleigh Nicholls, MSW, LCSWA Date/Time: Initiated:  11/12/21/1103     Child's Name:  Mark Lowe   Biological Parents:  Mother, Father (Charles Lowe. Per chart "James Lowe".)   Need for Interpreter:  None   Reason for Referral:  Behavioral Health Concerns, Current Substance Use/Substance Use During Pregnancy     Mailing Address:  [address] [place] [address] (MOB's father)  Physical address: [address] [place] [address]   Phone number:  [phone] (home) [phone] (work)    Additional phone number: [phone] FOB- Charles "James" Lowe  Household Members/Support Persons (HM/SP):   Household Member/Support Person 1, Household Member/Support Person 2, Household Member/Support Person 3, Household Member/Support Person 4, Household Member/Support Person 5, Household Member/Support Person 6, Household Member/Support Person 7, Household Member/Support Person 8   HM/SP Name Relationship DOB or Age  HM/SP -1 Charles Lowe. Per chart, "James Lowe". FOB 06/12/80  HM/SP -2 Summer Lowe Daughter 10/29/18  HM/SP -3 Clark Lowe Son 02/06/17  HM/SP -4 Sanaa Lowe Daughter 07/07/15  HM/SP -5 Isabella Bartolucci Daughter 03/11/09  HM/SP -6 Ishmael Caso Son 02/01/03  HM/SP -7 Israel Little- [employer] Son 04/08/05  HM/SP -8 Howard Little Jr Son 12/01/06    Natural Supports (not living in the home):  Parent (MOB's father.)   Professional Supports: None   Employment: Self-employed   Type of Work:     Education:  High school graduate   Homebound arranged:    Financial Resources:      Other Resources:  Food Stamps     Cultural/Religious Considerations Which May Impact Care:    Strengths:  Pediatrician chosen   Psychotropic Medications:         Pediatrician:    [place]  area  Pediatrician List:   [facility]  [place]    [place]    [place]    [place]    [place]      Pediatrician Fax Number:    Risk Factors/Current Problems:  Substance Use  , Mental Health Concerns     Cognitive State:  Able to Concentrate  , Alert  , Insightful  , Linear Thinking     Mood/Affect:  Happy  , Calm  , Bright  , Relaxed  , Interested  , Comfortable     CSW Assessment: CSW met with MOB to complete consult for mental health, and substance use during pregnancy. CSW observed MOB resting in bed, infant in bassinet, and FOB exiting the room. CSW explained role, and reason for consult. MOB was pleasant, and polite during engagement with CSW. MOB reported, history of anxiety, and depression four years ago. MOB reported, she believes her symptoms were due to postpartum after one of her previous pregnancies. MOB denied any psychotropic medication, and therapy involvement. MOB reported, she has been able to manage symptoms without medication needed. CSW encourage MOB to implement healthy coping skills when symptoms arises.   MOB reported, history of THC, and her last use was two weeks ago. MOB reported, the duration of her use has been six months. MOB reported, history of migraines is the reason for her THC use. MOB denied any history of additional illicit substances. MOB reported history of CPS, due to substance use during previous pregnancies. CSW inform MOB of drug screen policy, and MOB was   understanding of protocol. CSW made CPS report to [place] with Charles Keys. CSW will continue to follow the CDS, and will make CPS report if warranted.   CSW provided education regarding the baby blues period vs. perinatal mood disorders, discussed treatment and gave resources for mental health follow up if concerns arise. CSW recommends self- evaluation during the postpartum time period using the New Mom Checklist from Postpartum Progress and  encouraged MOB to contact a medical professional if symptoms are noted at any time.   MOB reported, since delivery she feels, "fine". MOB reported, her father is very supportive. MOB denied SI, HI, and DV when CSW assessed for safety.   MOB reported, she receives food stamps, but does not receives WIC. CSW encourage MOB to apply for WIC for additional support. MOB reported, infant's [facility] will be at [facility], and there are no transportation barriers to follow up infant's care. MOB reported, she does not have all essentials needed to care for infant. MOB reported, infant's car seat has not arrive, nor does infant has a safe sleeping option. CSW encourage MOB to reach back out if infant car seat does not arrive prior to discharge. MOB denied any additional barriers.     CSW provided education on sudden infant death syndrome (SIDS).  CSW will continue to follow the CDS, and will make CPS aware of any additional information.   CSW Plan/Description:  Sudden Infant Death Syndrome (SIDS) Education, Perinatal Mood and Anxiety Disorder (PMADs) Education, No Further Intervention Required/No Barriers to Discharge, CSW Will Continue to Monitor Umbilical Cord Tissue Drug Screen Results and Make Report if Warranted, Hospital Drug Screen Policy Information, Other Information/Referral to Community Resources, Child Protective Service Report      Lilyahna Sirmon, LCSWA 11/12/2021, 11:14 AM  Hallis Meditz, MSW, LCSW-A Clinical Social Worker- Weekends [phone]  

## 2021-11-12 NOTE — Progress Notes (Signed)
Post Partum Day 1 Subjective: Doing well. No acute events overnight. Pain is controlled and bleeding is appropriate. She is eating, drinking, voiding, and ambulating without issue. She is breast feeding which is going well. She has no other concerns at this time.  Objective: Blood pressure (!) 103/54, pulse 64, temperature 98.2 F (36.8 C), temperature source Oral, resp. rate 16, last menstrual period 01/21/2021, SpO2 100 %.  Physical Exam:  General: alert, cooperative, and no distress Lochia: appropriate Uterine Fundus: firm and below umbilicus  DVT Evaluation: no LE edema or calf tenderness to palpation   Recent Labs    11/11/21 1817 11/12/21 0225  HGB 11.4* 9.4*  HCT 35.0* 29.3*    Assessment/Plan: Leah Walls is a 35 y.o. [id number] on PPD# 1 s/p SVD.  Progressing well. Meeting postpartum milestones. VSS. Continue routine postpartum care.  #A1GDM: -Plan for fasting CBG tomorrow AM  #Acute blood loss anemia: -Asymptomatic  -Will start PO iron   Feeding: Breast Contraception: Undecided  Circumcision: Desires - consented at bedside   Dispo: Patient thinks she would like to stay today. Plan for discharge PPD#2.    LOS: 1 day   Worthy Rancher, MD  11/12/2021, 9:07 AM

## 2021-11-13 ENCOUNTER — Inpatient Hospital Stay (HOSPITAL_COMMUNITY): Payer: Medicaid Other

## 2021-11-13 ENCOUNTER — Inpatient Hospital Stay (HOSPITAL_COMMUNITY): Admission: AD | Admit: 2021-11-13 | Payer: Medicaid Other | Source: Home / Self Care | Admitting: Family Medicine

## 2021-11-14 ENCOUNTER — Telehealth: Payer: Self-pay

## 2021-11-14 NOTE — Telephone Encounter (Signed)
Transition Care Management Unsuccessful Follow-up Telephone Call  Date of discharge and from where:  11/12/2021 from [facility]  Attempts:  1st Attempt  Reason for unsuccessful TCM follow-up call:  Left voice message

## 2021-11-15 NOTE — Telephone Encounter (Signed)
Transition Care Management Follow-up Telephone Call Date of discharge and from where: 11/12/2021 from [facility] How have you been since you were released from the hospital? Pt stated that she doing okay. Pt stated that she has been experiencing headaches since giving birth. Pt denies any chest pains, dizziness, sob or doe. Pt agreed to contact OBGYN if symptoms persisted.  Any questions or concerns? No  Items Reviewed: Did the pt receive and understand the discharge instructions provided? Yes  Medications obtained and verified? Yes  Other? No  Any new allergies since your discharge? No  Dietary orders reviewed? No Do you have support at home? Yes   Functional Questionnaire: (I = Independent and D = Dependent) ADLs: I  Bathing/Dressing- I  Meal Prep- I  Eating- I  Maintaining continence- I  Transferring/Ambulation- I  Managing Meds- I   Follow up appointments reviewed:  PCP Hospital f/u appt confirmed? No   Specialist Hospital f/u appt confirmed? Yes  Scheduled to see Mariel Aloe, MD on 12/08/2021 @ 10:35am. Are transportation arrangements needed? No  If their condition worsens, is the pt aware to call PCP or go to the Emergency Dept.? Yes Was the patient provided with contact information for the PCP's office or ED? Yes Was to pt encouraged to call back with questions or concerns? Yes

## 2021-11-24 ENCOUNTER — Telehealth (HOSPITAL_COMMUNITY): Payer: Self-pay | Admitting: *Deleted

## 2021-11-24 NOTE — Telephone Encounter (Signed)
Attempted Hospital Discharge Follow-Up Call.  No answer.  Unable to leave a message. 

## 2021-12-08 ENCOUNTER — Ambulatory Visit: Payer: Medicaid Other | Admitting: Obstetrics and Gynecology

## 2021-12-08 ENCOUNTER — Other Ambulatory Visit: Payer: Medicaid Other

## 2022-01-04 ENCOUNTER — Other Ambulatory Visit: Payer: Self-pay

## 2022-01-04 DIAGNOSIS — O24410 Gestational diabetes mellitus in pregnancy, diet controlled: Secondary | ICD-10-CM

## 2022-01-06 ENCOUNTER — Encounter: Payer: Self-pay | Admitting: Obstetrics & Gynecology

## 2022-01-06 ENCOUNTER — Other Ambulatory Visit: Payer: Self-pay

## 2022-01-06 ENCOUNTER — Ambulatory Visit (INDEPENDENT_AMBULATORY_CARE_PROVIDER_SITE_OTHER): Payer: Medicaid Other | Admitting: Obstetrics & Gynecology

## 2022-01-06 ENCOUNTER — Other Ambulatory Visit: Payer: Medicaid Other

## 2022-01-06 DIAGNOSIS — Z8632 Personal history of gestational diabetes: Secondary | ICD-10-CM | POA: Diagnosis not present

## 2022-01-06 DIAGNOSIS — Z30011 Encounter for initial prescription of contraceptive pills: Secondary | ICD-10-CM

## 2022-01-06 DIAGNOSIS — I10 Essential (primary) hypertension: Secondary | ICD-10-CM

## 2022-01-06 LAB — POCT PREGNANCY, URINE: Preg Test, Ur: NEGATIVE

## 2022-01-06 MED ORDER — SLYND 4 MG PO TABS: 1.0000 | ORAL_TABLET | Freq: Every day | ORAL | 11 refills | Status: DC

## 2022-01-06 MED ORDER — ENALAPRIL MALEATE 5 MG PO TABS: 5.0000 mg | ORAL_TABLET | Freq: Every day | ORAL | 2 refills | Status: DC

## 2022-01-06 NOTE — Progress Notes (Signed)
Post Partum Visit Note  Leah Walls is a 36 y.o. [id number] female who presents for a postpartum visit. She is 8 weeks postpartum following a normal spontaneous vaginal delivery.  I have fully reviewed the prenatal and intrapartum course. The delivery was at [redacted]w[redacted]d gestational weeks.  Anesthesia: none. Postpartum course has been well per patient. Baby is doing well per patient. Baby is feeding by breast. Bleeding no bleeding. Bowel function is normal. Bladder function is normal. Patient is sexually active. Contraception method is none. Postpartum depression screening: negative.  The pregnancy intention screening data noted above was reviewed. Potential methods of contraception were discussed. The patient elected to proceed with progestin only pills.   Edinburgh Postnatal Depression Scale - 01/06/22 0838       Edinburgh Postnatal Depression Scale:  In the Past 7 Days   I have been able to laugh and see the funny side of things. 0    I have looked forward with enjoyment to things. 0    I have blamed myself unnecessarily when things went wrong. 0    I have been anxious or worried for no good reason. 0    I have felt scared or panicky for no good reason. 0    Things have been getting on top of me. 0    I have been so unhappy that I have had difficulty sleeping. 0    I have felt sad or miserable. 0    I have been so unhappy that I have been crying. 0    The thought of harming myself has occurred to me. 0    Edinburgh Postnatal Depression Scale Total 0             Health Maintenance Due  Topic Date Due   COVID-19 Vaccine (1) Never done   URINE MICROALBUMIN  Never done    The following portions of the patient's history were reviewed and updated as appropriate: allergies, current medications, past family history, past medical history, past social history, past surgical history, and problem list.  Review of Systems Pertinent items noted in HPI and remainder of comprehensive ROS  otherwise negative.  Objective:  BP (!) 138/106    Pulse 96    Wt 132 lb 8 oz (60.1 kg)    LMP 01/21/2021    Breastfeeding Yes    BMI 26.76 kg/m    General:  alert and no distress   Breasts:  normal  Lungs: clear to auscultation bilaterally  Heart:  regular rate and rhythm  Abdomen: soft, non-tender; bowel sounds normal; no masses,  no organomegaly   GU exam:  not indicated       Assessment:   1. Postpartum care following vaginal delivery 2. H/O gestational diabetes mellitus, not currently pregnant  3. Hypertension, unspecified type - enalapril (VASOTEC) 5 MG tablet; Take 1 tablet (5 mg total) by mouth daily.  Dispense: 30 tablet; Refill: 2 4. Encounter for initial prescription of contraceptive pills - Drospirenone (SLYND) 4 MG TABS; Take 1 tablet by mouth daily.  Dispense: 28 tablet; Refill: 11    Plan:   Essential components of care per ACOG recommendations:  1.  Mood and well being: Patient with negative depression screening today. Reviewed local resources for support.  - Patient tobacco use? Yes. Patient desires to quit? Yes.Discussed reduction and cessation  - hx of drug use? No.    2. Infant care and feeding:  -Patient currently breastmilk feeding? Yes. Reviewed importance of draining breast regularly  to support lactation.  -Social determinants of health (SDOH) reviewed in EPIC. No concerns  3. Sexuality, contraception and birth spacing - Patient does not want a pregnancy in the next year.  Desired family size is 8 children.  - Reviewed forms of contraception in tiered fashion. Patient desired oral progesterone-only contraceptive today.  Slynd prescribed - Discussed birth spacing of 18 months  4. Sleep and fatigue -Encouraged family/partner/community support of 4 hrs of uninterrupted sleep to help with mood and fatigue  5. Physical Recovery  - Discussed patients delivery and complications. She describes her labor as good. - Patient had a Vaginal, no problems at  delivery. Patient had no laceration. Perineal healing reviewed. Patient expressed understanding - Patient has urinary incontinence? No. - Patient is safe to resume physical and sexual activity  6.  Health Maintenance - HM due items addressed Yes - Last pap smear  Diagnosis  Date Value Ref Range Status  06/01/2020   Final   - Negative for Intraepithelial Lesions or Malignancy (NILM)  06/01/2020   Final   - Benign reactive/reparative changes including parakeratosis   Pap smear not done at today's visit.  -Breast Cancer screening indicated? No.   7. HTN - Enalapril 5 mg po qd prescribed. Recommended to check BP next week, let us know - Will follow up with PCP  8. H/o GDM - Declined 2 hr GTT and not fasting today, will need HgA1C to be checked by her PCP.  Jaynie Collins, MD [facility] [employer]

## 2022-01-11 ENCOUNTER — Other Ambulatory Visit: Payer: Medicaid Other

## 2022-01-26 ENCOUNTER — Ambulatory Visit (INDEPENDENT_AMBULATORY_CARE_PROVIDER_SITE_OTHER): Payer: Medicaid Other | Admitting: Family Medicine

## 2022-01-26 ENCOUNTER — Telehealth: Payer: Self-pay

## 2022-01-26 ENCOUNTER — Other Ambulatory Visit: Payer: Self-pay

## 2022-01-26 VITALS — BP 134/96 | HR 77 | Wt 134.4 lb

## 2022-01-26 DIAGNOSIS — O165 Unspecified maternal hypertension, complicating the puerperium: Secondary | ICD-10-CM

## 2022-01-26 NOTE — Progress Notes (Signed)
° ° °  SUBJECTIVE:   CHIEF COMPLAINT / HPI:   Elevated blood pressure reading at home BP readings of 160/115.  Accompanied with several days of headache, blurry vision, right eye pain, nausea, vomiting, right neck stiffness.  Seen for postpartum visit 2-3 weeks ago.  History of high blood pressure in prior pregnancy that required medication.  Most recent pregnancy did not have high blood pressure until postpartum.  Patient states she is 2 months postpartum.  Patient states she is taking blood pressure medication and will sometimes double up when she has a bad headache.  PERTINENT  PMH / PSH: As above.   OBJECTIVE:   BP (!) 134/96    Pulse 77    Wt 134 lb 6.4 oz (61 kg)    LMP 01/18/2022 (Approximate)    SpO2 100%    BMI 27.15 kg/m   Physical Exam Vitals reviewed.  Constitutional:      General: She is not in acute distress.    Appearance: She is ill-appearing. She is not toxic-appearing.  Eyes:     Extraocular Movements: Extraocular movements intact.     Pupils: Pupils are equal, round, and reactive to light.  Cardiovascular:     Rate and Rhythm: Normal rate and regular rhythm.     Heart sounds: Normal heart sounds.  Pulmonary:     Effort: Pulmonary effort is normal.     Breath sounds: Normal breath sounds.  Neurological:     General: No focal deficit present.     Mental Status: She is alert and oriented to person, place, and time.     Cranial Nerves: Cranial nerves 2-12 are intact. No cranial nerve deficit, dysarthria or facial asymmetry.     Sensory: Sensation is intact. No sensory deficit.     Motor: Motor function is intact. No weakness, abnormal muscle tone or seizure activity.     Gait: Gait normal.   ASSESSMENT/PLAN:   Postpartum hypertension 36 year old [id number] most recently giving birth 11-11-2021 and 39 weeks and 5 days.  BP elevated in office today.  Reviewed phone note earlier this morning patient reported home BP readings of 155/107 and 169/115.  Has associated  headaches, nausea, vomiting, vision changes.  Reviewed postpartum visit from 12/2021.  BP at that time 138/106 started on enalapril 5 mg daily.  Neuro exam is without obvious deficits but symptoms are concerning and warrants further investigation. Patient is technically 11 weeks postpartum so less likely postpartum preeclampsia and more so complications of uncontrolled hypertension.  Discussed going to maternity assessment unit with the knowledge that she may be asked to go to the adult ED.  She did agree, stating she needed to return home to get her mobile breast pump and that she would have someone drive her.  Lavonda Jumbo, DO [facility]

## 2022-01-26 NOTE — Telephone Encounter (Signed)
Patient calls nurse line regarding elevated BP readings and headaches.   Patient reports most recent BP readings of 155/107 and 169/115. Patient has a history of migraine headaches, however, reports that headaches seem to occur when BP has been elevated. Patient is not having any other symptoms at this time.   Scheduled patient for appointment this AM for further evaluation.   Veronda Prude, RN

## 2022-01-26 NOTE — Patient Instructions (Signed)
They recommended today.  With your symptoms I am concerned for postpartum preeclampsia.  Please seek care immediately in the maternity assessment unit.  Keeping in mind that they may send you to the regular ED.  Dr. Janus Molder

## 2022-01-27 ENCOUNTER — Encounter: Payer: Self-pay | Admitting: Family Medicine

## 2022-01-27 ENCOUNTER — Telehealth: Payer: Self-pay | Admitting: Family Medicine

## 2022-01-27 NOTE — Telephone Encounter (Signed)
I called to check on her. She did not go to the ED yesterday due to the long wait and no one to watch her kids. She denies headaches this morning and no blurry vision. Her BP last checked was 140/92. Her last dose of Elanapril 5 mg was last night.  I advised her to take Magnesium oxide daily for 3-5 days and then prn for headaches.  May increase Enalapril to 10 mg if her BP is consistently higher than 150/90. I advised an ED visit if having N/V, blurry vision, and persistent headache, and she agreed with the plan. She declined an ophthal referral at this time. I will see her in 4 days as scheduled.

## 2022-01-31 ENCOUNTER — Ambulatory Visit: Payer: Medicaid Other | Admitting: Family Medicine

## 2022-02-03 ENCOUNTER — Ambulatory Visit: Payer: Medicaid Other | Admitting: Family Medicine

## 2022-02-03 ENCOUNTER — Encounter: Payer: Self-pay | Admitting: Family Medicine

## 2022-02-03 ENCOUNTER — Telehealth: Payer: Self-pay | Admitting: Family Medicine

## 2022-02-03 NOTE — Telephone Encounter (Signed)
I attempted to reach her twice. She will pick up and cut the line.  She has missed two appointments back to back.  I will send her a certified letter to inform her of our no-show policy.

## 2022-03-06 ENCOUNTER — Other Ambulatory Visit: Payer: Self-pay | Admitting: Family Medicine

## 2022-03-18 ENCOUNTER — Ambulatory Visit (HOSPITAL_COMMUNITY)
Admission: EM | Admit: 2022-03-18 | Discharge: 2022-03-18 | Disposition: A | Discharge status: Home / Self Care | Payer: Medicaid Other | Attending: Emergency Medicine | Admitting: Emergency Medicine

## 2022-03-18 ENCOUNTER — Encounter (HOSPITAL_COMMUNITY): Payer: Self-pay

## 2022-03-18 DIAGNOSIS — R519 Headache, unspecified: Secondary | ICD-10-CM | POA: Diagnosis not present

## 2022-03-18 DIAGNOSIS — I10 Essential (primary) hypertension: Secondary | ICD-10-CM | POA: Diagnosis not present

## 2022-03-18 MED ORDER — AMLODIPINE BESYLATE 5 MG PO TABS
5.0000 mg | ORAL_TABLET | Freq: Every day | ORAL | 0 refills | Status: DC
Start: 2022-03-18 — End: 2022-07-18

## 2022-03-18 MED ORDER — HYDROCHLOROTHIAZIDE 12.5 MG PO TABS: 12.5000 mg | ORAL_TABLET | Freq: Every day | ORAL | 0 refills | Status: DC

## 2022-03-18 MED ORDER — IBUPROFEN 800 MG PO TABS
800.0000 mg | ORAL_TABLET | Freq: Once | ORAL | Status: AC
  Administered 2022-03-18: 800 mg via ORAL

## 2022-03-18 MED ORDER — IBUPROFEN 800 MG PO TABS
ORAL_TABLET | ORAL | Status: AC
  Filled 2022-03-18: qty 1

## 2022-03-18 NOTE — ED Provider Notes (Signed)
?[facility] ? ? ? ?CSN: [id number] ?Arrival date & time: 03/18/22  1734 ?  ? ?HISTORY  ? ?Chief Complaint  ?Patient presents with  ? Hypertension  ? Headache  ? ?HPI ?Leah Walls is a 36 y.o. female. Patient complains of hypertension and intermittent recurrent headaches since she gave birth in November of last year, about 6 months ago.  Patient reports that her headache symptoms are getting worse and that she has noticed her blood pressure is getting worse as well.  Patient called her OB/GYN and was advised to double her enalapril but patient states this did not help at all.  Patient reports significant stress at home, currently raising 12 children, four of which are not hers.  Patient denies chest pain, shortness of breath.  Patient states she has not been sleeping well lately secondary to having an infant that is not sleeping through the night. ? ?The history is provided by the patient.  ?Past Medical History:  ?Diagnosis Date  ? GERD (gastroesophageal reflux disease)   ? Gestational diabetes   ? Headache   ? Hernia, abdominal 2011  ? HSV (herpes simplex virus) infection   ? Supervision of other normal pregnancy, antepartum 05/17/2018  ?  Nursing Staff Provider Office Location  [facility] Dating   Language  English Anatomy US  Normal female Flu Vaccine  Declined  Genetic Screen   NIPS: low risk  TDaP vaccine   08/27/18 Hgb A1C or  GTT Early  Third trimester: GDM  Rhogam  n/a   LAB RESULTS  Feeding Plan Breast Blood Type O/Positive/-- (05/31 1059) O pos Contraception nexplanon Antibody Negative (05/31 1059)neg Circumcision Female  Rub  ? ?Patient Active Problem List  ? Diagnosis Date Noted  ? Normal vaginal delivery 11/11/2021  ? Gestational diabetes mellitus (GDM) affecting ninth pregnancy 11/11/2021  ? UTI (urinary tract infection) during pregnancy, second trimester 08/28/2021  ? Polyhydramnios affecting pregnancy 08/11/2021  ? History of urinary tract infection 08/09/2021  ? History of herpes  genitalis 06/03/2021  ? Pregnancy headache in second trimester 05/27/2021  ? Supervision of high risk pregnancy, antepartum 05/03/2021  ? Tobacco abuse 06/01/2020  ? Gastroesophageal reflux disease without esophagitis 09/19/2019  ? GDM, class A1 08/29/2018  ? Umbilical hernia 11/15/2016  ? Genital herpes 10/18/2016  ? Marijuana use 07/30/2016  ? History of VBAC x6 07/24/2016  ? Breast lump on right side at 1 o'clock position 07/24/2016  ? ?Past Surgical History:  ?Procedure Laterality Date  ? CESAREAN SECTION    ? DILATION AND EVACUATION Bilateral 07/14/2015  ? Procedure: DILATATION AND EVACUATION;  Surgeon: Kathreen CosierBernard A Marshall, MD;  Location: [facility];  Service: Gynecology;  Laterality: Bilateral;  ? FOREARM FRACTURE SURGERY Right 2008  ? WISDOM TOOTH EXTRACTION    ? ?OB History   ? ? Gravida  ?9  ? Para  ?8  ? Term  ?7  ? Preterm  ?1  ? AB  ?0  ? Living  ?8  ?  ? ? SAB  ?0  ? IAB  ?0  ? Ectopic  ?0  ? Multiple  ?0  ? Live Births  ?8  ?   ?  ?  ? ?Home Medications   ? ?Prior to Admission medications   ?Medication Sig Start Date End Date Taking? Authorizing Provider  ?amLODipine (NORVASC) 5 MG tablet Take 1 tablet (5 mg total) by mouth daily. 03/18/22 06/16/22 Yes Theadora RamaMorgan, Mashanda Ishibashi Scales, PA-C  ?hydrochlorothiazide (HYDRODIURIL) 12.5 MG tablet Take 1 tablet (  12.5 mg total) by mouth daily. 03/18/22 06/16/22 Yes Theadora Rama Scales, PA-C  ?acetaminophen (TYLENOL) 500 MG tablet Take 2 tablets (1,000 mg total) by mouth every 8 (eight) hours as needed (for pain). 11/12/21   Angela Cox, MD  ?Drospirenone (SLYND) 4 MG TABS Take 1 tablet by mouth daily. 01/06/22   Anyanwu, Jethro Bastos, MD  ?enalapril (VASOTEC) 5 MG tablet Take 1 tablet (5 mg total) by mouth daily. 01/06/22   Anyanwu, Jethro Bastos, MD  ?ibuprofen (ADVIL) 600 MG tablet Take 1 tablet (600 mg total) by mouth every 6 (six) hours as needed. 11/12/21   Angela Cox, MD  ?omeprazole (PRILOSEC) 40 MG capsule TAKE 1 CAPSULE BY MOUTH EVERY DAY 03/06/22   Doreene Eland, MD  ?Prenat-FeAsp-Meth-FA-DHA w/o A (PRENATE PIXIE) 10-0.6-0.4-200 MG CAPS Take 1 tablet by mouth daily. 04/07/21   Moses Manners, MD  ?promethazine (PHENERGAN) 25 MG tablet Take 1 tablet (25 mg total) by mouth every 6 (six) hours as needed for nausea or vomiting. ?Patient not taking: Reported on 12/05/2018 10/25/18 08/22/19  Tereso Newcomer, MD  ? ? ?Family History ?Family History  ?Problem Relation Age of Onset  ? Hypertension Mother   ? Hypertension Maternal Grandmother   ? Hypertension Paternal Grandmother   ? Hypertension Father   ? Colon cancer Neg Hx   ? Esophageal cancer Neg Hx   ? Rectal cancer Neg Hx   ? ?Social History ?Social History  ? ?Tobacco Use  ? Smoking status: Former  ?  Packs/day: 0.50  ?  Types: Cigarettes  ?  Quit date: 02/10/2021  ?  Years since quitting: 1.1  ? Smokeless tobacco: Never  ?Vaping Use  ? Vaping Use: Never used  ?Substance Use Topics  ? Alcohol use: No  ? Drug use: No  ? ?Allergies   ?Patient has no known allergies. ? ?Review of Systems ?Review of Systems ?Pertinent findings noted in history of present illness.  ? ?Physical Exam ?Triage Vital Signs ?ED Triage Vitals  ?Enc Vitals Group  ?   BP 10/14/21 0827 (!) 147/82  ?   Pulse Rate 10/14/21 0827 72  ?   Resp 10/14/21 0827 18  ?   Temp 10/14/21 0827 98.3 ?F (36.8 ?C)  ?   Temp Source 10/14/21 0827 Oral  ?   SpO2 10/14/21 0827 98 %  ?   Weight --   ?   Height --   ?   Head Circumference --   ?   Peak Flow --   ?   Pain Score 10/14/21 0826 5  ?   Pain Loc --   ?   Pain Edu? --   ?   Excl. in GC? --   ?No data found. ? ?Updated Vital Signs ?BP (!) 159/104 (BP Location: Left Arm)   Pulse 73   Temp 97.9 ?F (36.6 ?C) (Oral)   Resp 18   LMP 02/24/2022   SpO2 98%   Breastfeeding Yes  ? ?Physical Exam ?Vitals and nursing note reviewed.  ?Constitutional:   ?   General: She is not in acute distress. ?   Appearance: Normal appearance. She is not ill-appearing.  ?HENT:  ?   Head: Normocephalic and atraumatic.  ?   Salivary  Glands: Right salivary gland is not diffusely enlarged or tender. Left salivary gland is not diffusely enlarged or tender.  ?   Right Ear: Tympanic membrane, ear canal and external ear normal. No drainage. No middle ear  effusion. There is no impacted cerumen. Tympanic membrane is not erythematous or bulging.  ?   Left Ear: Tympanic membrane, ear canal and external ear normal. No drainage.  No middle ear effusion. There is no impacted cerumen. Tympanic membrane is not erythematous or bulging.  ?   Nose: Nose normal. No nasal deformity, septal deviation, mucosal edema, congestion or rhinorrhea.  ?   Right Turbinates: Not enlarged, swollen or pale.  ?   Left Turbinates: Not enlarged, swollen or pale.  ?   Right Sinus: No maxillary sinus tenderness or frontal sinus tenderness.  ?   Left Sinus: No maxillary sinus tenderness or frontal sinus tenderness.  ?   Mouth/Throat:  ?   Lips: Pink. No lesions.  ?   Mouth: Mucous membranes are moist. No oral lesions.  ?   Pharynx: Oropharynx is clear. Uvula midline. No posterior oropharyngeal erythema or uvula swelling.  ?   Tonsils: No tonsillar exudate. 0 on the right. 0 on the left.  ?Eyes:  ?   General: Lids are normal.     ?   Right eye: No discharge.     ?   Left eye: No discharge.  ?   Extraocular Movements: Extraocular movements intact.  ?   Conjunctiva/sclera: Conjunctivae normal.  ?   Right eye: Right conjunctiva is not injected.  ?   Left eye: Left conjunctiva is not injected.  ?Neck:  ?   Trachea: Trachea and phonation normal.  ?Cardiovascular:  ?   Rate and Rhythm: Normal rate and regular rhythm.  ?   Pulses: Normal pulses.  ?   Heart sounds: Normal heart sounds. No murmur heard. ?  No friction rub. No gallop.  ?Pulmonary:  ?   Effort: Pulmonary effort is normal. No accessory muscle usage, prolonged expiration or respiratory distress.  ?   Breath sounds: Normal breath sounds. No stridor, decreased air movement or transmitted upper airway sounds. No decreased breath  sounds, wheezing, rhonchi or rales.  ?Chest:  ?   Chest wall: No tenderness.  ?Musculoskeletal:     ?   General: Normal range of motion.  ?   Cervical back: Normal range of motion and neck supple. Normal range of motion.

## 2022-03-18 NOTE — ED Triage Notes (Addendum)
Pt c/o headache and HTN since she gave birth (November 2022) but the symptoms are getting worse the past two days. Patient is breast feeding. Pt states she has been taking enalapril (2 tabs).  ?

## 2022-03-18 NOTE — Discharge Instructions (Addendum)
Please stop enalapril at this time.  Clearly, even at a double dose, it is not an effective medication for you. ? ?I recommend that you begin amlodipine and hydrochlorothiazide. ? ?Amlodipine is a calcium channel blocker which lowers your systolic pressure, also known as the top number. ? ?Please take your first dose of amlodipine after you pick up your prescriptions this evening. ? ?Tomorrow morning, please take both hydrochlorothiazide and amlodipine together daily.   ? ?Please follow-up with either your primary care provider, your OB/GYN or return to urgent care if you feel that your blood pressure is not being well controlled. ? ? ? ? ?

## 2022-03-20 ENCOUNTER — Telehealth: Payer: Self-pay

## 2022-03-20 NOTE — Telephone Encounter (Signed)
Patient calls nurse line reporting elevated blood pressures for the last 3 day.  ? ?Patient reports she was seen at [facility] on 4/1 and was given HCTZ and amlodipine. Patient reports her blood pressure has been consisently high all weekend ~150s/100s despite taking her medications. ? ?Patient reports her BP this morning, checked with a wrist cuff, was 144/107. Patient reports double vision, nausea, hot flashes and a "really bad headache." Patient reports taking amlodipine and HCTZ this morning.  ? ?Patient checked BP while on the phone with me and 144/108. ? ?Given patients consistent elevated blood pressures associated with symptoms, patient advised to go to the ED for further evaluation.  ?

## 2022-03-28 NOTE — Telephone Encounter (Signed)
Spoke with patient. Made Urgent Care follow up for 4/18 at 10:50. Aquilla Solian, CMA ? ?

## 2022-04-03 ENCOUNTER — Encounter: Payer: Self-pay | Admitting: Family Medicine

## 2022-04-04 ENCOUNTER — Ambulatory Visit: Payer: Medicaid Other | Admitting: Family Medicine

## 2022-05-02 ENCOUNTER — Ambulatory Visit: Payer: Medicaid Other | Admitting: Family Medicine

## 2022-05-23 ENCOUNTER — Encounter: Payer: Self-pay | Admitting: *Deleted

## 2022-07-17 ENCOUNTER — Other Ambulatory Visit: Payer: Self-pay

## 2022-07-18 ENCOUNTER — Other Ambulatory Visit: Payer: Self-pay | Admitting: Family Medicine

## 2022-07-18 MED ORDER — HYDROCHLOROTHIAZIDE 12.5 MG PO TABS: 12.5000 mg | ORAL_TABLET | Freq: Every day | ORAL | 0 refills | Status: DC

## 2022-07-18 MED ORDER — AMLODIPINE BESYLATE 5 MG PO TABS: 5.0000 mg | ORAL_TABLET | Freq: Every day | ORAL | 0 refills | Status: DC

## 2022-07-20 NOTE — Telephone Encounter (Signed)
Contacted the pt twice, was unable to reach her. Phone rung once and went straight to vm both time . Was unable to leave vm

## 2022-08-12 ENCOUNTER — Other Ambulatory Visit: Payer: Self-pay | Admitting: Family Medicine

## 2022-08-31 ENCOUNTER — Telehealth: Payer: Self-pay | Admitting: Family Medicine

## 2022-09-14 ENCOUNTER — Other Ambulatory Visit: Payer: Self-pay | Admitting: Family Medicine

## 2022-09-14 ENCOUNTER — Encounter: Payer: Self-pay | Admitting: Family Medicine

## 2022-09-14 DIAGNOSIS — I10 Essential (primary) hypertension: Secondary | ICD-10-CM | POA: Insufficient documentation

## 2022-09-14 MED ORDER — OMEPRAZOLE 40 MG PO CPDR: DELAYED_RELEASE_CAPSULE | ORAL | 0 refills | Status: DC

## 2022-09-14 NOTE — Telephone Encounter (Signed)
Patient calls nurse in regards to Omeprazole refill.   Patient advised she needs an apt before this can be filled. Patient advised she has missed several.   Patient requesting blood pressure medication as well.   Patient scheduled for 10/3 with PCP.   Patient requesting a partial refill to get her to apt.

## 2022-09-14 NOTE — Addendum Note (Signed)
Addended by: Dorna Bloom on: 09/14/2022 10:44 AM   Modules accepted: Orders

## 2022-09-14 NOTE — Telephone Encounter (Signed)
Done

## 2022-09-19 ENCOUNTER — Ambulatory Visit (HOSPITAL_BASED_OUTPATIENT_CLINIC_OR_DEPARTMENT_OTHER): Payer: Medicaid Other | Admitting: Family Medicine

## 2022-09-19 ENCOUNTER — Encounter: Payer: Self-pay | Admitting: Family Medicine

## 2022-09-19 VITALS — BP 104/79 | HR 100 | Ht 59.0 in | Wt 123.0 lb

## 2022-09-19 DIAGNOSIS — R7309 Other abnormal glucose: Secondary | ICD-10-CM

## 2022-09-19 DIAGNOSIS — K219 Gastro-esophageal reflux disease without esophagitis: Secondary | ICD-10-CM | POA: Diagnosis not present

## 2022-09-19 DIAGNOSIS — I10 Essential (primary) hypertension: Secondary | ICD-10-CM | POA: Diagnosis not present

## 2022-09-19 DIAGNOSIS — Z8632 Personal history of gestational diabetes: Secondary | ICD-10-CM | POA: Diagnosis not present

## 2022-09-19 DIAGNOSIS — D649 Anemia, unspecified: Secondary | ICD-10-CM | POA: Insufficient documentation

## 2022-09-19 MED ORDER — OMEPRAZOLE 40 MG PO CPDR: DELAYED_RELEASE_CAPSULE | ORAL | 1 refills | Status: DC

## 2022-09-19 MED ORDER — AMLODIPINE BESYLATE 5 MG PO TABS: 5.0000 mg | ORAL_TABLET | Freq: Every day | ORAL | 1 refills | Status: DC

## 2022-09-19 NOTE — Assessment & Plan Note (Signed)
A1C checked. I will call her with her result.

## 2022-09-19 NOTE — Assessment & Plan Note (Signed)
I discussed cutting back on her Omeprazole to 20 mg instead of 40 mg QD. She had not done well on a lower dose in the past and prefers to keep her current dose. I refilled her Omeprazole.

## 2022-09-19 NOTE — Progress Notes (Signed)
    SUBJECTIVE:   CHIEF COMPLAINT / HPI:   HPI:  HTN: She is here for a follow-up. She has been off BP meds for five days. She takes two pills. One is a water pill, and the other was recently started by the ED provider. She could not name any of her medications. No neurologic or cardiopulmonary symptoms. However, she will sometimes have headache with high BP. Since been on water pill, her breast milk production has reduced and she struggles with breastfeeding her 77 month old son who will not drink any other milk. She does not monitor BP closely at home.  GERD: Need med refill.  Anemia: No symptoms. She is here for f/u.  Hx of GDM: Here for f/u  PERTINENT  PMH / PSH: PMHx reviewed  OBJECTIVE:   Vitals:   09/19/22 1134 09/19/22 1146  BP: 107/64 104/79  Pulse: 100   SpO2: 100%   Weight: 123 lb (55.8 kg)   Height: 4\' 11"  (1.499 m)    Physical Exam Vitals and nursing note reviewed.  Cardiovascular:     Rate and Rhythm: Normal rate and regular rhythm.     Heart sounds: Normal heart sounds. No murmur heard. Pulmonary:     Effort: Pulmonary effort is normal. No respiratory distress.     Breath sounds: Normal breath sounds. No stridor.       ASSESSMENT/PLAN:   Essential hypertension It is unclear which medicine she is taking or not. She does not know the names of her meds. She is however, certain she was on water pills (HCTZ) and one other prescribed at the ED (Amlodipine) She was vague about the last dose of her meds.  Based on this, I d/c Enalapril, d/c HCTZ (she is breastfeeding). Refill Norvasc 5 mg QD. Advised home BP monitoring. F/U in 2 weeks for reassessment. Bmet checked.  Gastroesophageal reflux disease without esophagitis I discussed cutting back on her Omeprazole to 20 mg instead of 40 mg QD. She had not done well on a lower dose in the past and prefers to keep her current dose. I refilled her Omeprazole.  History of gestational diabetes A1C  checked. I will call her with her result.  Anemia Anemia panel checked.      Andrena Mews, MD [place]

## 2022-09-19 NOTE — Patient Instructions (Addendum)
  It was nice seeing you today. We discontinue all BP medications except the Amlodipine 5 mg daily. Please, see me back in 2 weeks for BP check.

## 2022-09-19 NOTE — Assessment & Plan Note (Signed)
It is unclear which medicine she is taking or not. She does not know the names of her meds. She is however, certain she was on water pills (HCTZ) and one other prescribed at the ED (Amlodipine) She was vague about the last dose of her meds.  Based on this, I d/c Enalapril, d/c HCTZ (she is breastfeeding). Refill Norvasc 5 mg QD. Advised home BP monitoring. F/U in 2 weeks for reassessment. Bmet checked.

## 2022-09-19 NOTE — Assessment & Plan Note (Signed)
Anemia panel checked.

## 2022-09-20 ENCOUNTER — Telehealth: Payer: Self-pay | Admitting: Family Medicine

## 2022-09-20 LAB — BASIC METABOLIC PANEL
BUN/Creatinine Ratio: 11 (ref 9–23)
BUN: 9 mg/dL (ref 6–20)
CO2: 21 mmol/L (ref 20–29)
Calcium: 9.2 mg/dL (ref 8.7–10.2)
Chloride: 104 mmol/L (ref 96–106)
Creatinine, Ser: 0.8 mg/dL (ref 0.57–1.00)
Glucose: 87 mg/dL (ref 70–99)
Potassium: 3.9 mmol/L (ref 3.5–5.2)
Sodium: 140 mmol/L (ref 134–144)
eGFR: 98 mL/min/{1.73_m2} (ref >59)

## 2022-09-20 LAB — ANEMIA PROFILE B
Basophils Absolute: 0 10*3/uL (ref 0.0–0.2)
Basos: 1 %
EOS (ABSOLUTE): 0.2 10*3/uL (ref 0.0–0.4)
Eos: 4 %
Ferritin: 40 ng/mL (ref 15–150)
Folate: 15.3 ng/mL (ref >3.0)
Hematocrit: 33.7 % — ABNORMAL LOW (ref 34.0–46.6)
Hemoglobin: 11 g/dL — ABNORMAL LOW (ref 11.1–15.9)
Immature Grans (Abs): 0 10*3/uL (ref 0.0–0.1)
Immature Granulocytes: 0 %
Iron Saturation: 10 % — ABNORMAL LOW (ref 15–55)
Iron: 35 ug/dL (ref 27–159)
Lymphocytes Absolute: 2 10*3/uL (ref 0.7–3.1)
Lymphs: 42 %
MCH: 29.3 pg (ref 26.6–33.0)
MCHC: 32.6 g/dL (ref 31.5–35.7)
MCV: 90 fL (ref 79–97)
Monocytes Absolute: 0.3 10*3/uL (ref 0.1–0.9)
Monocytes: 6 %
Neutrophils Absolute: 2.3 10*3/uL (ref 1.4–7.0)
Neutrophils: 47 %
Platelets: 274 10*3/uL (ref 150–450)
RBC: 3.76 x10E6/uL — ABNORMAL LOW (ref 3.77–5.28)
RDW: 12.9 % (ref 11.7–15.4)
Retic Ct Pct: 1.2 % (ref 0.6–2.6)
Total Iron Binding Capacity: 339 ug/dL (ref 250–450)
UIBC: 304 ug/dL (ref 131–425)
Vitamin B-12: 1074 pg/mL (ref 232–1245)
WBC: 4.9 10*3/uL (ref 3.4–10.8)

## 2022-09-20 LAB — HEMOGLOBIN A1C
Est. average glucose Bld gHb Est-mCnc: 120 mg/dL
Hgb A1c MFr Bld: 5.8 % — ABNORMAL HIGH (ref 4.8–5.6)

## 2022-09-20 MED ORDER — FERROUS SULFATE 325 (65 FE) MG PO TABS: 325.0000 mg | ORAL_TABLET | Freq: Every day | ORAL | 99 refills | Status: DC

## 2022-09-20 NOTE — Telephone Encounter (Signed)
Test result discussed. Iron Sat low and Ferritin close to low level. I encouraged her to get back  on her iron supplement. Med escribed. She is preDM. Lifestyle modification discussed. She agreed with the plan.

## 2022-10-12 ENCOUNTER — Other Ambulatory Visit: Payer: Self-pay | Admitting: Family Medicine

## 2022-10-17 ENCOUNTER — Telehealth: Payer: Self-pay

## 2022-10-17 NOTE — Telephone Encounter (Signed)
Attempted to reach patient. No answer. LVM for patient to call the office to make follow up BP appt. Salvatore Marvel, CMA

## 2022-10-17 NOTE — Telephone Encounter (Signed)
-----   Message from Kinnie Feil, MD sent at 10/12/2022  2:18 PM EDT ----- Hello team,  Please, contact patient to help her schedule f/u BP appointment with me soon.  Dr. Johnette Abraham

## 2022-10-26 ENCOUNTER — Other Ambulatory Visit: Payer: Self-pay | Admitting: Family Medicine

## 2023-03-19 DIAGNOSIS — R300 Dysuria: Secondary | ICD-10-CM | POA: Diagnosis not present

## 2023-03-19 DIAGNOSIS — N390 Urinary tract infection, site not specified: Secondary | ICD-10-CM | POA: Diagnosis not present

## 2023-03-19 DIAGNOSIS — N10 Acute tubulo-interstitial nephritis: Secondary | ICD-10-CM | POA: Diagnosis not present

## 2023-03-19 DIAGNOSIS — R809 Proteinuria, unspecified: Secondary | ICD-10-CM | POA: Diagnosis not present

## 2023-03-19 DIAGNOSIS — R81 Glycosuria: Secondary | ICD-10-CM | POA: Diagnosis not present

## 2023-08-24 ENCOUNTER — Encounter: Payer: Self-pay | Admitting: Family Medicine

## 2023-08-29 ENCOUNTER — Telehealth: Payer: Self-pay

## 2023-08-29 NOTE — Telephone Encounter (Signed)
  Medicaid Managed Care   Unsuccessful Outreach Note  08/29/2023 Name: ALMINA DALSING MRN: [id number] DOB: [birth year]  Referred by: Doreene Eland, MD Reason for referral : No chief complaint on file.   An unsuccessful telephone outreach was attempted today. The patient was referred to the case management team for assistance with care management and care coordination.   Follow Up Plan: If patient returns call to provider office, please advise to call [employer]edded Care Management Care Guide Nicholes Rough* at [phone]*  Nicholes Rough, CMA Care Guide [employer]

## 2023-09-06 ENCOUNTER — Encounter: Payer: Self-pay | Admitting: Family Medicine

## 2023-09-07 ENCOUNTER — Ambulatory Visit (INDEPENDENT_AMBULATORY_CARE_PROVIDER_SITE_OTHER): Payer: Medicaid Other | Admitting: Family Medicine

## 2023-09-07 ENCOUNTER — Other Ambulatory Visit (HOSPITAL_COMMUNITY)
Admission: RE | Admit: 2023-09-07 | Discharge: 2023-09-07 | Disposition: A | Discharge status: Home / Self Care | Payer: Medicaid Other | Source: Ambulatory Visit | Attending: Family Medicine | Admitting: Family Medicine

## 2023-09-07 ENCOUNTER — Encounter: Payer: Self-pay | Admitting: Family Medicine

## 2023-09-07 VITALS — BP 101/70 | HR 89 | Ht 59.0 in | Wt 108.5 lb

## 2023-09-07 DIAGNOSIS — D649 Anemia, unspecified: Secondary | ICD-10-CM

## 2023-09-07 DIAGNOSIS — N926 Irregular menstruation, unspecified: Secondary | ICD-10-CM

## 2023-09-07 DIAGNOSIS — Z0000 Encounter for general adult medical examination without abnormal findings: Secondary | ICD-10-CM

## 2023-09-07 DIAGNOSIS — N760 Acute vaginitis: Secondary | ICD-10-CM | POA: Insufficient documentation

## 2023-09-07 DIAGNOSIS — R7303 Prediabetes: Secondary | ICD-10-CM | POA: Diagnosis not present

## 2023-09-07 DIAGNOSIS — I10 Essential (primary) hypertension: Secondary | ICD-10-CM | POA: Diagnosis not present

## 2023-09-07 LAB — POCT GLYCOSYLATED HEMOGLOBIN (HGB A1C): HbA1c POC (<> result, manual entry): 5.5 % (ref 4.0–5.6)

## 2023-09-07 LAB — POCT WET PREP (WET MOUNT)
Clue Cells Wet Prep Whiff POC: POSITIVE
Trichomonas Wet Prep HPF POC: ABSENT

## 2023-09-07 LAB — POCT URINE PREGNANCY: Preg Test, Ur: NEGATIVE

## 2023-09-07 MED ORDER — METRONIDAZOLE 0.75 % VA GEL: 1.0000 | Freq: Every day | VAGINAL | 0 refills | Status: AC

## 2023-09-07 MED ORDER — FLUCONAZOLE 150 MG PO TABS: 150.0000 mg | ORAL_TABLET | Freq: Once | ORAL | 0 refills | Status: AC

## 2023-09-07 NOTE — Progress Notes (Addendum)
SUBJECTIVE:   Chief compliant/HPI: annual examination  Leah Walls is a 37 y.o. who presents today for an annual exam.   Review of systems form notable for , occasional dizziness (denies dizziness now which improves with exercise), vaginal discharge x 1 week. No itching or pain. LMP: Mid August 2024. Irregular. She is sexually active.  Last sexual activity was 1 month ago. Same sexual partner of 20 years or longer, does not use condoms regularly and is not on birth control.   Updated history tabs and problem list Pmhx reviewed.   OBJECTIVE:   BP 101/70   Pulse 89   Ht 4\' 11"  (1.499 m)   Wt 108 lb 8 oz (49.2 kg)   LMP 08/01/2023   SpO2 100%   BMI 21.91 kg/m   Physical Exam Vitals and nursing note reviewed. Exam conducted with a chaperone present Cleatrice Burke).  Constitutional:      Appearance: Normal appearance.  HENT:     Right Ear: Tympanic membrane and ear canal normal.     Left Ear: Tympanic membrane and ear canal normal.     Mouth/Throat:     Mouth: Mucous membranes are moist.     Pharynx: No oropharyngeal exudate or posterior oropharyngeal erythema.  Eyes:     Extraocular Movements: Extraocular movements intact.     Conjunctiva/sclera: Conjunctivae normal.     Pupils: Pupils are equal, round, and reactive to light.  Cardiovascular:     Rate and Rhythm: Normal rate and regular rhythm.     Heart sounds: Normal heart sounds. No murmur heard. Pulmonary:     Effort: Pulmonary effort is normal. No respiratory distress.     Breath sounds: Normal breath sounds. No wheezing.  Abdominal:     General: Abdomen is flat. Bowel sounds are normal. There is no distension.     Palpations: Abdomen is soft. There is no mass.     Tenderness: There is no abdominal tenderness. There is no guarding.  Genitourinary:    Cervix: Discharge present. No cervical motion tenderness.     Uterus: Normal.      Adnexa: Right adnexa normal and left adnexa normal.  Musculoskeletal:      Right lower leg: No edema.     Left lower leg: No edema.  Neurological:     General: No focal deficit present.     Mental Status: She is oriented to person, place, and time.     Cranial Nerves: No cranial nerve deficit.     Sensory: No sensory deficit.     Gait: Gait normal.  Psychiatric:        Mood and Affect: Mood normal.        Behavior: Behavior normal.        Thought Content: Thought content normal.      ASSESSMENT/PLAN:   Essential hypertension Now hypotensive even of meds Asymptomatic - currently Has not eaten or drank water today Repeat BP check improved D/C Amlodipine and monitor BP closely at home BP parameters and goal discussed Bmet checked Adequate hydration encouraged  Anemia May be contributory to intermittent dizziness Anemia panel checked    Annual Examination  See AVS for age appropriate recommendations.   PHQ score 0, reviewed and discussed. Blood pressure reviewed and at goal <120/70.  The patient currently uses nothing for contraception.   Considered the following items based upon USPSTF recommendations: HIV testing: discussed and Declined Hepatitis C:  not indicated Syphilis if at high risk: discussed and declined  GC/CT  ordered - vaginitis Lipid panel (nonfasting or fasting) discussed based upon AHA recommendations and not ordered.  Consider repeat every 4-6 years.  Reviewed risk factors for latent tuberculosis and not indicated  Discussed family history, BRCA testing not indicated. Tool used to risk stratify was Pedigree Assessment tool N/A  Cervical cancer screening: prior Pap reviewed, repeat due in 2026 Immunizations Declined influenza and Covid shots   Follow up in 1  year or sooner if indicated.    Additional problems address:  HTN -  Now hypotensive even of meds Asymptomatic - currently May be contributory to her intermittent dizziness Has not eaten or drank water today Repeat BP check improved D/C Amlodipine and monitor BP  closely at home BP parameters and goal discussed Bmet checked Adequate hydration encouraged  Anemia: May be contributory to intermittent dizziness Anemia panel checked  PreDM: A1C looked good  Vaginitis: STD screening and wet prep checked I called after visit to discuss wet prep + BV and mild yeast She does not do well with oral Metronidazole she says PV Flagyl prescribed Advised one dose of Diflucan after treatment completion I will contact her soon with her STD test results  Contraceptive management: Birth control discussed She declined any form or pharmacologic agents She says if she gets pregnant, she gets pregnant Use Condom regularly Upreg checked today in the setting of irregular period Neg Upreg  Janit Pagan, MD [facility]

## 2023-09-07 NOTE — Patient Instructions (Signed)
Vaginitis  Vaginitis is irritation and swelling of the vagina. Treatment will depend on the cause. What are the causes? It can be caused by: Bacteria. Yeast. A parasite. A virus. Low hormone levels. Bubble baths, scented tampons, and feminine sprays. Other things can change the balance of the yeast and bacteria that live in the vagina. These include: Antibiotic medicines. Not being clean enough. Some birth control methods. Sex. Infection. Diabetes. A weakened body defense system (immune system). What increases the risk? Smoking or being around someone who smokes. Using washes (douches), scented tampons, or scented pads. Wearing tight pants or thong underwear. Using birth control pills or an IUD. Having sex without a condom or having a lot of partners. Having an STI. Using a certain product to kill sperm (nonoxynol-9). Eating foods that are high in sugar. Having diabetes. Having low levels of a female hormone. Having a weakened body defense system. Being pregnant or breastfeeding. What are the signs or symptoms? Fluid coming from the vagina that is not normal. A bad smell. Itching, pain, or swelling. Pain with sex. Pain or burning when you pee (urinate). Sometimes there are no symptoms. How is this treated? Treatment may include: Antibiotic creams or pills. Antifungal medicines. Medicines to ease symptoms if you have a virus. Your sex partner should also be treated. Estrogen medicines. Avoiding scented soaps, sprays, or douches. Stopping use of products that caused irritation and then using a cream to treat symptoms. Follow these instructions at home: Lifestyle Keep the area around your vagina clean and dry. Avoid using soap. Rinse the area with water. Until your doctor says it is okay: Do not use washes for the vagina. Do not use tampons. Do not have sex. Wipe from front to back after going to the bathroom. When your doctor says it is okay, practice safe sex  and use condoms. General instructions Take over-the-counter and prescription medicines only as told by your doctor. If you were prescribed an antibiotic medicine, take or use it as told by your doctor. Do not stop taking or using it even if you start to feel better. Keep all follow-up visits. How is this prevented? Do not use things that can irritate the vagina, such as fabric softeners. Avoid these products if they are scented: Sprays. Detergents. Tampons. Products for cleaning the vagina. Soaps or bubble baths. Let air reach your vagina. To do this: Wear cotton underwear. Do not wear: Underwear while you sleep. Tight pants. Thong underwear. Underwear or nylons without a cotton panel. Take off any wet clothing, such as bathing suits, as soon as you can. Practice safe sex and use condoms. Contact a doctor if: You have pain in your belly or in the area between your hips. You have a fever or chills. Your symptoms last for more than 2-3 days. Get help right away if: You have a fever and your symptoms get worse all of a sudden. Summary Vaginitis is irritation and swelling of the vagina. Treatment will depend on the cause of the condition. Do not use washes or tampons or have sex until your doctor says it is okay. This information is not intended to replace advice given to you by your health care provider. Make sure you discuss any questions you have with your health care provider. Document Revised: 06/03/2020 Document Reviewed: 06/03/2020 Elsevier Patient Education  2024 [employer].

## 2023-09-07 NOTE — Assessment & Plan Note (Addendum)
Now hypotensive even of meds Asymptomatic - currently Has not eaten or drank water today Repeat BP check improved D/C Amlodipine and monitor BP closely at home BP parameters and goal discussed Bmet checked Adequate hydration encouraged

## 2023-09-07 NOTE — Assessment & Plan Note (Signed)
May be contributory to intermittent dizziness Anemia panel checked

## 2023-09-08 LAB — ANEMIA PROFILE B
Basophils Absolute: 0 10*3/uL (ref 0.0–0.2)
Basos: 1 %
EOS (ABSOLUTE): 0.1 10*3/uL (ref 0.0–0.4)
Eos: 1 %
Ferritin: 60 ng/mL (ref 15–150)
Folate: 11.2 ng/mL (ref >3.0)
Hematocrit: 38.1 % (ref 34.0–46.6)
Hemoglobin: 12.2 g/dL (ref 11.1–15.9)
Immature Grans (Abs): 0 10*3/uL (ref 0.0–0.1)
Immature Granulocytes: 0 %
Iron Saturation: 20 % (ref 15–55)
Iron: 64 ug/dL (ref 27–159)
Lymphocytes Absolute: 2.1 10*3/uL (ref 0.7–3.1)
Lymphs: 36 %
MCH: 29.5 pg (ref 26.6–33.0)
MCHC: 32 g/dL (ref 31.5–35.7)
MCV: 92 fL (ref 79–97)
Monocytes Absolute: 0.4 10*3/uL (ref 0.1–0.9)
Monocytes: 6 %
Neutrophils Absolute: 3.2 10*3/uL (ref 1.4–7.0)
Neutrophils: 56 %
Platelets: 288 10*3/uL (ref 150–450)
RBC: 4.13 x10E6/uL (ref 3.77–5.28)
RDW: 13.2 % (ref 11.7–15.4)
Retic Ct Pct: 1.1 % (ref 0.6–2.6)
Total Iron Binding Capacity: 321 ug/dL (ref 250–450)
UIBC: 257 ug/dL (ref 131–425)
Vitamin B-12: 681 pg/mL (ref 232–1245)
WBC: 5.8 10*3/uL (ref 3.4–10.8)

## 2023-09-08 LAB — BASIC METABOLIC PANEL
BUN/Creatinine Ratio: 14 (ref 9–23)
BUN: 13 mg/dL (ref 6–20)
CO2: 22 mmol/L (ref 20–29)
Calcium: 9.5 mg/dL (ref 8.7–10.2)
Chloride: 101 mmol/L (ref 96–106)
Creatinine, Ser: 0.92 mg/dL (ref 0.57–1.00)
Glucose: 71 mg/dL (ref 70–99)
Potassium: 4.3 mmol/L (ref 3.5–5.2)
Sodium: 137 mmol/L (ref 134–144)
eGFR: 82 mL/min/{1.73_m2} (ref >59)

## 2023-09-10 LAB — CERVICOVAGINAL ANCILLARY ONLY
Chlamydia: NEGATIVE
Comment: NEGATIVE
Comment: NEGATIVE
Comment: NORMAL
Neisseria Gonorrhea: NEGATIVE
Trichomonas: NEGATIVE

## 2023-09-11 ENCOUNTER — Telehealth: Payer: Self-pay | Admitting: Family Medicine

## 2023-09-11 NOTE — Telephone Encounter (Signed)
HIPAA compliant callback message left.  She was confused about her prescription based on the message I received. Please advise her when she calls:  She has bacterial vaginosis and mild yeast infection. She requested vaginal cream for the bacterial vaginosis instead of tablet. Hence, I sent in MetroGel for this. The pill is Diflucan for yeast infection.   Thanks  Dr. Bea Laura

## 2024-12-15 ENCOUNTER — Emergency Department (HOSPITAL_BASED_OUTPATIENT_CLINIC_OR_DEPARTMENT_OTHER)
Admission: EM | Admit: 2024-12-15 | Discharge: 2024-12-15 | Disposition: A | Discharge status: Home / Self Care | Attending: Emergency Medicine | Admitting: Emergency Medicine

## 2024-12-15 ENCOUNTER — Emergency Department (HOSPITAL_BASED_OUTPATIENT_CLINIC_OR_DEPARTMENT_OTHER): Admitting: Radiology

## 2024-12-15 ENCOUNTER — Encounter (HOSPITAL_BASED_OUTPATIENT_CLINIC_OR_DEPARTMENT_OTHER): Payer: Self-pay | Admitting: Emergency Medicine

## 2024-12-15 ENCOUNTER — Other Ambulatory Visit: Payer: Self-pay

## 2024-12-15 DIAGNOSIS — R918 Other nonspecific abnormal finding of lung field: Secondary | ICD-10-CM | POA: Diagnosis not present

## 2024-12-15 DIAGNOSIS — R0602 Shortness of breath: Secondary | ICD-10-CM | POA: Diagnosis not present

## 2024-12-15 DIAGNOSIS — R509 Fever, unspecified: Secondary | ICD-10-CM | POA: Diagnosis present

## 2024-12-15 DIAGNOSIS — J189 Pneumonia, unspecified organism: Secondary | ICD-10-CM

## 2024-12-15 DIAGNOSIS — J111 Influenza due to unidentified influenza virus with other respiratory manifestations: Secondary | ICD-10-CM | POA: Insufficient documentation

## 2024-12-15 DIAGNOSIS — J181 Lobar pneumonia, unspecified organism: Secondary | ICD-10-CM | POA: Diagnosis not present

## 2024-12-15 MED ORDER — AZITHROMYCIN 250 MG PO TABS: 250.0000 mg | ORAL_TABLET | Freq: Every day | ORAL | 0 refills | Status: DC

## 2024-12-15 MED ORDER — AMOXICILLIN 500 MG PO CAPS: 1000.0000 mg | ORAL_CAPSULE | Freq: Three times a day (TID) | ORAL | 0 refills | Status: DC

## 2024-12-15 MED ORDER — AZITHROMYCIN 250 MG PO TABS: 250.0000 mg | ORAL_TABLET | Freq: Every day | ORAL | 0 refills | Status: AC

## 2024-12-15 MED ORDER — ONDANSETRON 4 MG PO TBDP
4.0000 mg | ORAL_TABLET | Freq: Once | ORAL | Status: AC
  Administered 2024-12-15: 4 mg via ORAL
  Filled 2024-12-15: qty 1

## 2024-12-15 MED ORDER — AMOXICILLIN 500 MG PO CAPS: 1000.0000 mg | ORAL_CAPSULE | Freq: Three times a day (TID) | ORAL | 0 refills | Status: AC

## 2024-12-15 MED ORDER — ONDANSETRON 4 MG PO TBDP: 4.0000 mg | ORAL_TABLET | Freq: Three times a day (TID) | ORAL | 0 refills | Status: AC | PRN

## 2024-12-15 MED ORDER — ONDANSETRON 4 MG PO TBDP: 4.0000 mg | ORAL_TABLET | Freq: Three times a day (TID) | ORAL | 0 refills | Status: DC | PRN

## 2024-12-15 MED ORDER — KETOROLAC TROMETHAMINE 15 MG/ML IJ SOLN
15.0000 mg | Freq: Once | INTRAMUSCULAR | Status: AC
  Administered 2024-12-15: 15 mg via INTRAMUSCULAR
  Filled 2024-12-15: qty 1

## 2024-12-15 NOTE — ED Triage Notes (Signed)
 Pt c/o shob, n/v x 3 days. Family members flu +

## 2024-12-15 NOTE — ED Notes (Signed)

## 2024-12-15 NOTE — ED Provider Triage Note (Signed)
 Emergency Medicine Provider Triage Evaluation Note  Leah Walls , a 38 y.o. female  was evaluated in triage.  Pt complains of nausea and vomiting in setting of flulike illness.  Patient reports several children at home with flu.  Patient has had 3 to 4 days of bodyaches, cough, fever.  She has had nausea and vomiting since this started.  She has been trying to take over-the-counter medications, but usually vomits.  No focal abdominal pain.  Review of Systems  Positive: Nausea and vomiting, cough, fever Negative: Abdominal pain  Physical Exam  BP 109/89 (BP Location: Right Arm)   Pulse (!) 109   Temp 99.8 F (37.7 C) (Oral)   Resp 18   SpO2 100%  Gen:   Awake, no distress   Resp:  Normal effort  MSK:   Moves extremities without difficulty  Other:  Dry mucous membranes  Medical Decision Making  Medically screening exam initiated at 10:49 AM.  Appropriate orders placed.  Leah Walls was informed that the remainder of the evaluation will be completed by another provider, this initial triage assessment does not replace that evaluation, and the importance of remaining in the ED until their evaluation is complete.  Will start with Zofran  and fluid challenge while waiting, if she fails she may need IV hydration.   Desiderio Chew, PA-C 12/15/24 1051

## 2024-12-15 NOTE — Discharge Instructions (Signed)
 Please read and follow all provided instructions.  Your diagnoses today include:  1. Influenza-like illness   2. Community acquired pneumonia of left lower lobe of lung     Tests performed today include: Chest x-ray: Showed possibility of early left lower lobe pneumonia Vital signs. See below for your results today.   Medications prescribed:  Amoxicillin  - antibiotic  You have been prescribed an antibiotic medicine: take the entire course of medicine even if you are feeling better. Stopping early can cause the antibiotic not to work.  Azithromycin  - antibiotic for respiratory infection  You have been prescribed an antibiotic medicine: take the entire course of medicine even if you are feeling better. Stopping early can cause the antibiotic not to work.  Zofran  (ondansetron ) - for nausea and vomiting  Please use over-the-counter NSAID medications (ibuprofen , naproxen ) or Tylenol  (acetaminophen ) as directed on the packaging for pain -- as long as you do not have any reasons avoid these medications. Reasons to avoid NSAID medications include: weak kidneys, a history of bleeding in your stomach or gut, or uncontrolled high blood pressure or previous heart attack. Reasons to avoid Tylenol  include: liver problems or ongoing alcohol use. Never take more than 4000mg  or 8 Extra strength Tylenol  in a 24 hour period.     Take any prescribed medications only as directed.  Home care instructions:  Follow any educational materials contained in this packet. Please continue drinking plenty of fluids. Use over-the-counter cold and flu medications as needed as directed on packaging for symptom relief. You may also use ibuprofen  or tylenol  as directed on packaging for pain or fever.   BE VERY CAREFUL not to take multiple medicines containing Tylenol  (also called acetaminophen ). Doing so can lead to an overdose which can damage your liver and cause liver failure and possibly death.   Follow-up  instructions: Please follow-up with your primary care provider in the next 7 days for further evaluation of your symptoms.   Return instructions:  Please return to the Emergency Department if you experience worsening symptoms. Please return if you have a high fever greater than 101 degrees not controlled with over-the-counter medications, persistent vomiting and cannot keep down fluids, or worsening trouble breathing. Please return if you have any other emergent concerns.  Additional Information:  Your vital signs today were: BP 112/88   Pulse 89   Temp 98.9 F (37.2 C) (Temporal)   Resp 18   Wt 47.6 kg   SpO2 100%   BMI 21.21 kg/m  If your blood pressure (BP) was elevated above 135/85 this visit, please have this repeated by your doctor within one month.

## 2024-12-15 NOTE — ED Provider Notes (Signed)
 " [place] EMERGENCY DEPARTMENT AT [facility] Provider Note   CSN: [id number] Arrival date & time: 12/15/24  1005     Patient presents with: Emesis   Leah Walls is a 38 y.o. female.   Patient seen by myself on arrival in triage.  Patient complains of nausea and vomiting in setting of flulike illness.  Patient reports several children at home with flu.  Patient has had 3 to 4 days of bodyaches, cough, fever.  She has had nausea and vomiting since this started.  She has been trying to take over-the-counter medications, but usually vomits.  No focal abdominal pain.  She reports body aches including in her back.       Prior to Admission medications  Medication Sig Start Date End Date Taking? Authorizing Provider  acetaminophen  (TYLENOL ) 500 MG tablet Take 2 tablets (1,000 mg total) by mouth every 8 (eight) hours as needed (for pain). Patient not taking: Reported on 09/19/2022 11/12/21   Glanton, Valeria Ruth, MD  ferrous sulfate  (FERROUSUL) 325 (65 FE) MG tablet Take 1 tablet (325 mg total) by mouth daily with breakfast. Patient not taking: Reported on 09/07/2023 09/20/22   Anders Otto DASEN, MD  ibuprofen  (ADVIL ) 600 MG tablet Take 1 tablet (600 mg total) by mouth every 6 (six) hours as needed. Patient not taking: Reported on 09/19/2022 11/12/21   Glanton, Valeria Ruth, MD  omeprazole  (PRILOSEC) 40 MG capsule TAKE 1 CAPSULE BY MOUTH EVERY DAY Patient not taking: Reported on 09/07/2023 09/19/22   Anders Otto DASEN, MD  promethazine  (PHENERGAN ) 25 MG tablet Take 1 tablet (25 mg total) by mouth every 6 (six) hours as needed for nausea or vomiting. Patient not taking: Reported on 12/05/2018 10/25/18 08/22/19  Anyanwu, Ugonna A, MD    Allergies: Patient has no known allergies.    Review of Systems  Updated Vital Signs BP 109/89 (BP Location: Right Arm)   Pulse (!) 109   Temp 99.8 F (37.7 C) (Oral)   Resp 18   Wt 47.6 kg   SpO2 100%   BMI 21.21 kg/m   Physical  Exam Vitals and nursing note reviewed.  Constitutional:      General: She is not in acute distress.    Appearance: She is well-developed.  HENT:     Head: Normocephalic and atraumatic.     Right Ear: Tympanic membrane, ear canal and external ear normal.     Left Ear: Tympanic membrane, ear canal and external ear normal.     Nose: Nose normal.     Mouth/Throat:     Mouth: Mucous membranes are dry.  Eyes:     Conjunctiva/sclera: Conjunctivae normal.  Cardiovascular:     Rate and Rhythm: Regular rhythm. Tachycardia present.     Heart sounds: No murmur heard. Pulmonary:     Effort: No respiratory distress.     Breath sounds: No wheezing, rhonchi or rales.     Comments: Lungs clear to auscultation bilaterally. Abdominal:     Palpations: Abdomen is soft.     Tenderness: There is no abdominal tenderness. There is no guarding or rebound.  Musculoskeletal:     Cervical back: Normal range of motion and neck supple.     Right lower leg: No edema.     Left lower leg: No edema.  Skin:    General: Skin is warm and dry.     Findings: No rash.  Neurological:     General: No focal deficit present.  Mental Status: She is alert. Mental status is at baseline.     Motor: No weakness.  Psychiatric:        Mood and Affect: Mood normal.     (all labs ordered are listed, but only abnormal results are displayed) Labs Reviewed - No data to display  EKG: None  Radiology: DG Chest 2 View Result Date: 12/15/2024 EXAM: 2 VIEW(S) XRAY OF THE CHEST 12/15/2024 11:22:00 AM COMPARISON: 04/24/2021. Mild Left Lower Lobe peribronchial opacities identified, which may reflect early signs of pneumonia or atelectasis. CLINICAL HISTORY: SOB, flu FINDINGS: LUNGS AND PLEURA: No focal pulmonary opacity. No pleural effusion. No pneumothorax. Comparison to study of 04/24/2021 noted mild Left Lower Lobe peribronchial opacities, which may reflect early signs of pneumonia or atelectasis. HEART AND MEDIASTINUM: No  acute abnormality of the cardiac and mediastinal silhouettes. BONES AND SOFT TISSUES: No acute osseous abnormality. IMPRESSION: 1. Mild left lower lobe peribronchial opacities, which may represent early pneumonia or atelectasis. Electronically signed by: Waddell Calk MD 12/15/2024 01:34 PM EST RP Workstation: [id number]     Procedures   Medications Ordered in the ED  ondansetron  (ZOFRAN -ODT) disintegrating tablet 4 mg (4 mg Oral Given 12/15/24 1054)  ketorolac  (TORADOL ) 15 MG/ML injection 15 mg (15 mg Intramuscular Given 12/15/24 1453)   ED Course  Patient seen and examined. History obtained directly from patient.  She is now tolerating vitamin water.  Labs/EKG: None ordered  Imaging: Personally reviewed and interpreted chest x-ray, per radiology read mild left lower peribronchial opacities representing early pneumonia or atelectasis.  Medications/Fluids: IM Toradol  for body aches.  Most recent vital signs reviewed and are as follows: BP 109/89 (BP Location: Right Arm)   Pulse (!) 109   Temp 99.8 F (37.7 C) (Oral)   Resp 18   Wt 47.6 kg   SpO2 100%   BMI 21.21 kg/m   Initial impression: Influenza-like illness, out of window for Tamiflu .  She has question of pneumonia on chest x-ray and therefore will cover with this with azithromycin  and amoxicillin .  Will treat symptomatically.  Encouraged rest and hydration.  Awaiting vital sign recheck.  4:11 PM   Plan: Discharge to home.   Prescriptions written for: Azithromycin , amoxicillin , Zofran   Other home care instructions discussed: Rest, hydration, close monitoring of symptoms  ED return instructions discussed: Return with persistent vomiting, worsening shortness of breath, new or worsening symptoms  Follow-up instructions discussed: Patient encouraged to follow-up with their PCP in 5 days if not improving.                                  Medical Decision Making Amount and/or Complexity of Data Reviewed Radiology:  ordered.  Risk Prescription drug management.   Patient presents with flulike symptoms.  Given chest pain and shortness of breath, chest x-ray was obtained, raises suspicion for left basilar pneumonia.  Will cover for bacterial pneumonia.  Patient is well-appearing.  Vital signs are stable, fevers controlled.  No clinical signs and symptoms of significant dehydration and is currently tolerating fluids.  Low concern for sepsis.  In addition, low clinical concern for mononucleosis.   Given well appearance, supportive therapy indicated currently.  Discussed signs and symptoms to return with patient at bedside.  Patient seems reliable to return as needed.       Final diagnoses:  Influenza-like illness  Community acquired pneumonia of left lower lobe of lung    ED Discharge Orders  Ordered    amoxicillin  (AMOXIL ) 500 MG capsule  3 times daily,   Status:  Discontinued        12/15/24 1501    azithromycin  (ZITHROMAX ) 250 MG tablet  Daily,   Status:  Discontinued        12/15/24 1501    ondansetron  (ZOFRAN -ODT) 4 MG disintegrating tablet  Every 8 hours PRN,   Status:  Discontinued        12/15/24 1503    amoxicillin  (AMOXIL ) 500 MG capsule  3 times daily        12/15/24 1523    azithromycin  (ZITHROMAX ) 250 MG tablet  Daily        12/15/24 1523    ondansetron  (ZOFRAN -ODT) 4 MG disintegrating tablet  Every 8 hours PRN        12/15/24 1523               Desiderio Chew, PA-C 12/15/24 1613  "

## 2024-12-16 ENCOUNTER — Ambulatory Visit: Admitting: Family Medicine

## 2024-12-16 ENCOUNTER — Encounter: Payer: Self-pay | Admitting: Family Medicine

## 2024-12-16 VITALS — BP 102/79 | HR 94 | Ht 59.0 in | Wt 96.2 lb

## 2024-12-16 DIAGNOSIS — J111 Influenza due to unidentified influenza virus with other respiratory manifestations: Secondary | ICD-10-CM | POA: Diagnosis not present

## 2024-12-16 MED ORDER — ALBUTEROL SULFATE HFA 108 (90 BASE) MCG/ACT IN AERS: 2.0000 | INHALATION_SPRAY | Freq: Four times a day (QID) | RESPIRATORY_TRACT | 0 refills | Status: AC | PRN

## 2024-12-16 MED ORDER — NAPROXEN 500 MG PO TABS: 500.0000 mg | ORAL_TABLET | Freq: Two times a day (BID) | ORAL | 0 refills | Status: AC | PRN

## 2024-12-16 NOTE — Progress Notes (Addendum)
" ° ° °  SUBJECTIVE:   CHIEF COMPLAINT: flu  HPI:   Leah Walls is a pleasant 38 yo presenting for influenza like symptoms. She was seen in ED yesterday, given antibiotics for superimposed pneumonia and Zofran . Has had 5 days of symptoms. Multiple family members tested positive for influenza. She would like a test today. Reports her symptoms are slightly improved. Continues to have cough, body aches, headaches, feels winded easily. Has R sided chest pain when coughing. No more vomiting, nausea in improved, no syncope, central chest pressure.  She is taking antibiotics as prescribed.   PERTINENT  PMH / PSH/Family/Social History : no history of asthma  OBJECTIVE:   BP 102/79   Pulse 94   Ht 4' 11 (1.499 m)   Wt 96 lb 3.2 oz (43.6 kg)   SpO2 99%   BMI 19.43 kg/m   Today's weight:  Last Weight  Most recent update: 12/16/2024  8:55 AM    Weight  43.6 kg (96 lb 3.2 oz)            Review of prior weights: [employer]   12/16/24 0855  Weight: 96 lb 3.2 oz (43.6 kg)    Tired but overall well appearing No distress RRR without murmur rubs or gallops Lungs clear bilaterally Ambulates easily in room with pulse oximetry >95% entire time, HR in high 90s   ASSESSMENT/PLAN:   Assessment & Plan Influenza-like illness Discussed that she most likely has influenza given symptoms, positive sick contacts Discussed she is outside window for Tamiflu  We do not have flu test available this AM Continue antibiotics Naproxen  for muscle aches Albuterol  as needed for wheezing 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

## 2024-12-16 NOTE — Patient Instructions (Signed)
 It was wonderful to see you today.  Please bring ALL of your medications with you to every visit.   Today we talked about:   I am sorry you likely have the flu  Your oxygen level looks good   I sent in naproxen  to take twice per day with food  I sent in an inhaler to use as needed for wheezing   Go to the Emergency Room If you cannot tolerate fluids Have worsening breathing or chest pain    Please follow up in 3 months   Thank you for choosing [facility].   Please call [phone] with any questions about today's appointment.  Please be sure to schedule follow up at the front  desk before you leave today.   Suzann Daring, MD  Family Medicine
